# Patient Record
Sex: Male | Born: 1945 | Race: White | Hispanic: No | Marital: Married | Smoking: Former smoker
Health system: Southern US, Community
[De-identification: ages and names within clinical notes are randomized; demographics above are authoritative.]

## PROBLEM LIST (undated history)

## (undated) DIAGNOSIS — G43909 Migraine, unspecified, not intractable, without status migrainosus: Secondary | ICD-10-CM

## (undated) DIAGNOSIS — M1990 Unspecified osteoarthritis, unspecified site: Secondary | ICD-10-CM

## (undated) DIAGNOSIS — F32A Depression, unspecified: Secondary | ICD-10-CM

## (undated) DIAGNOSIS — F329 Major depressive disorder, single episode, unspecified: Secondary | ICD-10-CM

## (undated) DIAGNOSIS — Z87442 Personal history of urinary calculi: Secondary | ICD-10-CM

## (undated) DIAGNOSIS — E119 Type 2 diabetes mellitus without complications: Secondary | ICD-10-CM

## (undated) DIAGNOSIS — R51 Headache: Secondary | ICD-10-CM

## (undated) DIAGNOSIS — J189 Pneumonia, unspecified organism: Secondary | ICD-10-CM

## (undated) DIAGNOSIS — K219 Gastro-esophageal reflux disease without esophagitis: Secondary | ICD-10-CM

## (undated) DIAGNOSIS — N183 Chronic kidney disease, stage 3 unspecified: Secondary | ICD-10-CM

## (undated) DIAGNOSIS — F419 Anxiety disorder, unspecified: Secondary | ICD-10-CM

## (undated) DIAGNOSIS — N189 Chronic kidney disease, unspecified: Secondary | ICD-10-CM

## (undated) HISTORY — DX: Migraine, unspecified, not intractable, without status migrainosus: G43.909

## (undated) HISTORY — DX: Type 2 diabetes mellitus without complications: E11.9

## (undated) HISTORY — PX: CHOLECYSTECTOMY: SHX55

## (undated) HISTORY — DX: Chronic kidney disease, stage 3 unspecified: N18.30

## (undated) HISTORY — PX: VASECTOMY: SHX75

## (undated) HISTORY — PX: OTHER SURGICAL HISTORY: SHX169

---

## 2000-10-21 ENCOUNTER — Encounter: Payer: Self-pay | Admitting: Urology

## 2000-10-21 ENCOUNTER — Ambulatory Visit (HOSPITAL_COMMUNITY): Admission: RE | Admit: 2000-10-21 | Discharge: 2000-10-21 | Payer: Self-pay | Admitting: Urology

## 2000-10-23 ENCOUNTER — Encounter: Payer: Self-pay | Admitting: Urology

## 2000-10-23 ENCOUNTER — Ambulatory Visit (HOSPITAL_COMMUNITY): Admission: RE | Admit: 2000-10-23 | Discharge: 2000-10-23 | Payer: Self-pay | Admitting: Urology

## 2010-06-14 ENCOUNTER — Inpatient Hospital Stay (HOSPITAL_COMMUNITY)
Admission: EM | Admit: 2010-06-14 | Discharge: 2010-06-18 | Payer: Self-pay | Source: Home / Self Care | Attending: Internal Medicine | Admitting: Internal Medicine

## 2010-06-15 ENCOUNTER — Encounter: Payer: Self-pay | Admitting: Internal Medicine

## 2010-09-17 LAB — CBC
HCT: 41.5 % (ref 39.0–52.0)
HCT: 43.1 % (ref 39.0–52.0)
HCT: 46.1 % (ref 39.0–52.0)
Hemoglobin: 13.5 g/dL (ref 13.0–17.0)
Hemoglobin: 13.7 g/dL (ref 13.0–17.0)
Hemoglobin: 15.2 g/dL (ref 13.0–17.0)
Hemoglobin: 15.7 g/dL (ref 13.0–17.0)
MCH: 29.7 pg (ref 26.0–34.0)
MCH: 29.7 pg (ref 26.0–34.0)
MCH: 30.3 pg (ref 26.0–34.0)
MCHC: 32.9 g/dL (ref 30.0–36.0)
MCHC: 32.9 g/dL (ref 30.0–36.0)
MCHC: 34.1 g/dL (ref 30.0–36.0)
MCHC: 34.2 g/dL (ref 30.0–36.0)
MCV: 89 fL (ref 78.0–100.0)
MCV: 90.2 fL (ref 78.0–100.0)
Platelets: 195 10*3/uL (ref 150–400)
RBC: 4.61 MIL/uL (ref 4.22–5.81)
RBC: 5.18 MIL/uL (ref 4.22–5.81)
RDW: 13.5 % (ref 11.5–15.5)
RDW: 13.6 % (ref 11.5–15.5)
RDW: 13.8 % (ref 11.5–15.5)
RDW: 13.8 % (ref 11.5–15.5)
WBC: 7.7 10*3/uL (ref 4.0–10.5)
WBC: 9.2 10*3/uL (ref 4.0–10.5)

## 2010-09-17 LAB — BASIC METABOLIC PANEL
BUN: 15 mg/dL (ref 6–23)
BUN: 16 mg/dL (ref 6–23)
CO2: 23 mEq/L (ref 19–32)
Calcium: 8.3 mg/dL — ABNORMAL LOW (ref 8.4–10.5)
Calcium: 8.8 mg/dL (ref 8.4–10.5)
Chloride: 105 mEq/L (ref 96–112)
Chloride: 105 mEq/L (ref 96–112)
GFR calc Af Amer: 54 mL/min — ABNORMAL LOW (ref >60)
GFR calc non Af Amer: 44 mL/min — ABNORMAL LOW (ref >60)
Glucose, Bld: 110 mg/dL — ABNORMAL HIGH (ref 70–99)
Glucose, Bld: 94 mg/dL (ref 70–99)
Potassium: 3.6 mEq/L (ref 3.5–5.1)
Potassium: 3.7 mEq/L (ref 3.5–5.1)
Sodium: 141 mEq/L (ref 135–145)
Sodium: 141 mEq/L (ref 135–145)

## 2010-09-17 LAB — HEPARIN LEVEL (UNFRACTIONATED)
Heparin Unfractionated: 0.22 IU/mL — ABNORMAL LOW (ref 0.30–0.70)
Heparin Unfractionated: 0.33 IU/mL (ref 0.30–0.70)
Heparin Unfractionated: 0.57 IU/mL (ref 0.30–0.70)

## 2010-09-17 LAB — DIFFERENTIAL
Basophils Relative: 1 % (ref 0–1)
Eosinophils Absolute: 0.3 10*3/uL (ref 0.0–0.7)
Lymphs Abs: 2 10*3/uL (ref 0.7–4.0)
Monocytes Absolute: 0.5 10*3/uL (ref 0.1–1.0)
Monocytes Relative: 7 % (ref 3–12)
Neutrophils Relative %: 63 % (ref 43–77)

## 2010-09-17 LAB — CARDIAC PANEL(CRET KIN+CKTOT+MB+TROPI)
Relative Index: INVALID (ref 0.0–2.5)
Total CK: 60 U/L (ref 7–232)
Total CK: 64 U/L (ref 7–232)
Troponin I: 0.02 ng/mL (ref 0.00–0.06)

## 2010-09-17 LAB — GLUCOSE, CAPILLARY
Glucose-Capillary: 112 mg/dL — ABNORMAL HIGH (ref 70–99)
Glucose-Capillary: 117 mg/dL — ABNORMAL HIGH (ref 70–99)
Glucose-Capillary: 124 mg/dL — ABNORMAL HIGH (ref 70–99)
Glucose-Capillary: 125 mg/dL — ABNORMAL HIGH (ref 70–99)
Glucose-Capillary: 146 mg/dL — ABNORMAL HIGH (ref 70–99)
Glucose-Capillary: 151 mg/dL — ABNORMAL HIGH (ref 70–99)
Glucose-Capillary: 171 mg/dL — ABNORMAL HIGH (ref 70–99)
Glucose-Capillary: 95 mg/dL (ref 70–99)

## 2010-09-17 LAB — COMPREHENSIVE METABOLIC PANEL
ALT: UNDETERMINED U/L (ref 0–53)
AST: UNDETERMINED U/L (ref 0–37)
Albumin: 4 g/dL (ref 3.5–5.2)
Alkaline Phosphatase: 64 U/L (ref 39–117)
Calcium: 9.2 mg/dL (ref 8.4–10.5)
GFR calc Af Amer: 53 mL/min — ABNORMAL LOW (ref >60)
Glucose, Bld: UNDETERMINED mg/dL (ref 70–99)
Potassium: 4.2 mEq/L (ref 3.5–5.1)
Sodium: 142 mEq/L (ref 135–145)
Total Protein: 7.3 g/dL (ref 6.0–8.3)

## 2010-09-17 LAB — CK TOTAL AND CKMB (NOT AT ARMC)
CK, MB: 0.9 ng/mL (ref 0.3–4.0)
Relative Index: INVALID (ref 0.0–2.5)
Total CK: 77 U/L (ref 7–232)

## 2010-09-17 LAB — HEMOGLOBIN A1C
Hgb A1c MFr Bld: 6.3 % — ABNORMAL HIGH (ref <5.7)
Mean Plasma Glucose: 134 mg/dL — ABNORMAL HIGH (ref <117)

## 2010-09-17 LAB — PROTIME-INR: INR: 0.98 (ref 0.00–1.49)

## 2011-07-08 DIAGNOSIS — Z8701 Personal history of pneumonia (recurrent): Secondary | ICD-10-CM

## 2011-07-08 HISTORY — DX: Personal history of pneumonia (recurrent): Z87.01

## 2011-09-12 DIAGNOSIS — M109 Gout, unspecified: Secondary | ICD-10-CM | POA: Diagnosis not present

## 2011-10-06 DIAGNOSIS — E7800 Pure hypercholesterolemia, unspecified: Secondary | ICD-10-CM | POA: Diagnosis not present

## 2011-10-06 DIAGNOSIS — IMO0001 Reserved for inherently not codable concepts without codable children: Secondary | ICD-10-CM | POA: Diagnosis not present

## 2011-10-10 DIAGNOSIS — E781 Pure hyperglyceridemia: Secondary | ICD-10-CM | POA: Diagnosis not present

## 2011-10-10 DIAGNOSIS — G619 Inflammatory polyneuropathy, unspecified: Secondary | ICD-10-CM | POA: Diagnosis not present

## 2011-10-10 DIAGNOSIS — G622 Polyneuropathy due to other toxic agents: Secondary | ICD-10-CM | POA: Diagnosis not present

## 2011-10-10 DIAGNOSIS — M1990 Unspecified osteoarthritis, unspecified site: Secondary | ICD-10-CM | POA: Diagnosis not present

## 2011-10-10 DIAGNOSIS — E109 Type 1 diabetes mellitus without complications: Secondary | ICD-10-CM | POA: Diagnosis not present

## 2011-10-10 DIAGNOSIS — E7800 Pure hypercholesterolemia, unspecified: Secondary | ICD-10-CM | POA: Diagnosis not present

## 2011-10-21 DIAGNOSIS — M6530 Trigger finger, unspecified finger: Secondary | ICD-10-CM | POA: Diagnosis not present

## 2011-10-21 DIAGNOSIS — L02219 Cutaneous abscess of trunk, unspecified: Secondary | ICD-10-CM | POA: Diagnosis not present

## 2011-10-21 DIAGNOSIS — L03319 Cellulitis of trunk, unspecified: Secondary | ICD-10-CM | POA: Diagnosis not present

## 2011-10-21 DIAGNOSIS — M1990 Unspecified osteoarthritis, unspecified site: Secondary | ICD-10-CM | POA: Diagnosis not present

## 2012-02-05 DIAGNOSIS — E7800 Pure hypercholesterolemia, unspecified: Secondary | ICD-10-CM | POA: Diagnosis not present

## 2012-02-05 DIAGNOSIS — N400 Enlarged prostate without lower urinary tract symptoms: Secondary | ICD-10-CM | POA: Diagnosis not present

## 2012-02-20 DIAGNOSIS — G619 Inflammatory polyneuropathy, unspecified: Secondary | ICD-10-CM | POA: Diagnosis not present

## 2012-02-20 DIAGNOSIS — E781 Pure hyperglyceridemia: Secondary | ICD-10-CM | POA: Diagnosis not present

## 2012-02-20 DIAGNOSIS — E7800 Pure hypercholesterolemia, unspecified: Secondary | ICD-10-CM | POA: Diagnosis not present

## 2012-03-02 DIAGNOSIS — E119 Type 2 diabetes mellitus without complications: Secondary | ICD-10-CM | POA: Diagnosis not present

## 2012-03-24 DIAGNOSIS — M1710 Unilateral primary osteoarthritis, unspecified knee: Secondary | ICD-10-CM | POA: Diagnosis not present

## 2012-03-24 DIAGNOSIS — M25569 Pain in unspecified knee: Secondary | ICD-10-CM | POA: Diagnosis not present

## 2012-03-30 DIAGNOSIS — M6750 Plica syndrome, unspecified knee: Secondary | ICD-10-CM | POA: Diagnosis not present

## 2012-03-30 DIAGNOSIS — M8560 Other cyst of bone, unspecified site: Secondary | ICD-10-CM | POA: Diagnosis not present

## 2012-03-30 DIAGNOSIS — M1120 Other chondrocalcinosis, unspecified site: Secondary | ICD-10-CM | POA: Diagnosis not present

## 2012-03-30 DIAGNOSIS — M25569 Pain in unspecified knee: Secondary | ICD-10-CM | POA: Diagnosis not present

## 2012-03-30 DIAGNOSIS — M898X9 Other specified disorders of bone, unspecified site: Secondary | ICD-10-CM | POA: Diagnosis not present

## 2012-04-01 DIAGNOSIS — M1710 Unilateral primary osteoarthritis, unspecified knee: Secondary | ICD-10-CM | POA: Diagnosis not present

## 2012-04-01 DIAGNOSIS — M23305 Other meniscus derangements, unspecified medial meniscus, unspecified knee: Secondary | ICD-10-CM | POA: Diagnosis not present

## 2012-04-01 DIAGNOSIS — M25569 Pain in unspecified knee: Secondary | ICD-10-CM | POA: Diagnosis not present

## 2012-04-01 DIAGNOSIS — M23302 Other meniscus derangements, unspecified lateral meniscus, unspecified knee: Secondary | ICD-10-CM | POA: Diagnosis not present

## 2012-04-05 DIAGNOSIS — Z79899 Other long term (current) drug therapy: Secondary | ICD-10-CM | POA: Diagnosis not present

## 2012-04-05 DIAGNOSIS — IMO0002 Reserved for concepts with insufficient information to code with codable children: Secondary | ICD-10-CM | POA: Diagnosis not present

## 2012-04-05 DIAGNOSIS — G589 Mononeuropathy, unspecified: Secondary | ICD-10-CM | POA: Diagnosis not present

## 2012-04-05 DIAGNOSIS — M23302 Other meniscus derangements, unspecified lateral meniscus, unspecified knee: Secondary | ICD-10-CM | POA: Diagnosis not present

## 2012-04-05 DIAGNOSIS — E785 Hyperlipidemia, unspecified: Secondary | ICD-10-CM | POA: Diagnosis not present

## 2012-04-05 DIAGNOSIS — K219 Gastro-esophageal reflux disease without esophagitis: Secondary | ICD-10-CM | POA: Diagnosis not present

## 2012-04-05 DIAGNOSIS — M23305 Other meniscus derangements, unspecified medial meniscus, unspecified knee: Secondary | ICD-10-CM | POA: Diagnosis not present

## 2012-04-05 DIAGNOSIS — M9420 Chondromalacia, unspecified site: Secondary | ICD-10-CM | POA: Diagnosis not present

## 2012-04-05 DIAGNOSIS — E119 Type 2 diabetes mellitus without complications: Secondary | ICD-10-CM | POA: Diagnosis not present

## 2012-04-06 DIAGNOSIS — M1710 Unilateral primary osteoarthritis, unspecified knee: Secondary | ICD-10-CM | POA: Diagnosis not present

## 2012-04-06 DIAGNOSIS — M23302 Other meniscus derangements, unspecified lateral meniscus, unspecified knee: Secondary | ICD-10-CM | POA: Diagnosis not present

## 2012-04-06 DIAGNOSIS — E785 Hyperlipidemia, unspecified: Secondary | ICD-10-CM | POA: Diagnosis not present

## 2012-04-06 DIAGNOSIS — E119 Type 2 diabetes mellitus without complications: Secondary | ICD-10-CM | POA: Diagnosis not present

## 2012-04-06 DIAGNOSIS — G589 Mononeuropathy, unspecified: Secondary | ICD-10-CM | POA: Diagnosis not present

## 2012-04-06 DIAGNOSIS — K219 Gastro-esophageal reflux disease without esophagitis: Secondary | ICD-10-CM | POA: Diagnosis not present

## 2012-04-06 DIAGNOSIS — M23305 Other meniscus derangements, unspecified medial meniscus, unspecified knee: Secondary | ICD-10-CM | POA: Diagnosis not present

## 2012-04-06 DIAGNOSIS — IMO0002 Reserved for concepts with insufficient information to code with codable children: Secondary | ICD-10-CM | POA: Diagnosis not present

## 2012-04-06 DIAGNOSIS — Z79899 Other long term (current) drug therapy: Secondary | ICD-10-CM | POA: Diagnosis not present

## 2012-04-06 DIAGNOSIS — S83289A Other tear of lateral meniscus, current injury, unspecified knee, initial encounter: Secondary | ICD-10-CM | POA: Diagnosis not present

## 2012-04-26 DIAGNOSIS — Z23 Encounter for immunization: Secondary | ICD-10-CM | POA: Diagnosis not present

## 2012-06-16 DIAGNOSIS — E7800 Pure hypercholesterolemia, unspecified: Secondary | ICD-10-CM | POA: Diagnosis not present

## 2012-06-22 DIAGNOSIS — G619 Inflammatory polyneuropathy, unspecified: Secondary | ICD-10-CM | POA: Diagnosis not present

## 2012-06-22 DIAGNOSIS — E7800 Pure hypercholesterolemia, unspecified: Secondary | ICD-10-CM | POA: Diagnosis not present

## 2012-06-22 DIAGNOSIS — E781 Pure hyperglyceridemia: Secondary | ICD-10-CM | POA: Diagnosis not present

## 2012-06-22 DIAGNOSIS — G622 Polyneuropathy due to other toxic agents: Secondary | ICD-10-CM | POA: Diagnosis not present

## 2012-10-20 DIAGNOSIS — E7800 Pure hypercholesterolemia, unspecified: Secondary | ICD-10-CM | POA: Diagnosis not present

## 2012-10-20 DIAGNOSIS — IMO0001 Reserved for inherently not codable concepts without codable children: Secondary | ICD-10-CM | POA: Diagnosis not present

## 2012-10-27 DIAGNOSIS — E781 Pure hyperglyceridemia: Secondary | ICD-10-CM | POA: Diagnosis not present

## 2012-10-27 DIAGNOSIS — G619 Inflammatory polyneuropathy, unspecified: Secondary | ICD-10-CM | POA: Diagnosis not present

## 2012-10-27 DIAGNOSIS — E7800 Pure hypercholesterolemia, unspecified: Secondary | ICD-10-CM | POA: Diagnosis not present

## 2012-10-27 DIAGNOSIS — G622 Polyneuropathy due to other toxic agents: Secondary | ICD-10-CM | POA: Diagnosis not present

## 2012-11-24 DIAGNOSIS — Z9889 Other specified postprocedural states: Secondary | ICD-10-CM | POA: Diagnosis not present

## 2012-11-24 DIAGNOSIS — M23305 Other meniscus derangements, unspecified medial meniscus, unspecified knee: Secondary | ICD-10-CM | POA: Diagnosis not present

## 2012-11-24 DIAGNOSIS — M25569 Pain in unspecified knee: Secondary | ICD-10-CM | POA: Diagnosis not present

## 2012-11-24 DIAGNOSIS — M25469 Effusion, unspecified knee: Secondary | ICD-10-CM | POA: Diagnosis not present

## 2013-02-25 DIAGNOSIS — E7800 Pure hypercholesterolemia, unspecified: Secondary | ICD-10-CM | POA: Diagnosis not present

## 2013-02-25 DIAGNOSIS — G619 Inflammatory polyneuropathy, unspecified: Secondary | ICD-10-CM | POA: Diagnosis not present

## 2013-03-01 DIAGNOSIS — N529 Male erectile dysfunction, unspecified: Secondary | ICD-10-CM | POA: Diagnosis not present

## 2013-03-01 DIAGNOSIS — E781 Pure hyperglyceridemia: Secondary | ICD-10-CM | POA: Diagnosis not present

## 2013-03-01 DIAGNOSIS — G619 Inflammatory polyneuropathy, unspecified: Secondary | ICD-10-CM | POA: Diagnosis not present

## 2013-03-01 DIAGNOSIS — E7800 Pure hypercholesterolemia, unspecified: Secondary | ICD-10-CM | POA: Diagnosis not present

## 2013-03-01 DIAGNOSIS — Z23 Encounter for immunization: Secondary | ICD-10-CM | POA: Diagnosis not present

## 2013-03-17 DIAGNOSIS — E119 Type 2 diabetes mellitus without complications: Secondary | ICD-10-CM | POA: Diagnosis not present

## 2013-03-31 DIAGNOSIS — M6530 Trigger finger, unspecified finger: Secondary | ICD-10-CM | POA: Diagnosis not present

## 2013-04-07 DIAGNOSIS — Z23 Encounter for immunization: Secondary | ICD-10-CM | POA: Diagnosis not present

## 2013-04-08 DIAGNOSIS — G589 Mononeuropathy, unspecified: Secondary | ICD-10-CM | POA: Diagnosis not present

## 2013-04-08 DIAGNOSIS — E785 Hyperlipidemia, unspecified: Secondary | ICD-10-CM | POA: Diagnosis not present

## 2013-04-08 DIAGNOSIS — M65839 Other synovitis and tenosynovitis, unspecified forearm: Secondary | ICD-10-CM | POA: Diagnosis not present

## 2013-04-08 DIAGNOSIS — K219 Gastro-esophageal reflux disease without esophagitis: Secondary | ICD-10-CM | POA: Diagnosis not present

## 2013-04-08 DIAGNOSIS — Z8261 Family history of arthritis: Secondary | ICD-10-CM | POA: Diagnosis not present

## 2013-04-08 DIAGNOSIS — Z7982 Long term (current) use of aspirin: Secondary | ICD-10-CM | POA: Diagnosis not present

## 2013-04-08 DIAGNOSIS — M6530 Trigger finger, unspecified finger: Secondary | ICD-10-CM | POA: Diagnosis not present

## 2013-04-08 DIAGNOSIS — Z79899 Other long term (current) drug therapy: Secondary | ICD-10-CM | POA: Diagnosis not present

## 2013-04-08 DIAGNOSIS — E119 Type 2 diabetes mellitus without complications: Secondary | ICD-10-CM | POA: Diagnosis not present

## 2013-04-08 DIAGNOSIS — Z8489 Family history of other specified conditions: Secondary | ICD-10-CM | POA: Diagnosis not present

## 2013-06-27 DIAGNOSIS — E7800 Pure hypercholesterolemia, unspecified: Secondary | ICD-10-CM | POA: Diagnosis not present

## 2013-06-27 DIAGNOSIS — M1990 Unspecified osteoarthritis, unspecified site: Secondary | ICD-10-CM | POA: Diagnosis not present

## 2013-07-04 DIAGNOSIS — E7800 Pure hypercholesterolemia, unspecified: Secondary | ICD-10-CM | POA: Diagnosis not present

## 2013-07-04 DIAGNOSIS — N529 Male erectile dysfunction, unspecified: Secondary | ICD-10-CM | POA: Diagnosis not present

## 2013-07-04 DIAGNOSIS — E781 Pure hyperglyceridemia: Secondary | ICD-10-CM | POA: Diagnosis not present

## 2013-07-04 DIAGNOSIS — G619 Inflammatory polyneuropathy, unspecified: Secondary | ICD-10-CM | POA: Diagnosis not present

## 2013-09-22 DIAGNOSIS — R05 Cough: Secondary | ICD-10-CM | POA: Diagnosis not present

## 2013-09-22 DIAGNOSIS — R509 Fever, unspecified: Secondary | ICD-10-CM | POA: Diagnosis not present

## 2013-09-22 DIAGNOSIS — J40 Bronchitis, not specified as acute or chronic: Secondary | ICD-10-CM | POA: Diagnosis not present

## 2013-09-22 DIAGNOSIS — R059 Cough, unspecified: Secondary | ICD-10-CM | POA: Diagnosis not present

## 2013-11-01 DIAGNOSIS — N183 Chronic kidney disease, stage 3 unspecified: Secondary | ICD-10-CM | POA: Diagnosis not present

## 2013-11-01 DIAGNOSIS — E7800 Pure hypercholesterolemia, unspecified: Secondary | ICD-10-CM | POA: Diagnosis not present

## 2013-11-01 DIAGNOSIS — K2100 Gastro-esophageal reflux disease with esophagitis, without bleeding: Secondary | ICD-10-CM | POA: Diagnosis not present

## 2013-11-01 DIAGNOSIS — E119 Type 2 diabetes mellitus without complications: Secondary | ICD-10-CM | POA: Diagnosis not present

## 2013-11-07 DIAGNOSIS — IMO0001 Reserved for inherently not codable concepts without codable children: Secondary | ICD-10-CM | POA: Diagnosis not present

## 2013-11-07 DIAGNOSIS — G619 Inflammatory polyneuropathy, unspecified: Secondary | ICD-10-CM | POA: Diagnosis not present

## 2013-11-07 DIAGNOSIS — E7800 Pure hypercholesterolemia, unspecified: Secondary | ICD-10-CM | POA: Diagnosis not present

## 2013-11-07 DIAGNOSIS — N183 Chronic kidney disease, stage 3 unspecified: Secondary | ICD-10-CM | POA: Diagnosis not present

## 2013-11-07 DIAGNOSIS — R109 Unspecified abdominal pain: Secondary | ICD-10-CM | POA: Diagnosis not present

## 2013-11-07 DIAGNOSIS — E781 Pure hyperglyceridemia: Secondary | ICD-10-CM | POA: Diagnosis not present

## 2013-12-06 DIAGNOSIS — R3989 Other symptoms and signs involving the genitourinary system: Secondary | ICD-10-CM | POA: Diagnosis not present

## 2013-12-06 DIAGNOSIS — R3915 Urgency of urination: Secondary | ICD-10-CM | POA: Diagnosis not present

## 2013-12-06 DIAGNOSIS — N419 Inflammatory disease of prostate, unspecified: Secondary | ICD-10-CM | POA: Diagnosis not present

## 2014-01-10 DIAGNOSIS — R339 Retention of urine, unspecified: Secondary | ICD-10-CM | POA: Diagnosis not present

## 2014-01-10 DIAGNOSIS — R3989 Other symptoms and signs involving the genitourinary system: Secondary | ICD-10-CM | POA: Diagnosis not present

## 2014-01-10 DIAGNOSIS — R3915 Urgency of urination: Secondary | ICD-10-CM | POA: Diagnosis not present

## 2014-01-24 DIAGNOSIS — N419 Inflammatory disease of prostate, unspecified: Secondary | ICD-10-CM | POA: Diagnosis not present

## 2014-02-14 DIAGNOSIS — R339 Retention of urine, unspecified: Secondary | ICD-10-CM | POA: Diagnosis not present

## 2014-02-14 DIAGNOSIS — R3989 Other symptoms and signs involving the genitourinary system: Secondary | ICD-10-CM | POA: Diagnosis not present

## 2014-02-14 DIAGNOSIS — R3915 Urgency of urination: Secondary | ICD-10-CM | POA: Diagnosis not present

## 2014-02-18 DIAGNOSIS — H60399 Other infective otitis externa, unspecified ear: Secondary | ICD-10-CM | POA: Diagnosis not present

## 2014-02-18 DIAGNOSIS — H6690 Otitis media, unspecified, unspecified ear: Secondary | ICD-10-CM | POA: Diagnosis not present

## 2014-03-03 DIAGNOSIS — H6690 Otitis media, unspecified, unspecified ear: Secondary | ICD-10-CM | POA: Diagnosis not present

## 2014-03-03 DIAGNOSIS — H60399 Other infective otitis externa, unspecified ear: Secondary | ICD-10-CM | POA: Diagnosis not present

## 2014-03-15 DIAGNOSIS — H40019 Open angle with borderline findings, low risk, unspecified eye: Secondary | ICD-10-CM | POA: Diagnosis not present

## 2014-03-20 DIAGNOSIS — E7800 Pure hypercholesterolemia, unspecified: Secondary | ICD-10-CM | POA: Diagnosis not present

## 2014-03-20 DIAGNOSIS — IMO0001 Reserved for inherently not codable concepts without codable children: Secondary | ICD-10-CM | POA: Diagnosis not present

## 2014-03-20 DIAGNOSIS — N183 Chronic kidney disease, stage 3 unspecified: Secondary | ICD-10-CM | POA: Diagnosis not present

## 2014-03-22 DIAGNOSIS — M6530 Trigger finger, unspecified finger: Secondary | ICD-10-CM | POA: Diagnosis not present

## 2014-03-28 DIAGNOSIS — E781 Pure hyperglyceridemia: Secondary | ICD-10-CM | POA: Diagnosis not present

## 2014-03-28 DIAGNOSIS — G622 Polyneuropathy due to other toxic agents: Secondary | ICD-10-CM | POA: Diagnosis not present

## 2014-03-28 DIAGNOSIS — G619 Inflammatory polyneuropathy, unspecified: Secondary | ICD-10-CM | POA: Diagnosis not present

## 2014-03-28 DIAGNOSIS — Z23 Encounter for immunization: Secondary | ICD-10-CM | POA: Diagnosis not present

## 2014-03-28 DIAGNOSIS — H409 Unspecified glaucoma: Secondary | ICD-10-CM | POA: Diagnosis not present

## 2014-03-28 DIAGNOSIS — E119 Type 2 diabetes mellitus without complications: Secondary | ICD-10-CM | POA: Diagnosis not present

## 2014-03-28 DIAGNOSIS — E7800 Pure hypercholesterolemia, unspecified: Secondary | ICD-10-CM | POA: Diagnosis not present

## 2014-03-28 DIAGNOSIS — G43919 Migraine, unspecified, intractable, without status migrainosus: Secondary | ICD-10-CM | POA: Diagnosis not present

## 2014-03-28 DIAGNOSIS — N183 Chronic kidney disease, stage 3 unspecified: Secondary | ICD-10-CM | POA: Diagnosis not present

## 2014-04-05 DIAGNOSIS — H2510 Age-related nuclear cataract, unspecified eye: Secondary | ICD-10-CM | POA: Diagnosis not present

## 2014-04-05 DIAGNOSIS — H25019 Cortical age-related cataract, unspecified eye: Secondary | ICD-10-CM | POA: Diagnosis not present

## 2014-04-05 DIAGNOSIS — E119 Type 2 diabetes mellitus without complications: Secondary | ICD-10-CM | POA: Diagnosis not present

## 2014-04-05 DIAGNOSIS — H409 Unspecified glaucoma: Secondary | ICD-10-CM | POA: Diagnosis not present

## 2014-04-05 DIAGNOSIS — H4011X0 Primary open-angle glaucoma, stage unspecified: Secondary | ICD-10-CM | POA: Diagnosis not present

## 2014-05-09 DIAGNOSIS — H2512 Age-related nuclear cataract, left eye: Secondary | ICD-10-CM | POA: Diagnosis not present

## 2014-06-09 DIAGNOSIS — H2511 Age-related nuclear cataract, right eye: Secondary | ICD-10-CM | POA: Diagnosis not present

## 2014-06-09 DIAGNOSIS — H25041 Posterior subcapsular polar age-related cataract, right eye: Secondary | ICD-10-CM | POA: Diagnosis not present

## 2014-06-09 DIAGNOSIS — H25011 Cortical age-related cataract, right eye: Secondary | ICD-10-CM | POA: Diagnosis not present

## 2014-06-27 DIAGNOSIS — H2511 Age-related nuclear cataract, right eye: Secondary | ICD-10-CM | POA: Diagnosis not present

## 2014-07-10 DIAGNOSIS — E119 Type 2 diabetes mellitus without complications: Secondary | ICD-10-CM | POA: Diagnosis not present

## 2014-07-10 DIAGNOSIS — E780 Pure hypercholesterolemia: Secondary | ICD-10-CM | POA: Diagnosis not present

## 2014-07-10 DIAGNOSIS — E781 Pure hyperglyceridemia: Secondary | ICD-10-CM | POA: Diagnosis not present

## 2014-07-10 DIAGNOSIS — K210 Gastro-esophageal reflux disease with esophagitis: Secondary | ICD-10-CM | POA: Diagnosis not present

## 2014-07-10 DIAGNOSIS — N183 Chronic kidney disease, stage 3 (moderate): Secondary | ICD-10-CM | POA: Diagnosis not present

## 2014-07-17 DIAGNOSIS — E1140 Type 2 diabetes mellitus with diabetic neuropathy, unspecified: Secondary | ICD-10-CM | POA: Diagnosis not present

## 2014-07-17 DIAGNOSIS — Z1389 Encounter for screening for other disorder: Secondary | ICD-10-CM | POA: Diagnosis not present

## 2014-07-17 DIAGNOSIS — E782 Mixed hyperlipidemia: Secondary | ICD-10-CM | POA: Diagnosis not present

## 2014-07-17 DIAGNOSIS — N183 Chronic kidney disease, stage 3 (moderate): Secondary | ICD-10-CM | POA: Diagnosis not present

## 2014-07-17 DIAGNOSIS — K210 Gastro-esophageal reflux disease with esophagitis: Secondary | ICD-10-CM | POA: Diagnosis not present

## 2014-07-17 DIAGNOSIS — E1165 Type 2 diabetes mellitus with hyperglycemia: Secondary | ICD-10-CM | POA: Diagnosis not present

## 2014-07-31 DIAGNOSIS — M65351 Trigger finger, right little finger: Secondary | ICD-10-CM | POA: Diagnosis not present

## 2014-07-31 DIAGNOSIS — M65332 Trigger finger, left middle finger: Secondary | ICD-10-CM | POA: Diagnosis not present

## 2014-08-18 DIAGNOSIS — R3915 Urgency of urination: Secondary | ICD-10-CM | POA: Diagnosis not present

## 2014-08-18 DIAGNOSIS — R339 Retention of urine, unspecified: Secondary | ICD-10-CM | POA: Diagnosis not present

## 2014-08-18 DIAGNOSIS — N401 Enlarged prostate with lower urinary tract symptoms: Secondary | ICD-10-CM | POA: Diagnosis not present

## 2014-09-06 DIAGNOSIS — M65332 Trigger finger, left middle finger: Secondary | ICD-10-CM | POA: Diagnosis not present

## 2014-09-19 DIAGNOSIS — Z8261 Family history of arthritis: Secondary | ICD-10-CM | POA: Diagnosis not present

## 2014-09-19 DIAGNOSIS — E119 Type 2 diabetes mellitus without complications: Secondary | ICD-10-CM | POA: Diagnosis not present

## 2014-09-19 DIAGNOSIS — M65332 Trigger finger, left middle finger: Secondary | ICD-10-CM | POA: Diagnosis not present

## 2014-09-19 DIAGNOSIS — M1990 Unspecified osteoarthritis, unspecified site: Secondary | ICD-10-CM | POA: Diagnosis not present

## 2014-09-19 DIAGNOSIS — Z8249 Family history of ischemic heart disease and other diseases of the circulatory system: Secondary | ICD-10-CM | POA: Diagnosis not present

## 2014-09-19 DIAGNOSIS — Z79899 Other long term (current) drug therapy: Secondary | ICD-10-CM | POA: Diagnosis not present

## 2014-09-19 DIAGNOSIS — K219 Gastro-esophageal reflux disease without esophagitis: Secondary | ICD-10-CM | POA: Diagnosis not present

## 2014-09-19 DIAGNOSIS — F329 Major depressive disorder, single episode, unspecified: Secondary | ICD-10-CM | POA: Diagnosis not present

## 2014-09-19 DIAGNOSIS — G629 Polyneuropathy, unspecified: Secondary | ICD-10-CM | POA: Diagnosis not present

## 2014-09-19 DIAGNOSIS — G43909 Migraine, unspecified, not intractable, without status migrainosus: Secondary | ICD-10-CM | POA: Diagnosis not present

## 2014-09-19 DIAGNOSIS — M65842 Other synovitis and tenosynovitis, left hand: Secondary | ICD-10-CM | POA: Diagnosis not present

## 2014-09-19 DIAGNOSIS — Z87442 Personal history of urinary calculi: Secondary | ICD-10-CM | POA: Diagnosis not present

## 2014-09-19 DIAGNOSIS — Z7982 Long term (current) use of aspirin: Secondary | ICD-10-CM | POA: Diagnosis not present

## 2014-09-19 DIAGNOSIS — E669 Obesity, unspecified: Secondary | ICD-10-CM | POA: Diagnosis not present

## 2014-09-19 DIAGNOSIS — E780 Pure hypercholesterolemia: Secondary | ICD-10-CM | POA: Diagnosis not present

## 2014-09-19 DIAGNOSIS — F419 Anxiety disorder, unspecified: Secondary | ICD-10-CM | POA: Diagnosis not present

## 2014-09-19 DIAGNOSIS — Z6831 Body mass index (BMI) 31.0-31.9, adult: Secondary | ICD-10-CM | POA: Diagnosis not present

## 2014-10-23 DIAGNOSIS — J189 Pneumonia, unspecified organism: Secondary | ICD-10-CM | POA: Diagnosis not present

## 2014-11-06 DIAGNOSIS — E1140 Type 2 diabetes mellitus with diabetic neuropathy, unspecified: Secondary | ICD-10-CM | POA: Diagnosis not present

## 2014-11-06 DIAGNOSIS — E1165 Type 2 diabetes mellitus with hyperglycemia: Secondary | ICD-10-CM | POA: Diagnosis not present

## 2014-11-06 DIAGNOSIS — E782 Mixed hyperlipidemia: Secondary | ICD-10-CM | POA: Diagnosis not present

## 2014-11-06 DIAGNOSIS — N183 Chronic kidney disease, stage 3 (moderate): Secondary | ICD-10-CM | POA: Diagnosis not present

## 2014-11-06 DIAGNOSIS — K210 Gastro-esophageal reflux disease with esophagitis: Secondary | ICD-10-CM | POA: Diagnosis not present

## 2014-11-06 DIAGNOSIS — E781 Pure hyperglyceridemia: Secondary | ICD-10-CM | POA: Diagnosis not present

## 2014-11-06 DIAGNOSIS — E780 Pure hypercholesterolemia: Secondary | ICD-10-CM | POA: Diagnosis not present

## 2014-11-13 DIAGNOSIS — E1140 Type 2 diabetes mellitus with diabetic neuropathy, unspecified: Secondary | ICD-10-CM | POA: Diagnosis not present

## 2014-11-13 DIAGNOSIS — N183 Chronic kidney disease, stage 3 (moderate): Secondary | ICD-10-CM | POA: Diagnosis not present

## 2014-11-13 DIAGNOSIS — K210 Gastro-esophageal reflux disease with esophagitis: Secondary | ICD-10-CM | POA: Diagnosis not present

## 2014-11-13 DIAGNOSIS — E782 Mixed hyperlipidemia: Secondary | ICD-10-CM | POA: Diagnosis not present

## 2014-11-13 DIAGNOSIS — E1165 Type 2 diabetes mellitus with hyperglycemia: Secondary | ICD-10-CM | POA: Diagnosis not present

## 2014-11-15 DIAGNOSIS — N183 Chronic kidney disease, stage 3 (moderate): Secondary | ICD-10-CM | POA: Diagnosis not present

## 2014-11-15 DIAGNOSIS — E1165 Type 2 diabetes mellitus with hyperglycemia: Secondary | ICD-10-CM | POA: Diagnosis not present

## 2014-12-16 DIAGNOSIS — N183 Chronic kidney disease, stage 3 (moderate): Secondary | ICD-10-CM | POA: Diagnosis not present

## 2015-01-15 DIAGNOSIS — M1990 Unspecified osteoarthritis, unspecified site: Secondary | ICD-10-CM | POA: Diagnosis not present

## 2015-02-07 DIAGNOSIS — R3915 Urgency of urination: Secondary | ICD-10-CM | POA: Diagnosis not present

## 2015-02-07 DIAGNOSIS — R339 Retention of urine, unspecified: Secondary | ICD-10-CM | POA: Diagnosis not present

## 2015-02-07 DIAGNOSIS — N5203 Combined arterial insufficiency and corporo-venous occlusive erectile dysfunction: Secondary | ICD-10-CM | POA: Diagnosis not present

## 2015-02-15 DIAGNOSIS — M1990 Unspecified osteoarthritis, unspecified site: Secondary | ICD-10-CM | POA: Diagnosis not present

## 2015-03-18 DIAGNOSIS — I10 Essential (primary) hypertension: Secondary | ICD-10-CM | POA: Diagnosis not present

## 2015-03-30 DIAGNOSIS — K210 Gastro-esophageal reflux disease with esophagitis: Secondary | ICD-10-CM | POA: Diagnosis not present

## 2015-03-30 DIAGNOSIS — N183 Chronic kidney disease, stage 3 (moderate): Secondary | ICD-10-CM | POA: Diagnosis not present

## 2015-03-30 DIAGNOSIS — E1165 Type 2 diabetes mellitus with hyperglycemia: Secondary | ICD-10-CM | POA: Diagnosis not present

## 2015-03-30 DIAGNOSIS — E782 Mixed hyperlipidemia: Secondary | ICD-10-CM | POA: Diagnosis not present

## 2015-04-03 DIAGNOSIS — Z23 Encounter for immunization: Secondary | ICD-10-CM | POA: Diagnosis not present

## 2015-04-03 DIAGNOSIS — E1122 Type 2 diabetes mellitus with diabetic chronic kidney disease: Secondary | ICD-10-CM | POA: Diagnosis not present

## 2015-04-03 DIAGNOSIS — E782 Mixed hyperlipidemia: Secondary | ICD-10-CM | POA: Diagnosis not present

## 2015-04-03 DIAGNOSIS — E1165 Type 2 diabetes mellitus with hyperglycemia: Secondary | ICD-10-CM | POA: Diagnosis not present

## 2015-04-03 DIAGNOSIS — K210 Gastro-esophageal reflux disease with esophagitis: Secondary | ICD-10-CM | POA: Diagnosis not present

## 2015-04-03 DIAGNOSIS — N183 Chronic kidney disease, stage 3 (moderate): Secondary | ICD-10-CM | POA: Diagnosis not present

## 2015-04-03 DIAGNOSIS — E1140 Type 2 diabetes mellitus with diabetic neuropathy, unspecified: Secondary | ICD-10-CM | POA: Diagnosis not present

## 2015-04-17 DIAGNOSIS — F419 Anxiety disorder, unspecified: Secondary | ICD-10-CM | POA: Diagnosis not present

## 2015-05-18 DIAGNOSIS — E119 Type 2 diabetes mellitus without complications: Secondary | ICD-10-CM | POA: Diagnosis not present

## 2015-05-18 DIAGNOSIS — F419 Anxiety disorder, unspecified: Secondary | ICD-10-CM | POA: Diagnosis not present

## 2015-06-17 DIAGNOSIS — E119 Type 2 diabetes mellitus without complications: Secondary | ICD-10-CM | POA: Diagnosis not present

## 2015-07-24 DIAGNOSIS — N183 Chronic kidney disease, stage 3 (moderate): Secondary | ICD-10-CM | POA: Diagnosis not present

## 2015-07-24 DIAGNOSIS — K210 Gastro-esophageal reflux disease with esophagitis: Secondary | ICD-10-CM | POA: Diagnosis not present

## 2015-07-24 DIAGNOSIS — E7800 Pure hypercholesterolemia, unspecified: Secondary | ICD-10-CM | POA: Diagnosis not present

## 2015-07-24 DIAGNOSIS — E1122 Type 2 diabetes mellitus with diabetic chronic kidney disease: Secondary | ICD-10-CM | POA: Diagnosis not present

## 2015-07-24 DIAGNOSIS — E782 Mixed hyperlipidemia: Secondary | ICD-10-CM | POA: Diagnosis not present

## 2015-07-31 DIAGNOSIS — E1140 Type 2 diabetes mellitus with diabetic neuropathy, unspecified: Secondary | ICD-10-CM | POA: Diagnosis not present

## 2015-07-31 DIAGNOSIS — G43009 Migraine without aura, not intractable, without status migrainosus: Secondary | ICD-10-CM | POA: Diagnosis not present

## 2015-07-31 DIAGNOSIS — E1165 Type 2 diabetes mellitus with hyperglycemia: Secondary | ICD-10-CM | POA: Diagnosis not present

## 2015-07-31 DIAGNOSIS — E782 Mixed hyperlipidemia: Secondary | ICD-10-CM | POA: Diagnosis not present

## 2015-07-31 DIAGNOSIS — N183 Chronic kidney disease, stage 3 (moderate): Secondary | ICD-10-CM | POA: Diagnosis not present

## 2015-07-31 DIAGNOSIS — K210 Gastro-esophageal reflux disease with esophagitis: Secondary | ICD-10-CM | POA: Diagnosis not present

## 2015-07-31 DIAGNOSIS — E1122 Type 2 diabetes mellitus with diabetic chronic kidney disease: Secondary | ICD-10-CM | POA: Diagnosis not present

## 2015-08-21 DIAGNOSIS — N401 Enlarged prostate with lower urinary tract symptoms: Secondary | ICD-10-CM | POA: Diagnosis not present

## 2015-11-26 DIAGNOSIS — K210 Gastro-esophageal reflux disease with esophagitis: Secondary | ICD-10-CM | POA: Diagnosis not present

## 2015-11-26 DIAGNOSIS — E781 Pure hyperglyceridemia: Secondary | ICD-10-CM | POA: Diagnosis not present

## 2015-11-26 DIAGNOSIS — Z205 Contact with and (suspected) exposure to viral hepatitis: Secondary | ICD-10-CM | POA: Diagnosis not present

## 2015-11-26 DIAGNOSIS — E782 Mixed hyperlipidemia: Secondary | ICD-10-CM | POA: Diagnosis not present

## 2015-11-26 DIAGNOSIS — E1122 Type 2 diabetes mellitus with diabetic chronic kidney disease: Secondary | ICD-10-CM | POA: Diagnosis not present

## 2015-11-26 DIAGNOSIS — E7800 Pure hypercholesterolemia, unspecified: Secondary | ICD-10-CM | POA: Diagnosis not present

## 2015-11-26 DIAGNOSIS — Z0000 Encounter for general adult medical examination without abnormal findings: Secondary | ICD-10-CM | POA: Diagnosis not present

## 2015-11-26 DIAGNOSIS — N183 Chronic kidney disease, stage 3 (moderate): Secondary | ICD-10-CM | POA: Diagnosis not present

## 2015-11-29 DIAGNOSIS — E1140 Type 2 diabetes mellitus with diabetic neuropathy, unspecified: Secondary | ICD-10-CM | POA: Diagnosis not present

## 2015-11-29 DIAGNOSIS — G43009 Migraine without aura, not intractable, without status migrainosus: Secondary | ICD-10-CM | POA: Diagnosis not present

## 2015-11-29 DIAGNOSIS — E1165 Type 2 diabetes mellitus with hyperglycemia: Secondary | ICD-10-CM | POA: Diagnosis not present

## 2015-11-29 DIAGNOSIS — N183 Chronic kidney disease, stage 3 (moderate): Secondary | ICD-10-CM | POA: Diagnosis not present

## 2015-11-29 DIAGNOSIS — E1122 Type 2 diabetes mellitus with diabetic chronic kidney disease: Secondary | ICD-10-CM | POA: Diagnosis not present

## 2015-11-29 DIAGNOSIS — K210 Gastro-esophageal reflux disease with esophagitis: Secondary | ICD-10-CM | POA: Diagnosis not present

## 2015-11-29 DIAGNOSIS — Z1389 Encounter for screening for other disorder: Secondary | ICD-10-CM | POA: Diagnosis not present

## 2015-11-29 DIAGNOSIS — E782 Mixed hyperlipidemia: Secondary | ICD-10-CM | POA: Diagnosis not present

## 2016-02-06 DIAGNOSIS — H524 Presbyopia: Secondary | ICD-10-CM | POA: Diagnosis not present

## 2016-02-06 DIAGNOSIS — H401131 Primary open-angle glaucoma, bilateral, mild stage: Secondary | ICD-10-CM | POA: Diagnosis not present

## 2016-02-19 DIAGNOSIS — R3915 Urgency of urination: Secondary | ICD-10-CM | POA: Diagnosis not present

## 2016-02-19 DIAGNOSIS — R339 Retention of urine, unspecified: Secondary | ICD-10-CM | POA: Diagnosis not present

## 2016-02-19 DIAGNOSIS — N401 Enlarged prostate with lower urinary tract symptoms: Secondary | ICD-10-CM | POA: Diagnosis not present

## 2016-03-17 DIAGNOSIS — R310 Gross hematuria: Secondary | ICD-10-CM | POA: Diagnosis not present

## 2016-03-17 DIAGNOSIS — R109 Unspecified abdominal pain: Secondary | ICD-10-CM | POA: Diagnosis not present

## 2016-03-20 DIAGNOSIS — N261 Atrophy of kidney (terminal): Secondary | ICD-10-CM | POA: Diagnosis not present

## 2016-03-20 DIAGNOSIS — R109 Unspecified abdominal pain: Secondary | ICD-10-CM | POA: Diagnosis not present

## 2016-03-20 DIAGNOSIS — R310 Gross hematuria: Secondary | ICD-10-CM | POA: Diagnosis not present

## 2016-03-20 DIAGNOSIS — N132 Hydronephrosis with renal and ureteral calculous obstruction: Secondary | ICD-10-CM | POA: Diagnosis not present

## 2016-03-20 DIAGNOSIS — I700 Atherosclerosis of aorta: Secondary | ICD-10-CM | POA: Diagnosis not present

## 2016-03-20 DIAGNOSIS — N201 Calculus of ureter: Secondary | ICD-10-CM | POA: Diagnosis not present

## 2016-03-20 DIAGNOSIS — K449 Diaphragmatic hernia without obstruction or gangrene: Secondary | ICD-10-CM | POA: Diagnosis not present

## 2016-03-24 DIAGNOSIS — N201 Calculus of ureter: Secondary | ICD-10-CM | POA: Diagnosis not present

## 2016-03-24 DIAGNOSIS — R310 Gross hematuria: Secondary | ICD-10-CM | POA: Diagnosis not present

## 2016-03-27 DIAGNOSIS — E785 Hyperlipidemia, unspecified: Secondary | ICD-10-CM | POA: Diagnosis not present

## 2016-03-27 DIAGNOSIS — N132 Hydronephrosis with renal and ureteral calculous obstruction: Secondary | ICD-10-CM | POA: Diagnosis not present

## 2016-03-27 DIAGNOSIS — Z820 Family history of epilepsy and other diseases of the nervous system: Secondary | ICD-10-CM | POA: Diagnosis not present

## 2016-03-27 DIAGNOSIS — Z7984 Long term (current) use of oral hypoglycemic drugs: Secondary | ICD-10-CM | POA: Diagnosis not present

## 2016-03-27 DIAGNOSIS — Z7982 Long term (current) use of aspirin: Secondary | ICD-10-CM | POA: Diagnosis not present

## 2016-03-27 DIAGNOSIS — N1330 Unspecified hydronephrosis: Secondary | ICD-10-CM | POA: Diagnosis not present

## 2016-03-27 DIAGNOSIS — Z823 Family history of stroke: Secondary | ICD-10-CM | POA: Diagnosis not present

## 2016-03-27 DIAGNOSIS — Z79899 Other long term (current) drug therapy: Secondary | ICD-10-CM | POA: Diagnosis not present

## 2016-03-27 DIAGNOSIS — Z87891 Personal history of nicotine dependence: Secondary | ICD-10-CM | POA: Diagnosis not present

## 2016-03-27 DIAGNOSIS — Z801 Family history of malignant neoplasm of trachea, bronchus and lung: Secondary | ICD-10-CM | POA: Diagnosis not present

## 2016-03-27 DIAGNOSIS — E119 Type 2 diabetes mellitus without complications: Secondary | ICD-10-CM | POA: Diagnosis not present

## 2016-03-27 DIAGNOSIS — K219 Gastro-esophageal reflux disease without esophagitis: Secondary | ICD-10-CM | POA: Diagnosis not present

## 2016-03-27 DIAGNOSIS — R310 Gross hematuria: Secondary | ICD-10-CM | POA: Diagnosis not present

## 2016-03-27 DIAGNOSIS — E669 Obesity, unspecified: Secondary | ICD-10-CM | POA: Diagnosis not present

## 2016-03-27 DIAGNOSIS — Z6831 Body mass index (BMI) 31.0-31.9, adult: Secondary | ICD-10-CM | POA: Diagnosis not present

## 2016-03-27 DIAGNOSIS — F329 Major depressive disorder, single episode, unspecified: Secondary | ICD-10-CM | POA: Diagnosis not present

## 2016-03-27 DIAGNOSIS — Z87442 Personal history of urinary calculi: Secondary | ICD-10-CM | POA: Diagnosis not present

## 2016-03-27 DIAGNOSIS — N201 Calculus of ureter: Secondary | ICD-10-CM | POA: Diagnosis not present

## 2016-03-27 DIAGNOSIS — Z8249 Family history of ischemic heart disease and other diseases of the circulatory system: Secondary | ICD-10-CM | POA: Diagnosis not present

## 2016-04-14 DIAGNOSIS — E7800 Pure hypercholesterolemia, unspecified: Secondary | ICD-10-CM | POA: Diagnosis not present

## 2016-04-14 DIAGNOSIS — E1122 Type 2 diabetes mellitus with diabetic chronic kidney disease: Secondary | ICD-10-CM | POA: Diagnosis not present

## 2016-04-14 DIAGNOSIS — E781 Pure hyperglyceridemia: Secondary | ICD-10-CM | POA: Diagnosis not present

## 2016-04-14 DIAGNOSIS — K210 Gastro-esophageal reflux disease with esophagitis: Secondary | ICD-10-CM | POA: Diagnosis not present

## 2016-04-14 DIAGNOSIS — E782 Mixed hyperlipidemia: Secondary | ICD-10-CM | POA: Diagnosis not present

## 2016-04-18 DIAGNOSIS — E1140 Type 2 diabetes mellitus with diabetic neuropathy, unspecified: Secondary | ICD-10-CM | POA: Diagnosis not present

## 2016-04-18 DIAGNOSIS — K210 Gastro-esophageal reflux disease with esophagitis: Secondary | ICD-10-CM | POA: Diagnosis not present

## 2016-04-18 DIAGNOSIS — N183 Chronic kidney disease, stage 3 (moderate): Secondary | ICD-10-CM | POA: Diagnosis not present

## 2016-04-18 DIAGNOSIS — Z23 Encounter for immunization: Secondary | ICD-10-CM | POA: Diagnosis not present

## 2016-04-18 DIAGNOSIS — E1122 Type 2 diabetes mellitus with diabetic chronic kidney disease: Secondary | ICD-10-CM | POA: Diagnosis not present

## 2016-04-18 DIAGNOSIS — E1165 Type 2 diabetes mellitus with hyperglycemia: Secondary | ICD-10-CM | POA: Diagnosis not present

## 2016-04-18 DIAGNOSIS — E782 Mixed hyperlipidemia: Secondary | ICD-10-CM | POA: Diagnosis not present

## 2016-04-18 DIAGNOSIS — Z6829 Body mass index (BMI) 29.0-29.9, adult: Secondary | ICD-10-CM | POA: Diagnosis not present

## 2016-05-01 ENCOUNTER — Other Ambulatory Visit: Payer: Self-pay | Admitting: Urology

## 2016-05-01 DIAGNOSIS — N261 Atrophy of kidney (terminal): Secondary | ICD-10-CM | POA: Diagnosis not present

## 2016-05-01 DIAGNOSIS — N202 Calculus of kidney with calculus of ureter: Secondary | ICD-10-CM | POA: Diagnosis not present

## 2016-05-01 DIAGNOSIS — N200 Calculus of kidney: Secondary | ICD-10-CM

## 2016-05-08 ENCOUNTER — Ambulatory Visit (HOSPITAL_COMMUNITY)
Admission: RE | Admit: 2016-05-08 | Discharge: 2016-05-08 | Disposition: A | Discharge status: Home / Self Care | Payer: Medicare Other | Source: Ambulatory Visit | Attending: Urology | Admitting: Urology

## 2016-05-08 DIAGNOSIS — N201 Calculus of ureter: Secondary | ICD-10-CM | POA: Diagnosis not present

## 2016-05-08 DIAGNOSIS — N200 Calculus of kidney: Secondary | ICD-10-CM | POA: Insufficient documentation

## 2016-05-08 DIAGNOSIS — N261 Atrophy of kidney (terminal): Secondary | ICD-10-CM | POA: Diagnosis not present

## 2016-05-08 MED ORDER — FUROSEMIDE 10 MG/ML IJ SOLN: 42.0000 mg | Freq: Once | INTRAMUSCULAR | Status: DC

## 2016-05-08 MED ORDER — FUROSEMIDE 10 MG/ML IJ SOLN
INTRAMUSCULAR | Status: AC
  Filled 2016-05-08: qty 8

## 2016-05-08 MED ORDER — TECHNETIUM TC 99M MERTIATIDE
5.5000 | Freq: Once | INTRAVENOUS | Status: AC | PRN
Start: 2016-05-08 — End: 2016-05-08
  Administered 2016-05-08: 5.5 via INTRAVENOUS

## 2016-05-14 ENCOUNTER — Other Ambulatory Visit: Payer: Self-pay | Admitting: Urology

## 2016-06-02 DIAGNOSIS — N202 Calculus of kidney with calculus of ureter: Secondary | ICD-10-CM | POA: Diagnosis not present

## 2016-06-12 NOTE — Patient Instructions (Addendum)
Chase Malone  06/12/2016   Your procedure is scheduled on: 06/20/2016    [facility]  [facility] 3[facility] floor to  Short Stay Center at   0900 AM.  Call this number if you have problems the morning of surgery [phone]   Remember: ONLY 1 PERSON MAY GO WITH YOU TO SHORT STAY TO GET  READY MORNING OF YOUR SURGERY.  Do not eat food or drink liquids :After Midnight. Follow a clear liquids diet the day before surgery.  Magnesium citrate- one bottle by 12 noon day before surgery.       Take these medicines the morning of surgery with A SIP OF WATER: zyrtec, Nexium, Prozac, Lyrica, Flomax, Timolol eye drops  DO NOT TAKE ANY DIABETIC MEDICATIONS DAY OF YOUR SURGERY                               You may not have any metal on your body including hair pins and              piercings  Do not wear jewelry,  lotions, powders or perfumes, deodorant                        Men may shave face and neck.   Do not bring valuables to the hospital. [facility] IS NOT             RESPONSIBLE   FOR VALUABLES.  Contacts, dentures or bridgework may not be worn into surgery.  Leave suitcase in the car. After surgery it may be brought to your room.       How to Manage Your Diabetes Before and After Surgery  Why is it important to control my blood sugar before and after surgery? . Improving blood sugar levels before and after surgery helps healing and can limit problems. . A way of improving blood sugar control is eating a healthy diet by: o  Eating less sugar and carbohydrates o  Increasing activity/exercise o  Talking with your doctor about reaching your blood sugar goals . High blood sugars (greater than 180 mg/dL) can raise your risk of infections and slow your recovery, so you will need to focus on controlling your diabetes during the weeks before surgery. . Make sure that the doctor who takes care of your diabetes knows about your planned  surgery including the date and location.  How do I manage my blood sugar before surgery? . Check your blood sugar at least 4 times a day, starting 2 days before surgery, to make sure that the level is not too high or low. o Check your blood sugar the morning of your surgery when you wake up and every 2 hours until you get to the Short Stay unit. . If your blood sugar is less than 70 mg/dL, you will need to treat for low blood sugar: o Do not take insulin. o Treat a low blood sugar (less than 70 mg/dL) with  cup of clear juice (cranberry or apple), 4 glucose tablets, OR glucose gel. o Recheck blood sugar in 15 minutes after treatment (to make sure it is greater than 70 mg/dL). If your blood sugar is not greater than 70 mg/dL on recheck, call [phone] for further instructions. . Report your blood sugar to the  short stay nurse when you get to Short Stay.  . If you are admitted to the hospital after surgery: o Your blood sugar will be checked by the staff and you will probably be given insulin after surgery (instead of oral diabetes medicines) to make sure you have good blood sugar levels. o The goal for blood sugar control after surgery is 80-180 mg/dL.   WHAT DO I DO ABOUT MY DIABETES MEDICATION?  Marland Kitchen Do not take oral diabetes medicines (pills) the morning of surgery.  .  .     Patient Signature:  Date:   Nurse Signature:  Date:   Reviewed and Endorsed by [facility] Patient Education Committee, August 2015  Special Instructions: N/A              Please read over the following fact sheets you were given: _____________________________________________________________________             [facility] - Preparing for Surgery Before surgery, you can play an important role.  Because skin is not sterile, your skin needs to be as free of germs as possible.  You can reduce the number of germs on your skin by washing with CHG (chlorahexidine gluconate) soap before surgery.  CHG is an  antiseptic cleaner which kills germs and bonds with the skin to continue killing germs even after washing. Please DO NOT use if you have an allergy to CHG or antibacterial soaps.  If your skin becomes reddened/irritated stop using the CHG and inform your nurse when you arrive at Short Stay. Do not shave (including legs and underarms) for at least 48 hours prior to the first CHG shower.  You may shave your face/neck. Please follow these instructions carefully:  1.  Shower with CHG Soap the night before surgery and the  morning of Surgery.  2.  If you choose to wash your hair, wash your hair first as usual with your  normal  shampoo.  3.  After you shampoo, rinse your hair and body thoroughly to remove the  shampoo.                           4.  Use CHG as you would any other liquid soap.  You can apply chg directly  to the skin and wash                       Gently with a scrungie or clean washcloth.  5.  Apply the CHG Soap to your body ONLY FROM THE NECK DOWN.   Do not use on face/ open                           Wound or open sores. Avoid contact with eyes, ears mouth and genitals (private parts).                       Wash face,  Genitals (private parts) with your normal soap.             6.  Wash thoroughly, paying special attention to the area where your surgery  will be performed.  7.  Thoroughly rinse your body with warm water from the neck down.  8.  DO NOT shower/wash with your normal soap after using and rinsing off  the CHG Soap.  9.  Pat yourself dry with a clean towel.            10.  Wear clean pajamas.            11.  Place clean sheets on your bed the night of your first shower and do not  sleep with pets. Day of Surgery : Do not apply any lotions/deodorants the morning of surgery.  Please wear clean clothes to the hospital/surgery center.  FAILURE TO FOLLOW THESE INSTRUCTIONS MAY RESULT IN THE CANCELLATION OF YOUR SURGERY PATIENT  SIGNATURE_________________________________  NURSE SIGNATURE__________________________________  ________________________________________________________________________  WHAT IS A BLOOD TRANSFUSION? Blood Transfusion Information  A transfusion is the replacement of blood or some of its parts. Blood is made up of multiple cells which provide different functions.  Red blood cells carry oxygen and are used for blood loss replacement.  White blood cells fight against infection.  Platelets control bleeding.  Plasma helps clot blood.  Other blood products are available for specialized needs, such as hemophilia or other clotting disorders. BEFORE THE TRANSFUSION  Who gives blood for transfusions?   Healthy volunteers who are fully evaluated to make sure their blood is safe. This is blood bank blood. Transfusion therapy is the safest it has ever been in the practice of medicine. Before blood is taken from a donor, a complete history is taken to make sure that person has no history of diseases nor engages in risky social behavior (examples are intravenous drug use or sexual activity with multiple partners). The donor's travel history is screened to minimize risk of transmitting infections, such as malaria. The donated blood is tested for signs of infectious diseases, such as HIV and hepatitis. The blood is then tested to be sure it is compatible with you in order to minimize the chance of a transfusion reaction. If you or a relative donates blood, this is often done in anticipation of surgery and is not appropriate for emergency situations. It takes many days to process the donated blood. RISKS AND COMPLICATIONS Although transfusion therapy is very safe and saves many lives, the main dangers of transfusion include:   Getting an infectious disease.  Developing a transfusion reaction. This is an allergic reaction to something in the blood you were given. Every precaution is taken to prevent  this. The decision to have a blood transfusion has been considered carefully by your caregiver before blood is given. Blood is not given unless the benefits outweigh the risks. AFTER THE TRANSFUSION  Right after receiving a blood transfusion, you will usually feel much better and more energetic. This is especially true if your red blood cells have gotten low (anemic). The transfusion raises the level of the red blood cells which carry oxygen, and this usually causes an energy increase.  The nurse administering the transfusion will monitor you carefully for complications. HOME CARE INSTRUCTIONS  No special instructions are needed after a transfusion. You may find your energy is better. Speak with your caregiver about any limitations on activity for underlying diseases you may have. SEEK MEDICAL CARE IF:   Your condition is not improving after your transfusion.  You develop redness or irritation at the intravenous (IV) site. SEEK IMMEDIATE MEDICAL CARE IF:  Any of the following symptoms occur over the next 12 hours:  Shaking chills.  You have a temperature by mouth above 102 F (38.9 C), not controlled by medicine.  Chest, back, or muscle pain.  People around you feel you are not acting correctly or  are confused.  Shortness of breath or difficulty breathing.  Dizziness and fainting.  You get a rash or develop hives.  You have a decrease in urine output.  Your urine turns a dark color or changes to pink, red, or brown. Any of the following symptoms occur over the next 10 days:  You have a temperature by mouth above 102 F (38.9 C), not controlled by medicine.  Shortness of breath.  Weakness after normal activity.  The white part of the eye turns yellow (jaundice).  You have a decrease in the amount of urine or are urinating less often.  Your urine turns a dark color or changes to pink, red, or brown. Document Released: 06/20/2000 Document Revised: 09/15/2011 Document  Reviewed: 02/07/2008 ExitCare Patient Information 2014 [employer], [place].  _______________________________________________________________________   CLEAR LIQUID DIET   Foods Allowed                                                                     Foods Excluded  Coffee and tea, regular and decaf                             liquids that you cannot  Plain Jell-O in any flavor                                             see through such as: Fruit ices (not with fruit pulp)                                     milk, soups, orange juice  Iced Popsicles                                    All solid food Carbonated beverages, regular and diet                                    Cranberry, grape and apple juices Sports drinks like Gatorade Lightly seasoned clear broth or consume(fat free) Sugar, honey syrup  Sample Menu Breakfast                                Lunch                                     Supper Cranberry juice                    Beef broth                            Chicken broth Jell-O  Grape juice                           Apple juice Coffee or tea                        Jell-O                                      Popsicle                                                Coffee or tea                        Coffee or tea  _____________________________________________________________________

## 2016-06-16 ENCOUNTER — Encounter (HOSPITAL_COMMUNITY)
Admission: RE | Admit: 2016-06-16 | Discharge: 2016-06-16 | Disposition: A | Discharge status: Home / Self Care | Payer: Medicare Other | Source: Ambulatory Visit | Attending: Urology | Admitting: Urology

## 2016-06-16 ENCOUNTER — Encounter (HOSPITAL_COMMUNITY): Payer: Self-pay

## 2016-06-16 DIAGNOSIS — Z01810 Encounter for preprocedural cardiovascular examination: Secondary | ICD-10-CM | POA: Insufficient documentation

## 2016-06-16 DIAGNOSIS — E1122 Type 2 diabetes mellitus with diabetic chronic kidney disease: Secondary | ICD-10-CM | POA: Insufficient documentation

## 2016-06-16 DIAGNOSIS — N189 Chronic kidney disease, unspecified: Secondary | ICD-10-CM | POA: Insufficient documentation

## 2016-06-16 DIAGNOSIS — Z01812 Encounter for preprocedural laboratory examination: Secondary | ICD-10-CM | POA: Insufficient documentation

## 2016-06-16 DIAGNOSIS — N201 Calculus of ureter: Secondary | ICD-10-CM | POA: Diagnosis not present

## 2016-06-16 HISTORY — DX: Personal history of urinary calculi: Z87.442

## 2016-06-16 HISTORY — DX: Chronic kidney disease, unspecified: N18.9

## 2016-06-16 HISTORY — DX: Headache: R51

## 2016-06-16 HISTORY — DX: Type 2 diabetes mellitus without complications: E11.9

## 2016-06-16 HISTORY — DX: Pneumonia, unspecified organism: J18.9

## 2016-06-16 HISTORY — DX: Depression, unspecified: F32.A

## 2016-06-16 HISTORY — DX: Unspecified osteoarthritis, unspecified site: M19.90

## 2016-06-16 HISTORY — DX: Gastro-esophageal reflux disease without esophagitis: K21.9

## 2016-06-16 HISTORY — DX: Anxiety disorder, unspecified: F41.9

## 2016-06-16 HISTORY — DX: Major depressive disorder, single episode, unspecified: F32.9

## 2016-06-16 LAB — BASIC METABOLIC PANEL
Anion gap: 11 (ref 5–15)
BUN: 16 mg/dL (ref 6–20)
CO2: 22 mmol/L (ref 22–32)
CREATININE: 1.6 mg/dL — AB (ref 0.61–1.24)
Calcium: 9.2 mg/dL (ref 8.9–10.3)
Chloride: 103 mmol/L (ref 101–111)
GFR calc Af Amer: 48 mL/min — ABNORMAL LOW (ref >60)
GFR, EST NON AFRICAN AMERICAN: 41 mL/min — AB (ref >60)
Glucose, Bld: 264 mg/dL — ABNORMAL HIGH (ref 65–99)
Potassium: 4.4 mmol/L (ref 3.5–5.1)
SODIUM: 136 mmol/L (ref 135–145)

## 2016-06-16 LAB — CBC
HCT: 42.1 % (ref 39.0–52.0)
Hemoglobin: 13.9 g/dL (ref 13.0–17.0)
MCH: 29.3 pg (ref 26.0–34.0)
MCHC: 33 g/dL (ref 30.0–36.0)
MCV: 88.6 fL (ref 78.0–100.0)
PLATELETS: 202 10*3/uL (ref 150–400)
RBC: 4.75 MIL/uL (ref 4.22–5.81)
RDW: 14.5 % (ref 11.5–15.5)
WBC: 7.9 10*3/uL (ref 4.0–10.5)

## 2016-06-16 LAB — ABO/RH: ABO/RH(D): A POS

## 2016-06-16 LAB — GLUCOSE, CAPILLARY: Glucose-Capillary: 276 mg/dL — ABNORMAL HIGH (ref 65–99)

## 2016-06-16 NOTE — Progress Notes (Signed)
BMP done 06/16/16- faxed via EPIC to Dr Berneice Heinrichmanny.

## 2016-06-16 NOTE — Progress Notes (Signed)
04/18/16- LOV- 04/18/16- PCP- on chart

## 2016-06-16 NOTE — Progress Notes (Signed)
Final EKG done 06/16/16- EPIC  

## 2016-06-17 LAB — HEMOGLOBIN A1C
HEMOGLOBIN A1C: 6.5 % — AB (ref 4.8–5.6)
MEAN PLASMA GLUCOSE: 140 mg/dL

## 2016-06-20 ENCOUNTER — Inpatient Hospital Stay (HOSPITAL_COMMUNITY)
Admission: RE | Admit: 2016-06-20 | Discharge: 2016-06-22 | DRG: 661 | Disposition: A | Discharge status: Home / Self Care | Payer: Medicare Other | Source: Ambulatory Visit | Attending: Urology | Admitting: Urology

## 2016-06-20 ENCOUNTER — Encounter (HOSPITAL_COMMUNITY)
Admission: RE | Disposition: A | Discharge status: Home / Self Care | Payer: Self-pay | Source: Ambulatory Visit | Attending: Urology

## 2016-06-20 ENCOUNTER — Encounter (HOSPITAL_COMMUNITY): Payer: Self-pay | Admitting: *Deleted

## 2016-06-20 ENCOUNTER — Inpatient Hospital Stay (HOSPITAL_COMMUNITY): Payer: Medicare Other | Admitting: Anesthesiology

## 2016-06-20 DIAGNOSIS — N039 Chronic nephritic syndrome with unspecified morphologic changes: Secondary | ICD-10-CM | POA: Diagnosis not present

## 2016-06-20 DIAGNOSIS — N201 Calculus of ureter: Principal | ICD-10-CM | POA: Diagnosis present

## 2016-06-20 DIAGNOSIS — E119 Type 2 diabetes mellitus without complications: Secondary | ICD-10-CM | POA: Diagnosis not present

## 2016-06-20 DIAGNOSIS — F419 Anxiety disorder, unspecified: Secondary | ICD-10-CM | POA: Diagnosis present

## 2016-06-20 DIAGNOSIS — Z87442 Personal history of urinary calculi: Secondary | ICD-10-CM

## 2016-06-20 DIAGNOSIS — M1990 Unspecified osteoarthritis, unspecified site: Secondary | ICD-10-CM | POA: Diagnosis present

## 2016-06-20 DIAGNOSIS — N261 Atrophy of kidney (terminal): Secondary | ICD-10-CM | POA: Diagnosis present

## 2016-06-20 DIAGNOSIS — K219 Gastro-esophageal reflux disease without esophagitis: Secondary | ICD-10-CM | POA: Diagnosis present

## 2016-06-20 DIAGNOSIS — N183 Chronic kidney disease, stage 3 (moderate): Secondary | ICD-10-CM | POA: Diagnosis present

## 2016-06-20 DIAGNOSIS — E1122 Type 2 diabetes mellitus with diabetic chronic kidney disease: Secondary | ICD-10-CM | POA: Diagnosis present

## 2016-06-20 DIAGNOSIS — K660 Peritoneal adhesions (postprocedural) (postinfection): Secondary | ICD-10-CM | POA: Diagnosis present

## 2016-06-20 DIAGNOSIS — Z87891 Personal history of nicotine dependence: Secondary | ICD-10-CM

## 2016-06-20 HISTORY — PX: ROBOT ASSITED LAPAROSCOPIC NEPHROURETERECTOMY: SHX6077

## 2016-06-20 LAB — GLUCOSE, CAPILLARY
GLUCOSE-CAPILLARY: 208 mg/dL — AB (ref 65–99)
GLUCOSE-CAPILLARY: 243 mg/dL — AB (ref 65–99)
Glucose-Capillary: 156 mg/dL — ABNORMAL HIGH (ref 65–99)
Glucose-Capillary: 233 mg/dL — ABNORMAL HIGH (ref 65–99)

## 2016-06-20 LAB — HEMOGLOBIN AND HEMATOCRIT, BLOOD
HEMATOCRIT: 37.3 % — AB (ref 39.0–52.0)
Hemoglobin: 12.5 g/dL — ABNORMAL LOW (ref 13.0–17.0)

## 2016-06-20 LAB — TYPE AND SCREEN
ABO/RH(D): A POS
Antibody Screen: NEGATIVE

## 2016-06-20 SURGERY — NEPHROURETERECTOMY, ROBOT-ASSISTED, LAPAROSCOPIC
Anesthesia: General | Site: Abdomen | Laterality: Right

## 2016-06-20 MED ORDER — AMITRIPTYLINE HCL 10 MG PO TABS
10.0000 mg | ORAL_TABLET | Freq: Every day | ORAL | Status: DC
  Administered 2016-06-20 – 2016-06-21 (×2): 10 mg via ORAL
  Filled 2016-06-20 (×2): qty 1

## 2016-06-20 MED ORDER — EZETIMIBE-SIMVASTATIN 10-20 MG PO TABS
1.0000 | ORAL_TABLET | Freq: Every evening | ORAL | Status: DC
  Administered 2016-06-21: 1 via ORAL
  Filled 2016-06-20 (×2): qty 1

## 2016-06-20 MED ORDER — ACETAMINOPHEN 10 MG/ML IV SOLN
1000.0000 mg | Freq: Once | INTRAVENOUS | Status: AC
  Administered 2016-06-20: 1000 mg via INTRAVENOUS

## 2016-06-20 MED ORDER — PHENYLEPHRINE 40 MCG/ML (10ML) SYRINGE FOR IV PUSH (FOR BLOOD PRESSURE SUPPORT)
PREFILLED_SYRINGE | INTRAVENOUS | Status: AC
  Filled 2016-06-20: qty 10

## 2016-06-20 MED ORDER — FENTANYL CITRATE (PF) 250 MCG/5ML IJ SOLN
INTRAMUSCULAR | Status: DC | PRN
  Administered 2016-06-20: 50 ug via INTRAVENOUS
  Administered 2016-06-20 (×2): 25 ug via INTRAVENOUS

## 2016-06-20 MED ORDER — CLONAZEPAM 1 MG PO TABS
1.0000 mg | ORAL_TABLET | Freq: Every day | ORAL | Status: DC
  Administered 2016-06-20 – 2016-06-21 (×2): 1 mg via ORAL
  Filled 2016-06-20 (×2): qty 1

## 2016-06-20 MED ORDER — PHENYLEPHRINE HCL 10 MG/ML IJ SOLN
INTRAMUSCULAR | Status: AC
  Filled 2016-06-20: qty 1

## 2016-06-20 MED ORDER — DIPHENHYDRAMINE HCL 12.5 MG/5ML PO ELIX: 12.5000 mg | ORAL_SOLUTION | Freq: Four times a day (QID) | ORAL | Status: DC | PRN

## 2016-06-20 MED ORDER — CEFAZOLIN SODIUM-DEXTROSE 2-4 GM/100ML-% IV SOLN
INTRAVENOUS | Status: AC
  Filled 2016-06-20: qty 100

## 2016-06-20 MED ORDER — CEFAZOLIN SODIUM-DEXTROSE 2-4 GM/100ML-% IV SOLN
2.0000 g | INTRAVENOUS | Status: AC
  Administered 2016-06-20: 2 g via INTRAVENOUS
  Filled 2016-06-20: qty 100

## 2016-06-20 MED ORDER — ACETAMINOPHEN 10 MG/ML IV SOLN
INTRAVENOUS | Status: AC
  Filled 2016-06-20: qty 100

## 2016-06-20 MED ORDER — EPHEDRINE SULFATE 50 MG/ML IJ SOLN
INTRAMUSCULAR | Status: DC | PRN
  Administered 2016-06-20: 15 mg via INTRAVENOUS
  Administered 2016-06-20: 10 mg via INTRAVENOUS
  Administered 2016-06-20: 15 mg via INTRAVENOUS

## 2016-06-20 MED ORDER — PREGABALIN 75 MG PO CAPS
150.0000 mg | ORAL_CAPSULE | Freq: Every day | ORAL | Status: DC
  Administered 2016-06-20 – 2016-06-21 (×2): 150 mg via ORAL
  Filled 2016-06-20 (×2): qty 2

## 2016-06-20 MED ORDER — LACTATED RINGERS IV SOLN: INTRAVENOUS | Status: DC

## 2016-06-20 MED ORDER — DOCUSATE SODIUM 100 MG PO CAPS
100.0000 mg | ORAL_CAPSULE | Freq: Two times a day (BID) | ORAL | Status: DC
  Administered 2016-06-20 – 2016-06-22 (×4): 100 mg via ORAL
  Filled 2016-06-20 (×4): qty 1

## 2016-06-20 MED ORDER — OXYBUTYNIN CHLORIDE 5 MG PO TABS: 5.0000 mg | ORAL_TABLET | Freq: Three times a day (TID) | ORAL | Status: DC | PRN

## 2016-06-20 MED ORDER — EPHEDRINE 5 MG/ML INJ
INTRAVENOUS | Status: AC
  Filled 2016-06-20: qty 10

## 2016-06-20 MED ORDER — SODIUM CHLORIDE 0.9 % IJ SOLN
INTRAMUSCULAR | Status: AC
  Filled 2016-06-20: qty 50

## 2016-06-20 MED ORDER — ONDANSETRON HCL 4 MG/2ML IJ SOLN
INTRAMUSCULAR | Status: DC | PRN
  Administered 2016-06-20: 4 mg via INTRAVENOUS

## 2016-06-20 MED ORDER — ALBUMIN HUMAN 5 % IV SOLN
INTRAVENOUS | Status: AC
  Filled 2016-06-20: qty 250

## 2016-06-20 MED ORDER — ONDANSETRON HCL 4 MG/2ML IJ SOLN
INTRAMUSCULAR | Status: AC
  Filled 2016-06-20: qty 2

## 2016-06-20 MED ORDER — PHENYLEPHRINE HCL 10 MG/ML IJ SOLN
INTRAMUSCULAR | Status: DC | PRN
  Administered 2016-06-20: 40 ug via INTRAVENOUS

## 2016-06-20 MED ORDER — INSULIN ASPART 100 UNIT/ML ~~LOC~~ SOLN
0.0000 [IU] | Freq: Three times a day (TID) | SUBCUTANEOUS | Status: DC
  Administered 2016-06-20: 5 [IU] via SUBCUTANEOUS
  Administered 2016-06-21: 3 [IU] via SUBCUTANEOUS
  Administered 2016-06-21: 5 [IU] via SUBCUTANEOUS
  Administered 2016-06-21: 2 [IU] via SUBCUTANEOUS
  Administered 2016-06-22: 5 [IU] via SUBCUTANEOUS

## 2016-06-20 MED ORDER — FINASTERIDE 5 MG PO TABS
5.0000 mg | ORAL_TABLET | Freq: Every day | ORAL | Status: DC
  Administered 2016-06-21 – 2016-06-22 (×2): 5 mg via ORAL
  Filled 2016-06-20 (×2): qty 1

## 2016-06-20 MED ORDER — PANTOPRAZOLE SODIUM 40 MG PO TBEC
80.0000 mg | DELAYED_RELEASE_TABLET | Freq: Every day | ORAL | Status: DC
  Administered 2016-06-21 – 2016-06-22 (×2): 80 mg via ORAL
  Filled 2016-06-20 (×2): qty 2

## 2016-06-20 MED ORDER — PHENYLEPHRINE HCL 10 MG/ML IJ SOLN
INTRAVENOUS | Status: DC | PRN
  Administered 2016-06-20: 20 ug/min via INTRAVENOUS

## 2016-06-20 MED ORDER — SUCCINYLCHOLINE CHLORIDE 200 MG/10ML IV SOSY
PREFILLED_SYRINGE | INTRAVENOUS | Status: AC
  Filled 2016-06-20: qty 10

## 2016-06-20 MED ORDER — LIDOCAINE 2% (20 MG/ML) 5 ML SYRINGE
INTRAMUSCULAR | Status: AC
  Filled 2016-06-20: qty 5

## 2016-06-20 MED ORDER — OXYCODONE HCL 5 MG PO TABS: 5.0000 mg | ORAL_TABLET | Freq: Once | ORAL | Status: DC | PRN

## 2016-06-20 MED ORDER — DEXAMETHASONE SODIUM PHOSPHATE 10 MG/ML IJ SOLN
INTRAMUSCULAR | Status: DC | PRN
  Administered 2016-06-20: 10 mg via INTRAVENOUS

## 2016-06-20 MED ORDER — LACTATED RINGERS IV SOLN
INTRAVENOUS | Status: DC | PRN
  Administered 2016-06-20 (×2): via INTRAVENOUS

## 2016-06-20 MED ORDER — MORPHINE SULFATE (PF) 2 MG/ML IV SOLN
2.0000 mg | INTRAVENOUS | Status: DC | PRN
  Administered 2016-06-20: 2 mg via INTRAVENOUS
  Filled 2016-06-20: qty 1

## 2016-06-20 MED ORDER — SENNA 8.6 MG PO TABS
1.0000 | ORAL_TABLET | Freq: Two times a day (BID) | ORAL | Status: DC
  Administered 2016-06-20 – 2016-06-22 (×4): 8.6 mg via ORAL
  Filled 2016-06-20 (×4): qty 1

## 2016-06-20 MED ORDER — SUGAMMADEX SODIUM 200 MG/2ML IV SOLN
INTRAVENOUS | Status: DC | PRN
  Administered 2016-06-20: 200 mg via INTRAVENOUS

## 2016-06-20 MED ORDER — PROPOFOL 10 MG/ML IV BOLUS
INTRAVENOUS | Status: DC | PRN
  Administered 2016-06-20: 150 mg via INTRAVENOUS

## 2016-06-20 MED ORDER — SUGAMMADEX SODIUM 200 MG/2ML IV SOLN
INTRAVENOUS | Status: AC
  Filled 2016-06-20: qty 2

## 2016-06-20 MED ORDER — STERILE WATER FOR IRRIGATION IR SOLN
Status: DC | PRN
  Administered 2016-06-20: 1000 mL

## 2016-06-20 MED ORDER — BUPIVACAINE LIPOSOME 1.3 % IJ SUSP
INTRAMUSCULAR | Status: DC | PRN
  Administered 2016-06-20: 20 mL

## 2016-06-20 MED ORDER — DIPHENHYDRAMINE HCL 50 MG/ML IJ SOLN: 12.5000 mg | Freq: Four times a day (QID) | INTRAMUSCULAR | Status: DC | PRN

## 2016-06-20 MED ORDER — HYDROMORPHONE HCL 2 MG/ML IJ SOLN
INTRAMUSCULAR | Status: AC
  Filled 2016-06-20: qty 1

## 2016-06-20 MED ORDER — OXYCODONE-ACETAMINOPHEN 5-325 MG PO TABS
1.0000 | ORAL_TABLET | ORAL | Status: DC | PRN
  Administered 2016-06-21: 2 via ORAL
  Filled 2016-06-20: qty 2

## 2016-06-20 MED ORDER — ACETAMINOPHEN 325 MG PO TABS
650.0000 mg | ORAL_TABLET | ORAL | Status: DC | PRN
  Administered 2016-06-21: 650 mg via ORAL
  Filled 2016-06-20: qty 2

## 2016-06-20 MED ORDER — TAMSULOSIN HCL 0.4 MG PO CAPS
0.4000 mg | ORAL_CAPSULE | Freq: Two times a day (BID) | ORAL | Status: DC
  Administered 2016-06-20 – 2016-06-22 (×4): 0.4 mg via ORAL
  Filled 2016-06-20 (×4): qty 1

## 2016-06-20 MED ORDER — ROCURONIUM BROMIDE 50 MG/5ML IV SOSY
PREFILLED_SYRINGE | INTRAVENOUS | Status: DC | PRN
  Administered 2016-06-20 (×2): 10 mg via INTRAVENOUS
  Administered 2016-06-20: 20 mg via INTRAVENOUS
  Administered 2016-06-20: 10 mg via INTRAVENOUS
  Administered 2016-06-20: 30 mg via INTRAVENOUS

## 2016-06-20 MED ORDER — LIDOCAINE 2% (20 MG/ML) 5 ML SYRINGE
INTRAMUSCULAR | Status: DC | PRN
  Administered 2016-06-20: 80 mg via INTRAVENOUS

## 2016-06-20 MED ORDER — PROMETHAZINE HCL 25 MG/ML IJ SOLN: 6.2500 mg | INTRAMUSCULAR | Status: DC | PRN

## 2016-06-20 MED ORDER — FENTANYL CITRATE (PF) 250 MCG/5ML IJ SOLN
INTRAMUSCULAR | Status: AC
  Filled 2016-06-20: qty 5

## 2016-06-20 MED ORDER — ZOLPIDEM TARTRATE 5 MG PO TABS: 5.0000 mg | ORAL_TABLET | Freq: Every evening | ORAL | Status: DC | PRN

## 2016-06-20 MED ORDER — ROCURONIUM BROMIDE 50 MG/5ML IV SOSY
PREFILLED_SYRINGE | INTRAVENOUS | Status: AC
  Filled 2016-06-20: qty 5

## 2016-06-20 MED ORDER — INSULIN ASPART 100 UNIT/ML ~~LOC~~ SOLN
0.0000 [IU] | Freq: Every day | SUBCUTANEOUS | Status: DC
  Administered 2016-06-20: 2 [IU] via SUBCUTANEOUS

## 2016-06-20 MED ORDER — ONDANSETRON HCL 4 MG/2ML IJ SOLN: 4.0000 mg | INTRAMUSCULAR | Status: DC | PRN

## 2016-06-20 MED ORDER — SODIUM CHLORIDE 0.9 % IR SOLN
Status: DC | PRN
  Administered 2016-06-20: 1000 mL

## 2016-06-20 MED ORDER — DEXTROSE-NACL 5-0.45 % IV SOLN
INTRAVENOUS | Status: DC
  Administered 2016-06-20 – 2016-06-21 (×2): via INTRAVENOUS

## 2016-06-20 MED ORDER — OXYCODONE HCL 5 MG/5ML PO SOLN
5.0000 mg | Freq: Once | ORAL | Status: DC | PRN
  Filled 2016-06-20: qty 5

## 2016-06-20 MED ORDER — FUROSEMIDE 20 MG PO TABS
20.0000 mg | ORAL_TABLET | Freq: Every day | ORAL | Status: DC
  Administered 2016-06-21 – 2016-06-22 (×2): 20 mg via ORAL
  Filled 2016-06-20 (×2): qty 1

## 2016-06-20 MED ORDER — FLUOXETINE HCL 20 MG PO CAPS
20.0000 mg | ORAL_CAPSULE | Freq: Every day | ORAL | Status: DC
  Administered 2016-06-21 – 2016-06-22 (×2): 20 mg via ORAL
  Filled 2016-06-20 (×2): qty 1

## 2016-06-20 MED ORDER — BUPIVACAINE LIPOSOME 1.3 % IJ SUSP
INTRAMUSCULAR | Status: AC
  Filled 2016-06-20: qty 20

## 2016-06-20 MED ORDER — ALBUMIN HUMAN 5 % IV SOLN
INTRAVENOUS | Status: DC | PRN
  Administered 2016-06-20: 12:00:00 via INTRAVENOUS

## 2016-06-20 MED ORDER — SODIUM CHLORIDE 0.9 % IJ SOLN
INTRAMUSCULAR | Status: DC | PRN
  Administered 2016-06-20: 20 mL

## 2016-06-20 MED ORDER — DEXAMETHASONE SODIUM PHOSPHATE 10 MG/ML IJ SOLN
INTRAMUSCULAR | Status: AC
  Filled 2016-06-20: qty 1

## 2016-06-20 MED ORDER — PROPOFOL 10 MG/ML IV BOLUS
INTRAVENOUS | Status: AC
  Filled 2016-06-20: qty 20

## 2016-06-20 MED ORDER — HYDROMORPHONE HCL 2 MG/ML IJ SOLN
0.2500 mg | INTRAMUSCULAR | Status: DC | PRN
  Administered 2016-06-20 (×3): 0.5 mg via INTRAVENOUS

## 2016-06-20 MED ORDER — PREGABALIN 75 MG PO CAPS
75.0000 mg | ORAL_CAPSULE | Freq: Every day | ORAL | Status: DC
  Administered 2016-06-21 – 2016-06-22 (×2): 75 mg via ORAL
  Filled 2016-06-20 (×2): qty 1

## 2016-06-20 MED ORDER — SUCCINYLCHOLINE CHLORIDE 200 MG/10ML IV SOSY
PREFILLED_SYRINGE | INTRAVENOUS | Status: DC | PRN
  Administered 2016-06-20: 120 mg via INTRAVENOUS

## 2016-06-20 SURGICAL SUPPLY — 60 items
BAG LAPAROSCOPIC 12 15 PORT 16 (BASKET) ×1 IMPLANT
BAG RETRIEVAL 12/15 (BASKET) ×2
CHLORAPREP W/TINT 26ML (MISCELLANEOUS) ×2 IMPLANT
CLIP LIGATING HEM O LOK PURPLE (MISCELLANEOUS) ×2 IMPLANT
CLIP LIGATING HEMO LOK XL GOLD (MISCELLANEOUS) IMPLANT
CLIP LIGATING HEMO O LOK GREEN (MISCELLANEOUS) ×4 IMPLANT
COVER SURGICAL LIGHT HANDLE (MISCELLANEOUS) ×2 IMPLANT
COVER TIP SHEARS 8 DVNC (MISCELLANEOUS) ×1 IMPLANT
COVER TIP SHEARS 8MM DA VINCI (MISCELLANEOUS) ×1
DECANTER SPIKE VIAL GLASS SM (MISCELLANEOUS) ×2 IMPLANT
DERMABOND ADVANCED (GAUZE/BANDAGES/DRESSINGS) ×2
DERMABOND ADVANCED .7 DNX12 (GAUZE/BANDAGES/DRESSINGS) ×2 IMPLANT
DRAIN CHANNEL 15F RND FF 3/16 (WOUND CARE) IMPLANT
DRAPE ARM DVNC X/XI (DISPOSABLE) ×4 IMPLANT
DRAPE COLUMN DVNC XI (DISPOSABLE) ×1 IMPLANT
DRAPE DA VINCI XI ARM (DISPOSABLE) ×4
DRAPE DA VINCI XI COLUMN (DISPOSABLE) ×1
DRAPE INCISE IOBAN 66X45 STRL (DRAPES) ×2 IMPLANT
DRAPE SHEET LG 3/4 BI-LAMINATE (DRAPES) ×2 IMPLANT
ELECT PENCIL ROCKER SW 15FT (MISCELLANEOUS) ×2 IMPLANT
ELECT REM PT RETURN 9FT ADLT (ELECTROSURGICAL) ×2
ELECTRODE REM PT RTRN 9FT ADLT (ELECTROSURGICAL) ×1 IMPLANT
EVACUATOR SILICONE 100CC (DRAIN) IMPLANT
GLOVE BIO SURGEON STRL SZ 6.5 (GLOVE) ×2 IMPLANT
GLOVE BIOGEL M STRL SZ7.5 (GLOVE) ×4 IMPLANT
GOWN STRL REUS W/TWL LRG LVL3 (GOWN DISPOSABLE) ×6 IMPLANT
IRRIG SUCT STRYKERFLOW 2 WTIP (MISCELLANEOUS)
IRRIGATION SUCT STRKRFLW 2 WTP (MISCELLANEOUS) IMPLANT
KIT BASIN OR (CUSTOM PROCEDURE TRAY) ×2 IMPLANT
MARKER SKIN DUAL TIP RULER LAB (MISCELLANEOUS) ×2 IMPLANT
NEEDLE INSUFFLATION 14GA 120MM (NEEDLE) ×2 IMPLANT
NS IRRIG 1000ML POUR BTL (IV SOLUTION) IMPLANT
PORT ACCESS TROCAR AIRSEAL 12 (TROCAR) ×1 IMPLANT
PORT ACCESS TROCAR AIRSEAL 5M (TROCAR) ×1
POSITIONER SURGICAL ARM (MISCELLANEOUS) ×4 IMPLANT
RELOAD STAPLER WHITE 60MM (STAPLE) ×4 IMPLANT
RETRACT II ENDO 10MM 32CML (ENDOMECHANICALS) ×2
RETRACTOR II ENDO 10MM 32CML (ENDOMECHANICALS) ×1 IMPLANT
SEAL CANN UNIV 5-8 DVNC XI (MISCELLANEOUS) ×4 IMPLANT
SEAL XI 5MM-8MM UNIVERSAL (MISCELLANEOUS) ×4
SET TRI-LUMEN FLTR TB AIRSEAL (TUBING) ×2 IMPLANT
SOLUTION ELECTROLUBE (MISCELLANEOUS) ×2 IMPLANT
STAPLE ECHEON FLEX 60 POW ENDO (STAPLE) ×2 IMPLANT
STAPLER RELOAD WHITE 60MM (STAPLE) ×8
SUT ETHILON 3 0 PS 1 (SUTURE) ×2 IMPLANT
SUT MNCRL AB 4-0 PS2 18 (SUTURE) ×4 IMPLANT
SUT PDS AB 1 CT1 27 (SUTURE) ×6 IMPLANT
SUT VIC AB 2-0 SH 27 (SUTURE) ×1
SUT VIC AB 2-0 SH 27X BRD (SUTURE) ×1 IMPLANT
SUT VLOC BARB 180 ABS3/0GR12 (SUTURE) ×2
SUTURE VLOC BRB 180 ABS3/0GR12 (SUTURE) ×1 IMPLANT
TAPE STRIPS DRAPE STRL (GAUZE/BANDAGES/DRESSINGS) ×2 IMPLANT
TOWEL OR 17X26 10 PK STRL BLUE (TOWEL DISPOSABLE) ×2 IMPLANT
TOWEL OR NON WOVEN STRL DISP B (DISPOSABLE) ×2 IMPLANT
TRAY FOLEY W/METER SILVER 16FR (SET/KITS/TRAYS/PACK) ×2 IMPLANT
TRAY LAPAROSCOPIC (CUSTOM PROCEDURE TRAY) ×2 IMPLANT
TROCAR BLADELESS OPT 12M 100M (ENDOMECHANICALS) ×2 IMPLANT
TROCAR BLADELESS OPT 5 100 (ENDOMECHANICALS) IMPLANT
TROCAR XCEL 12X100 BLDLESS (ENDOMECHANICALS) ×2 IMPLANT
WATER STERILE IRR 1500ML POUR (IV SOLUTION) ×2 IMPLANT

## 2016-06-20 NOTE — Anesthesia Preprocedure Evaluation (Signed)
Anesthesia Evaluation  Patient identified by MRN, date of birth, ID band Patient awake    Reviewed: Allergy & Precautions, NPO status , Patient's Chart, lab work & pertinent test results  Airway Mallampati: II  TM Distance: >3 FB Neck ROM: Full    Dental no notable dental hx.    Pulmonary neg pulmonary ROS, former smoker,    Pulmonary exam normal breath sounds clear to auscultation       Cardiovascular negative cardio ROS Normal cardiovascular exam Rhythm:Regular Rate:Normal     Neuro/Psych negative neurological ROS  negative psych ROS   GI/Hepatic Neg liver ROS, GERD  Medicated,  Endo/Other  diabetes  Renal/GU Renal InsufficiencyRenal disease  negative genitourinary   Musculoskeletal negative musculoskeletal ROS (+)   Abdominal   Peds negative pediatric ROS (+)  Hematology negative hematology ROS (+)   Anesthesia Other Findings   Reproductive/Obstetrics negative OB ROS                             Anesthesia Physical Anesthesia Plan  ASA: III  Anesthesia Plan: General   Post-op Pain Management:    Induction: Intravenous  Airway Management Planned: Oral ETT  Additional Equipment:   Intra-op Plan:   Post-operative Plan: Extubation in OR  Informed Consent: I have reviewed the patients History and Physical, chart, labs and discussed the procedure including the risks, benefits and alternatives for the proposed anesthesia with the patient or authorized representative who has indicated his/her understanding and acceptance.   Dental advisory given  Plan Discussed with: CRNA and Surgeon  Anesthesia Plan Comments:         Anesthesia Quick Evaluation

## 2016-06-20 NOTE — Transfer of Care (Signed)
Immediate Anesthesia Transfer of Care Note  Patient: Chase Malone  Procedure(s) Performed: Procedure(s): XI ROBOT ASSITED LAPAROSCOPIC NEPHROURETERECTOMY extensive adhesiolysis (Right)  Patient Location: PACU  Anesthesia Type:General  Level of Consciousness: awake, alert , oriented and patient cooperative  Airway & Oxygen Therapy: Patient Spontanous Breathing and Patient connected to face mask oxygen  Post-op Assessment: Report given to RN and Post -op Vital signs reviewed and stable  Post vital signs: Reviewed and stable  Last Vitals:  Vitals:   06/20/16 0720  BP: 126/79  Pulse: 90  Resp: 16  Temp: 36.6 C    Last Pain:  Vitals:   06/20/16 0738  TempSrc:   PainSc: 0-No pain      Patients Stated Pain Goal: 4 (06/20/16 0738)  Complications: No apparent anesthesia complications

## 2016-06-20 NOTE — Brief Op Note (Signed)
06/20/2016  12:49 PM  PATIENT:  Chase Malone  70 y.o. 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  PROCEDURE:  Procedure(s): XI ROBOT ASSITED LAPAROSCOPIC NEPHROURETERECTOMY extensive adhesiolysis (Right)  SURGEON:  Surgeon(s) and Role:    * Sebastian Acheheodore Oak Dorey, MD - Primary    * Valora CorporalJason R Lomboy, MD - Resident - Assisting  PHYSICIAN ASSISTANT:   ASSISTANTS: none   ANESTHESIA:   local and general  EBL:  Total I/O In: 1850 [I.V.:1600; IV Piggyback:250] Out: 250 [Urine:150; Blood:100]  BLOOD ADMINISTERED:none  DRAINS: none   LOCAL MEDICATIONS USED:  MARCAINE     SPECIMEN:  Source of Specimen:  Rt kidney + ureter, ureteral stone  DISPOSITION OF SPECIMEN:  PATHOLOGY  COUNTS:  YES  TOURNIQUET:  * No tourniquets in log *  DICTATION: .Other Dictation: Dictation Number [id number]  PLAN OF CARE: Admit to inpatient   PATIENT DISPOSITION:  PACU - hemodynamically stable.   Delay start of Pharmacological VTE agent (>24hrs) due to surgical blood loss or risk of bleeding: yes

## 2016-06-20 NOTE — Anesthesia Procedure Notes (Signed)
Procedure Name: Intubation Date/Time: 06/20/2016 8:57 AM Performed by: Delphia GratesHANDLER, Lautaro Koral Pre-anesthesia Checklist: Emergency Drugs available, Suction available, Patient being monitored and Patient identified Patient Re-evaluated:Patient Re-evaluated prior to inductionOxygen Delivery Method: Circle system utilized Preoxygenation: Pre-oxygenation with 100% oxygen Intubation Type: IV induction and Cricoid Pressure applied Laryngoscope Size: Mac Grade View: Grade II Tube type: Oral Tube size: 7.5 mm Number of attempts: 1 Airway Equipment and Method: Stylet Placement Confirmation: ETT inserted through vocal cords under direct vision,  positive ETCO2 and breath sounds checked- equal and bilateral Secured at: 21 cm Tube secured with: Tape Dental Injury: Teeth and Oropharynx as per pre-operative assessment

## 2016-06-20 NOTE — Anesthesia Postprocedure Evaluation (Signed)
Anesthesia Post Note  Patient: Chase Malone  Procedure(s) Performed: Procedure(s) (LRB): XI ROBOT ASSITED LAPAROSCOPIC NEPHROURETERECTOMY extensive adhesiolysis (Right)  Patient location during evaluation: PACU Anesthesia Type: General Level of consciousness: awake and alert Pain management: pain level controlled Vital Signs Assessment: post-procedure vital signs reviewed and stable Respiratory status: spontaneous breathing, nonlabored ventilation, respiratory function stable and patient connected to nasal cannula oxygen Cardiovascular status: blood pressure returned to baseline and stable Postop Assessment: no signs of nausea or vomiting Anesthetic complications: no    Last Vitals:  Vitals:   06/20/16 1315 06/20/16 1330  BP: 118/69 116/66  Pulse: 78 77  Resp: 11 10  Temp:      Last Pain:  Vitals:   06/20/16 1330  TempSrc:   PainSc: 4                  Jahaan Vanwagner S

## 2016-06-20 NOTE — H&P (Signed)
Chase Malone is an 70 y.o. male.    Chief Complaint: Pre-op RIGHT robotic nephro-ureterectomy  HPI:   1 - Right Mid Ureteral Stone in Atrophic Kidney - 10mm Rt ureteral stone at level of iliac crossing by hematuria CT 03/2016. Some question of enhancing tissue distal which on ureteroscopy and brush biopsy was most c/w benign scar and JJ stent placed. 2 artery (both quite small) 1 vein right renovascular anatomy. Otherwise unremarkable contralateral kidney and hematuria eval. Cr 1.3's 2017. Dedicated nuc med renogram 05/2016 with only 24% right sided function even with stent in place and significantly delayed excretion (likely overestimating function).   PMH sig for lap chole, SWL x several (he thinks each side), DM2, NO ischemic CV disease / blood thinners. His PCP is Fara ChutePaul Sasser MD.   Today "Chase Malone" is seen to proceed with RIGHT robotic nephro-ureterectomy. No interval fevers.       Past Medical History:  Diagnosis Date  . Anxiety   . Arthritis   . Chronic kidney disease   . Depression   . Diabetes mellitus without complication (HCC)    type II   . GERD (gastroesophageal reflux disease)   . Headache    migraines   . History of kidney stones   . Pneumonia    hx of 2013     Past Surgical History:  Procedure Laterality Date  . bilateral cataract surgery     . bilateral trigger fingers      2 fingers on each hand   . CHOLECYSTECTOMY    . left hand carpal tunnel     . left knee surgery     . right knee surgery      x 3  . VASECTOMY      No family history on file. Social History:  reports that he quit smoking about 42 years ago. His smoking use included Cigarettes. He has never used smokeless tobacco. He reports that he does not drink alcohol or use drugs.  Allergies: No Known Allergies  No prescriptions prior to admission.    No results found for this or any previous visit (from the past 48 hour(s)). No results found.  Review of Systems  Constitutional: Negative.    HENT: Negative.   Eyes: Negative.   Respiratory: Negative.   Cardiovascular: Negative.   Gastrointestinal: Negative.   Genitourinary: Negative.   Musculoskeletal: Negative.   Skin: Negative.   Neurological: Negative.   Endo/Heme/Allergies: Negative.   Psychiatric/Behavioral: Negative.     There were no vitals taken for this visit. Physical Exam  Constitutional: He appears well-developed.  HENT:  Head: Normocephalic.  Eyes: Pupils are equal, round, and reactive to light.  Neck: Normal range of motion.  Cardiovascular: Normal rate.   Respiratory: Effort normal.  GI: Soft.  Genitourinary:  Genitourinary Comments: No CVAT at present.   Musculoskeletal: Normal range of motion.  Neurological: He is alert.  Skin: Skin is warm.  Psychiatric: He has a normal mood and affect. His behavior is normal. Judgment and thought content normal.     Assessment/Plan  Rec proceed with RIGHT neph-U, will plan on non-bladder cuff dissection as benign indication. That will likely avoid need for prolonged foley post-op. Risks, benefits, alternatives, expected peri-op course discussed previously and reiterated today.      Sebastian AcheMANNY, Fredonia Casalino, MD 06/20/2016, 6:20 AM

## 2016-06-21 LAB — BASIC METABOLIC PANEL
Anion gap: 9 (ref 5–15)
BUN: 14 mg/dL (ref 6–20)
CALCIUM: 8 mg/dL — AB (ref 8.9–10.3)
CO2: 22 mmol/L (ref 22–32)
CREATININE: 1.78 mg/dL — AB (ref 0.61–1.24)
Chloride: 105 mmol/L (ref 101–111)
GFR calc Af Amer: 42 mL/min — ABNORMAL LOW (ref >60)
GFR, EST NON AFRICAN AMERICAN: 36 mL/min — AB (ref >60)
Glucose, Bld: 193 mg/dL — ABNORMAL HIGH (ref 65–99)
Potassium: 4.5 mmol/L (ref 3.5–5.1)
Sodium: 136 mmol/L (ref 135–145)

## 2016-06-21 LAB — GLUCOSE, CAPILLARY
GLUCOSE-CAPILLARY: 132 mg/dL — AB (ref 65–99)
GLUCOSE-CAPILLARY: 186 mg/dL — AB (ref 65–99)
GLUCOSE-CAPILLARY: 176 mg/dL — AB (ref 65–99)
Glucose-Capillary: 217 mg/dL — ABNORMAL HIGH (ref 65–99)

## 2016-06-21 LAB — HEMOGLOBIN AND HEMATOCRIT, BLOOD
HCT: 35.7 % — ABNORMAL LOW (ref 39.0–52.0)
Hemoglobin: 12.1 g/dL — ABNORMAL LOW (ref 13.0–17.0)

## 2016-06-21 NOTE — Progress Notes (Addendum)
Urology Progress Note  1 Day Post-Op  Subjective: The patient is doing well.  No nausea or vomiting. Pain is adequately controlled.  Objective: Vital signs in last 24 hours: Temp:  [97.4 F (36.3 C)-98.2 F (36.8 C)] 98.2 F (36.8 C) (12/16 0500) Pulse Rate:  [70-86] 70 (12/16 0500) Resp:  [10-19] 16 (12/16 0500) BP: (103-125)/(57-78) 103/57 (12/16 0500) SpO2:  [95 %-100 %] 96 % (12/16 0500) Weight:  [84.8 kg (187 lb)] 84.8 kg (187 lb) (12/15 1435)  Intake/Output from previous day: 12/15 0701 - 12/16 0700 In: 2972.5 [I.V.:2722.5; IV Piggyback:250] Out: 2625 [Urine:2450; Drains:75; Blood:100]   Intake/Output this shift: Total I/O In: 700 [Other:700] Out: -   Physical Exam:  General: Alert and oriented. CV: RRR Lungs: Clear bilaterally. GI: Soft, Nondistended. Incisions: Clean, dry, and intact Urine: Clear Extremities: Nontender, no erythema, no edema.  Lab Results:  Recent Labs  06/20/16 1328 06/21/16 0458  HGB 12.5* 12.1*  HCT 37.3* 35.7*     Assessment/Plan: POD#1 s/p right robotic nephroureterectomy by Dr. Berneice HeinrichManny. Doing well.   1. D/c foley 2. Advance diet as tolerated, med lock fluids 3. Ambulation, IS 4. Likely discharge tomorrow, JP drain to be removed prior to discharge    LOS: 1 day   LOMBOY, JASON R 06/21/2016, 11:25 AM   I interviewed and examined the pt face to face.  I agree with assessment and plan.

## 2016-06-21 NOTE — Op Note (Signed)
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  PROCEDURE: 1. Robotic laparoscopic right nephroureterectomy. 2. Laparoscopic extensive adhesiolysis.  ESTIMATED BLOOD LOSS:  100 mL.  COMPLICATION:  None.  SPECIMEN: 1. Right kidney and ureter (without bladder cuff). 2. Right ureteral stone for ID only.  ASSISTANT:  Buck MamJason Lomboy MD  DRAINS: 1. Jackson-Pratt drain to wall suction. 2. Foley catheter straight drain.  FINDINGS: 1. Significant intraabdominal fat, minimal subcutaneous fat. 2. Extensive omental adhesion in the inferior liver edge and omentum. 3. Significant adhesions in the right lower quadrant. 4. Two arteries, 1 vein, right renovascular anatomy as anticipated. 5. Significant inflammatory desmoplastic response around the entire     right kidney and ureter as anticipated. 6. Transection of ureter at the level just distal to the iliacs as     planned.  INDICATION: 1. Distal ureteral stone positively identified. 2. Complete removal of in situ right ureteral stent.  INDICATION:  Mr. Chase Malone is a pleasant 70 year old gentleman, who was found incidentally to have a very atrophic right kidney with a right distal ureteral stone.  He also has history of recurrent urinary tract infections.  Renography confirmed minimal function right kidney even with stenting.  Options were discussed for definitive management including  Ureteroscopy for ureteral stone alone versus more definitive therapy with right nephroureterectomy with goal of removing the in-situ right atrophic kidney to remove the nidus of infection and he wished to proceed with the  latter.  Informed consent was obtained and placed in the medical record.  PROCEDURE IN DETAIL:  The patient being Chase Malone was verified.  Procedure being right nephroureterectomy was confirmed.  Procedure carried out. Time-out was performed.  Intravenous antibiotics were administered. General endotracheal anesthesia was introduced.  Foley catheter was placed per urethra to straight drain.  He was placed into a right side up full flank position, applying 15 degrees of stable flexion, superior arm elevator, axillary roll, sequential compression devices, bottom leg bent, top leg straight.  He was further fashioned to the operating table using 3-inch tape over foam padding across the supraxiphoid, chest, and his pelvis.  Next, the sterile field was created by first clipper shaving and prepping and draping the patient's right flank and abdomen using chlorhexidine gluconate and high-flow, low-pressure pneumoperitoneum was obtained using Veress technique in the right lower quadrant having passed the aspiration and drop test.  Next, an 8-mm robotic camera port was placed in position approximately 2 handbreadth superior lateral to the umbilicus.  Laparoscopic examination of the peritoneal cavity revealed some dense omental adhesions in the right upper quadrant at the area of the inferior border to the liver likely consistent with prior cholecystitis.  Additional ports were placed as follows:  Right subcostal 8-mm robotic port, right subxiphoid 5 mm liver retraction port, right  far lateral 8-mm robotic port 4 fingerbreadths superior medial to the anterior iliac spine, right paramedian inferior robotic port approximately 1 handbreadth superior to the pubic ramus just lateral to the midline, and two 12-mm assistant ports in the midline, 1 in the infraumbilical crease and another approximately 2 fingerbreadths above the camera port.  Robot was docked and passed through the electronic checks.  Initial  attention was directed at adhesiolysis and very careful adhesiolysis was performed of dense omental adhesions from the omentum to the inferior border of the liver thus allowing the liver to retract more superiorly under direct vision. The self locking grasper was used to place the liver on superior traction. Next, the retroperitoneum was initially developed incising lateral to the ascending colon from the area of the cecum towards the area of the hepatic flexure, and carefully sweeping medially lower pole of the kidney.  The area was identified and placed on gentle lateral traction.  Dissection proceeded medial to this.  As expected, there was significant desmoplastic reaction and inflammation in the entire right retroperitoneum consistent with likely chronically obstructed kidney and recurrent infections.  The ureter and right gonadal were identified. Ureters placed on gentle lateral traction.  Dissection proceeded within the triangle of the psoas musculature towards the area of the renal hilum.  Renal hilum consisted of 2 small arteries, 1 vein, right renovascular anatomy as anticipated.  Arteries were circumferentially mobilized and controlled using vascular clip proximally and vascular load stapler distally and taken separately and the vein also taken using a separate fire of the vascular load stapler.  This resulted in excellent hemostatic control of the hilum.  Superior attachments were taken down using combination of cautery scissors and vascular staplers. Lateral attachments were taken down using cautery scissors.  Attention was directed at more formal ureteral dissection.  The ureter was very dilated and quite inflamed as anticipated.  Very careful circumferential mobilization was performed towards the area of the iliac crossing which was identified.  Verifying preoperative imaging, it was felt that this was a sufficient length to allow removal of the ureter with the stone  in situ.  As such, the ureter was very carefully transected across half of its length and the distal portion of the in-situ stent was removed via this site and an extra-large clip placed across proximally to prevent migration intraoperatively.  During these maneuvers, the in-situ stone was positively identified and removed and set aside for gross pathologic exam.  The distal ureteral stone being of quite wide dilated caliber was controlled using over-sewing of 3-0 V-Loc and a separate imbricating layer which resulted in excellent hemostatic control of the distal ureteral stone.  The right kidney and ureter specimen was completely freed.  This was placed in extra large EndoCatch bag.  A closed suction drain was brought to the previous right lateral most robotic port site near the peritoneal cavity.  Robot was then undocked.  The inferior most assistant port site was closed at the level of the fascia using Montez Moritaarter- Thomason suture passer and 0 Vicryl x2.  Specimen retrieved by extending the previous superior most assistant port site for total distance approximately 4 cm removing and setting aside for permanent pathology. The extraction site was closed at the level of the fascia using figure- of-eight PDS x5 and the Scarpa was reapproximated using running Vicryl. Given the paucity of cutaneous fat, all port sites were also closed deep dermally using interrupted Vicryl.  40 mL of lyophilized Marcaine was infiltrated throughout the incision  sites which were then all closed at the level of the skin using subcuticular Monocryl followed by Dermabond. Drain stitch was applied, procedure was terminated.  The patient tolerated the procedure well.  There were no immediate periprocedural complications.  The patient was taken to the postanesthesia care in stable condition.  Please note, surgical assistant completely crucial for the procedure today.  He was invaluable for all laparoscopic and robotic  portions providing suctioning, retraction, firing of vascular stapler and clips, this being completely vital to the vascular surgery today.    ______________________________ Sebastian Ache, MD   ______________________________ Sebastian Ache, MD    TM/MEDQ  D:  06/20/2016  T:  06/21/2016  Job:  [id number]

## 2016-06-21 NOTE — Op Note (Deleted)
  The note originally documented on this encounter has been moved the the encounter in which it belongs.  

## 2016-06-22 LAB — GLUCOSE, CAPILLARY: Glucose-Capillary: 221 mg/dL — ABNORMAL HIGH (ref 65–99)

## 2016-06-22 MED ORDER — SENNA 8.6 MG PO TABS: 1.0000 | ORAL_TABLET | Freq: Two times a day (BID) | ORAL | 1 refills | Status: DC

## 2016-06-22 MED ORDER — OXYCODONE-ACETAMINOPHEN 5-325 MG PO TABS: 1.0000 | ORAL_TABLET | ORAL | 0 refills | Status: DC | PRN

## 2016-06-22 MED ORDER — DOCUSATE SODIUM 100 MG PO CAPS: 100.0000 mg | ORAL_CAPSULE | Freq: Two times a day (BID) | ORAL | 1 refills | Status: DC

## 2016-06-22 NOTE — Discharge Summary (Signed)
Date of admission: 06/20/2016  Date of discharge: 06/22/2016  Admission diagnosis: Atrophic right kidney and right ureteral stone  Discharge diagnosis: Same  History and Physical: For full details, please see admission history and physical. Briefly, Chase Malone is a 70 y.o. male with a right ureteral stone in an atrophic kidney. After discussing management/treatment options, he  elected to proceed with surgical treatment.  Hospital Course: [name] was taken to the operating room on 06/20/2016 and underwent a robotic right nephroureterectomy. He tolerated this procedure well and without complications. Postoperatively, the patient was able to be transferred to a regular hospital room following recovery from anesthesia.  They were able to begin ambulating the night of surgery and remained hemodynamically stable overnight.  On POD#1, his foley was removed. He was given a regular diet and medlocked. They was transitioned to oral pain medication, tolerated a regular diet, and had met all discharge criteria and was able to be discharged home on POD#2. He was passing gas and ambulating at the time of discharge.  Laboratory values:   Recent Labs  06/20/16 1328 06/21/16 0458  HGB 12.5* 12.1*  HCT 37.3* 35.7*    Disposition: Home  Discharge instruction: They were instructed to be ambulatory but to refrain from heavy lifting, strenuous activity, or driving.  Discharge medications:   Allergies as of 06/22/2016   No Known Allergies     Medication List    STOP taking these medications   aspirin EC 81 MG tablet     TAKE these medications   amitriptyline 10 MG tablet Commonly known as:  ELAVIL Take 10 mg by mouth at bedtime.   cetirizine 10 MG tablet Commonly known as:  ZYRTEC Take 10 mg by mouth daily.   clonazePAM 1 MG tablet Commonly known as:  KLONOPIN Take 1 mg by mouth at bedtime.   docusate sodium 100 MG capsule Commonly known as:  COLACE Take 1 capsule (100 mg total) by  mouth 2 (two) times daily.   esomeprazole 40 MG capsule Commonly known as:  NEXIUM Take 40 mg by mouth daily at 12 noon.   ezetimibe-simvastatin 10-20 MG tablet Commonly known as:  VYTORIN Take 1 tablet by mouth every evening.   finasteride 5 MG tablet Commonly known as:  PROSCAR Take 5 mg by mouth daily.   Fish Oil 1000 MG Caps Take 1,000 mg by mouth daily.   FLUoxetine 20 MG capsule Commonly known as:  PROZAC Take 20 mg by mouth daily.   furosemide 20 MG tablet Commonly known as:  LASIX Take 20 mg by mouth daily.   glipiZIDE 10 MG 24 hr tablet Commonly known as:  GLUCOTROL XL Take 10 mg by mouth daily with breakfast.   metFORMIN 500 MG 24 hr tablet Commonly known as:  GLUCOPHAGE-XR Take 500 mg by mouth 2 (two) times daily. With breakfast and with supper   oxyCODONE-acetaminophen 5-325 MG tablet Commonly known as:  PERCOCET/ROXICET Take 1 tablet by mouth every 4 (four) hours as needed for moderate pain.   polyethylene glycol packet Commonly known as:  MIRALAX / GLYCOLAX Take 17 g by mouth daily as needed (for constipation).   pregabalin 75 MG capsule Commonly known as:  LYRICA Take 75-150 mg by mouth 2 (two) times daily. 75 mg in the morning and 150 mg in the evening   senna 8.6 MG Tabs tablet Commonly known as:  SENOKOT Take 1 tablet (8.6 mg total) by mouth 2 (two) times daily.   SUMAtriptan 100 MG tablet Commonly  known as:  IMITREX Take 100 mg by mouth daily as needed for migraine. May repeat in 2 hours if headache persists or recurs.   tamsulosin 0.4 MG Caps capsule Commonly known as:  FLOMAX Take 0.4 mg by mouth 2 (two) times daily.   timolol 0.5 % ophthalmic solution Commonly known as:  BETIMOL Place 1 drop into both eyes daily.       Followup: He will follow-up as scheduled with Dr. Tresa Moore.  I interviewed and examined the pt today. I agree with the above note

## 2016-06-22 NOTE — Discharge Instructions (Addendum)
1. Activity:  You are encouraged to ambulate frequently (about every hour during waking hours) to help prevent blood clots from forming in your legs or lungs.  However, you should not engage in any heavy lifting (> 10-15 lbs), strenuous activity, or straining. 2. Diet: You should continue a clear liquid diet until passing gas from below.  Once this occurs, you may advance your diet to a soft diet that would be easy to digest (i.e soups, scrambled eggs, mashed potatoes, etc.) for 24 hours just as you would if getting over a bad stomach flu.  If tolerating this diet well for 24 hours, you may then begin eating regular food.  It will be normal to have some amount of bloating, nausea, and abdominal discomfort intermittently. 3. Prescriptions:  You will be provided a prescription for pain medication to take as needed.  If your pain is not severe enough to require the prescription pain medication, you may take Tylenol instead.  You should also take an over the counter stool softener (Colace twice daily and Senna twice daily) to avoid straining with bowel movements as the pain medication may constipate you.  4. Incisions:  You may start showering (not soaking or bathing in water) 48 hours after surgery and the incisions simply need to be patted dry after the shower.  No additional care is needed. 5. What to call us about: You should call the office [phone]) if you develop fever > 101, persistent vomiting. Also, feel free to call with any other questions you may have and remember the handout that was provided to you as a reference preoperatively which answers many of the common questions that arise after surgery. 6. You may resume aspirin, advil, aleve, vitamins, and supplements 7 days after surgery.

## 2016-06-23 ENCOUNTER — Encounter (HOSPITAL_COMMUNITY): Payer: Self-pay | Admitting: Urology

## 2016-07-04 DIAGNOSIS — J209 Acute bronchitis, unspecified: Secondary | ICD-10-CM | POA: Diagnosis not present

## 2016-07-04 DIAGNOSIS — J019 Acute sinusitis, unspecified: Secondary | ICD-10-CM | POA: Diagnosis not present

## 2016-07-04 DIAGNOSIS — Z6828 Body mass index (BMI) 28.0-28.9, adult: Secondary | ICD-10-CM | POA: Diagnosis not present

## 2016-07-29 DIAGNOSIS — R350 Frequency of micturition: Secondary | ICD-10-CM | POA: Diagnosis not present

## 2016-07-29 DIAGNOSIS — N183 Chronic kidney disease, stage 3 (moderate): Secondary | ICD-10-CM | POA: Diagnosis not present

## 2016-08-14 DIAGNOSIS — E1140 Type 2 diabetes mellitus with diabetic neuropathy, unspecified: Secondary | ICD-10-CM | POA: Diagnosis not present

## 2016-08-14 DIAGNOSIS — E781 Pure hyperglyceridemia: Secondary | ICD-10-CM | POA: Diagnosis not present

## 2016-08-14 DIAGNOSIS — N183 Chronic kidney disease, stage 3 (moderate): Secondary | ICD-10-CM | POA: Diagnosis not present

## 2016-08-14 DIAGNOSIS — E1165 Type 2 diabetes mellitus with hyperglycemia: Secondary | ICD-10-CM | POA: Diagnosis not present

## 2016-08-14 DIAGNOSIS — E7800 Pure hypercholesterolemia, unspecified: Secondary | ICD-10-CM | POA: Diagnosis not present

## 2016-08-14 DIAGNOSIS — E782 Mixed hyperlipidemia: Secondary | ICD-10-CM | POA: Diagnosis not present

## 2016-08-14 DIAGNOSIS — K210 Gastro-esophageal reflux disease with esophagitis: Secondary | ICD-10-CM | POA: Diagnosis not present

## 2016-08-14 DIAGNOSIS — E1122 Type 2 diabetes mellitus with diabetic chronic kidney disease: Secondary | ICD-10-CM | POA: Diagnosis not present

## 2016-08-19 DIAGNOSIS — G43009 Migraine without aura, not intractable, without status migrainosus: Secondary | ICD-10-CM | POA: Diagnosis not present

## 2016-08-19 DIAGNOSIS — K210 Gastro-esophageal reflux disease with esophagitis: Secondary | ICD-10-CM | POA: Diagnosis not present

## 2016-08-19 DIAGNOSIS — E1140 Type 2 diabetes mellitus with diabetic neuropathy, unspecified: Secondary | ICD-10-CM | POA: Diagnosis not present

## 2016-08-19 DIAGNOSIS — F330 Major depressive disorder, recurrent, mild: Secondary | ICD-10-CM | POA: Diagnosis not present

## 2016-08-19 DIAGNOSIS — E1122 Type 2 diabetes mellitus with diabetic chronic kidney disease: Secondary | ICD-10-CM | POA: Diagnosis not present

## 2016-08-19 DIAGNOSIS — F411 Generalized anxiety disorder: Secondary | ICD-10-CM | POA: Diagnosis not present

## 2016-08-19 DIAGNOSIS — N183 Chronic kidney disease, stage 3 (moderate): Secondary | ICD-10-CM | POA: Diagnosis not present

## 2016-08-19 DIAGNOSIS — E782 Mixed hyperlipidemia: Secondary | ICD-10-CM | POA: Diagnosis not present

## 2017-01-08 DIAGNOSIS — E1165 Type 2 diabetes mellitus with hyperglycemia: Secondary | ICD-10-CM | POA: Diagnosis not present

## 2017-01-08 DIAGNOSIS — E782 Mixed hyperlipidemia: Secondary | ICD-10-CM | POA: Diagnosis not present

## 2017-01-12 DIAGNOSIS — K210 Gastro-esophageal reflux disease with esophagitis: Secondary | ICD-10-CM | POA: Diagnosis not present

## 2017-01-12 DIAGNOSIS — N183 Chronic kidney disease, stage 3 (moderate): Secondary | ICD-10-CM | POA: Diagnosis not present

## 2017-01-12 DIAGNOSIS — Z6829 Body mass index (BMI) 29.0-29.9, adult: Secondary | ICD-10-CM | POA: Diagnosis not present

## 2017-01-12 DIAGNOSIS — E782 Mixed hyperlipidemia: Secondary | ICD-10-CM | POA: Diagnosis not present

## 2017-01-12 DIAGNOSIS — Z1389 Encounter for screening for other disorder: Secondary | ICD-10-CM | POA: Diagnosis not present

## 2017-01-12 DIAGNOSIS — E1140 Type 2 diabetes mellitus with diabetic neuropathy, unspecified: Secondary | ICD-10-CM | POA: Diagnosis not present

## 2017-01-12 DIAGNOSIS — Z0001 Encounter for general adult medical examination with abnormal findings: Secondary | ICD-10-CM | POA: Diagnosis not present

## 2017-01-12 DIAGNOSIS — E1122 Type 2 diabetes mellitus with diabetic chronic kidney disease: Secondary | ICD-10-CM | POA: Diagnosis not present

## 2017-01-23 ENCOUNTER — Other Ambulatory Visit: Payer: Self-pay

## 2017-04-09 DIAGNOSIS — Z23 Encounter for immunization: Secondary | ICD-10-CM | POA: Diagnosis not present

## 2017-05-11 DIAGNOSIS — E7800 Pure hypercholesterolemia, unspecified: Secondary | ICD-10-CM | POA: Diagnosis not present

## 2017-05-11 DIAGNOSIS — E782 Mixed hyperlipidemia: Secondary | ICD-10-CM | POA: Diagnosis not present

## 2017-05-11 DIAGNOSIS — E1122 Type 2 diabetes mellitus with diabetic chronic kidney disease: Secondary | ICD-10-CM | POA: Diagnosis not present

## 2017-05-11 DIAGNOSIS — N183 Chronic kidney disease, stage 3 (moderate): Secondary | ICD-10-CM | POA: Diagnosis not present

## 2017-05-11 DIAGNOSIS — E781 Pure hyperglyceridemia: Secondary | ICD-10-CM | POA: Diagnosis not present

## 2017-05-11 DIAGNOSIS — F411 Generalized anxiety disorder: Secondary | ICD-10-CM | POA: Diagnosis not present

## 2017-05-11 DIAGNOSIS — E1165 Type 2 diabetes mellitus with hyperglycemia: Secondary | ICD-10-CM | POA: Diagnosis not present

## 2017-05-11 DIAGNOSIS — K210 Gastro-esophageal reflux disease with esophagitis: Secondary | ICD-10-CM | POA: Diagnosis not present

## 2017-05-11 DIAGNOSIS — E1140 Type 2 diabetes mellitus with diabetic neuropathy, unspecified: Secondary | ICD-10-CM | POA: Diagnosis not present

## 2017-05-13 DIAGNOSIS — E782 Mixed hyperlipidemia: Secondary | ICD-10-CM | POA: Diagnosis not present

## 2017-05-13 DIAGNOSIS — Z6830 Body mass index (BMI) 30.0-30.9, adult: Secondary | ICD-10-CM | POA: Diagnosis not present

## 2017-05-13 DIAGNOSIS — F411 Generalized anxiety disorder: Secondary | ICD-10-CM | POA: Diagnosis not present

## 2017-05-13 DIAGNOSIS — R1031 Right lower quadrant pain: Secondary | ICD-10-CM | POA: Diagnosis not present

## 2017-05-13 DIAGNOSIS — K210 Gastro-esophageal reflux disease with esophagitis: Secondary | ICD-10-CM | POA: Diagnosis not present

## 2017-05-13 DIAGNOSIS — E1122 Type 2 diabetes mellitus with diabetic chronic kidney disease: Secondary | ICD-10-CM | POA: Diagnosis not present

## 2017-05-13 DIAGNOSIS — F330 Major depressive disorder, recurrent, mild: Secondary | ICD-10-CM | POA: Diagnosis not present

## 2017-05-13 DIAGNOSIS — N183 Chronic kidney disease, stage 3 (moderate): Secondary | ICD-10-CM | POA: Diagnosis not present

## 2017-05-13 DIAGNOSIS — E1140 Type 2 diabetes mellitus with diabetic neuropathy, unspecified: Secondary | ICD-10-CM | POA: Diagnosis not present

## 2017-05-13 DIAGNOSIS — G43009 Migraine without aura, not intractable, without status migrainosus: Secondary | ICD-10-CM | POA: Diagnosis not present

## 2017-07-31 IMAGING — NM NM RENAL IMAGING FLOW W/ PHARM
2 series · 12 of 12 positions shown · non-contrast
Comparison: None

Correlation:  CT abdomen/pelvis 03/20/2016

CLINICAL DATA: Hematuria beginning March 2016, ureteral
calculus, history of RIGHT renal stenting 04/03/2016, atrophic
kidney

EXAM:
NUCLEAR MEDICINE RENAL SCAN WITH DIURETIC ADMINISTRATION
TECHNIQUE: Radionuclide angiographic and sequential renal images were obtained
after intravenous injection of radiopharmaceutical. Imaging was
continued during slow intravenous injection of Lasix approximately
15 minutes after the start of the examination.
RADIOPHARMACEUTICALS:  5.50 mCi Xechnetium-OOm MAG3 IV
Pharmaceutical:
33 mg Lasix IV

[Series 1: renal scan · 4.14mm/px · 6 of 40 frames shown (1 of 2)]
[frame 4/40  full-range]
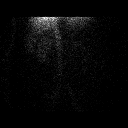
[frame 10/40  full-range]
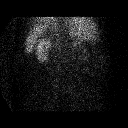
[frame 17/40  full-range]
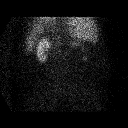
[frame 24/40  full-range]
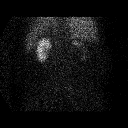
[frame 30/40  full-range]
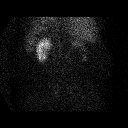
[frame 37/40  full-range]
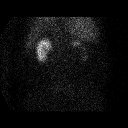

[Series 1: renal scan · 4.14mm/px · 6 of 73 frames shown (2 of 2)]
[frame 7/73]
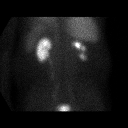
[frame 19/73]
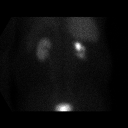
[frame 31/73]
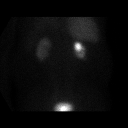
[frame 43/73]
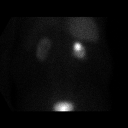
[frame 55/73]
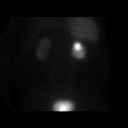
[frame 67/73]
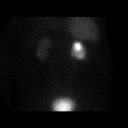

[12 of 12 positions shown; findings below may reference images not displayed]

FINDINGS: Flow: Diminished and delayed blood flow to RIGHT kidney versus LEFT.
Normal blood flow to LEFT kidney..

Left renogram: Normal uptake, concentration, and excretion of tracer
by LEFT kidney. No hydronephrosis. No tracer retention. Normal time
to peak activity of 3.2 minutes with fall to half maximum
minutes later.

Right renogram: Significantly delayed and diminished uptake of
tracer by RIGHT kidney. Delayed concentration and excretion of
tracer into a dilated collecting system. Prolonged retention of
tracer within the RIGHT renal collecting system throughout the study
despite diuretic administration. Continually increasing renogram
curve on RIGHT throughout exam. No fall in curve identified.

Differential:

Left kidney = 76 %

Right kidney = 24 %

T1/2 post Lasix :

Left kidney = 5.7 min

Right kidney = N/A min
IMPRESSION: Normal LEFT renogram.

Markedly impaired RIGHT renal function with poor uptake,
concentration, and excretion of tracer.

Dilated RIGHT renal collecting system with failure of clearance of
tracer from the RIGHT kidney following diuretic administration.

Findings are compatible with urodynamically significant urinary
outflow obstruction of the RIGHT kidney.

## 2017-09-08 DIAGNOSIS — E7800 Pure hypercholesterolemia, unspecified: Secondary | ICD-10-CM | POA: Diagnosis not present

## 2017-09-08 DIAGNOSIS — E1140 Type 2 diabetes mellitus with diabetic neuropathy, unspecified: Secondary | ICD-10-CM | POA: Diagnosis not present

## 2017-09-08 DIAGNOSIS — E781 Pure hyperglyceridemia: Secondary | ICD-10-CM | POA: Diagnosis not present

## 2017-09-08 DIAGNOSIS — E1165 Type 2 diabetes mellitus with hyperglycemia: Secondary | ICD-10-CM | POA: Diagnosis not present

## 2017-09-08 DIAGNOSIS — N183 Chronic kidney disease, stage 3 (moderate): Secondary | ICD-10-CM | POA: Diagnosis not present

## 2017-09-08 DIAGNOSIS — E1122 Type 2 diabetes mellitus with diabetic chronic kidney disease: Secondary | ICD-10-CM | POA: Diagnosis not present

## 2017-09-08 DIAGNOSIS — E782 Mixed hyperlipidemia: Secondary | ICD-10-CM | POA: Diagnosis not present

## 2017-09-08 DIAGNOSIS — K210 Gastro-esophageal reflux disease with esophagitis: Secondary | ICD-10-CM | POA: Diagnosis not present

## 2017-09-11 DIAGNOSIS — Z6830 Body mass index (BMI) 30.0-30.9, adult: Secondary | ICD-10-CM | POA: Diagnosis not present

## 2017-09-11 DIAGNOSIS — E1122 Type 2 diabetes mellitus with diabetic chronic kidney disease: Secondary | ICD-10-CM | POA: Diagnosis not present

## 2017-09-11 DIAGNOSIS — G43009 Migraine without aura, not intractable, without status migrainosus: Secondary | ICD-10-CM | POA: Diagnosis not present

## 2017-09-11 DIAGNOSIS — E782 Mixed hyperlipidemia: Secondary | ICD-10-CM | POA: Diagnosis not present

## 2017-09-11 DIAGNOSIS — R1031 Right lower quadrant pain: Secondary | ICD-10-CM | POA: Diagnosis not present

## 2017-09-11 DIAGNOSIS — E1140 Type 2 diabetes mellitus with diabetic neuropathy, unspecified: Secondary | ICD-10-CM | POA: Diagnosis not present

## 2017-09-11 DIAGNOSIS — N183 Chronic kidney disease, stage 3 (moderate): Secondary | ICD-10-CM | POA: Diagnosis not present

## 2017-09-11 DIAGNOSIS — K210 Gastro-esophageal reflux disease with esophagitis: Secondary | ICD-10-CM | POA: Diagnosis not present

## 2017-10-13 DIAGNOSIS — E11319 Type 2 diabetes mellitus with unspecified diabetic retinopathy without macular edema: Secondary | ICD-10-CM | POA: Diagnosis not present

## 2017-11-13 DIAGNOSIS — H401121 Primary open-angle glaucoma, left eye, mild stage: Secondary | ICD-10-CM | POA: Diagnosis not present

## 2017-11-13 DIAGNOSIS — H26493 Other secondary cataract, bilateral: Secondary | ICD-10-CM | POA: Diagnosis not present

## 2017-11-13 DIAGNOSIS — H401111 Primary open-angle glaucoma, right eye, mild stage: Secondary | ICD-10-CM | POA: Diagnosis not present

## 2017-11-13 DIAGNOSIS — H16223 Keratoconjunctivitis sicca, not specified as Sjogren's, bilateral: Secondary | ICD-10-CM | POA: Diagnosis not present

## 2017-11-13 DIAGNOSIS — Z961 Presence of intraocular lens: Secondary | ICD-10-CM | POA: Diagnosis not present

## 2017-11-13 DIAGNOSIS — H04123 Dry eye syndrome of bilateral lacrimal glands: Secondary | ICD-10-CM | POA: Diagnosis not present

## 2018-01-14 DIAGNOSIS — E1140 Type 2 diabetes mellitus with diabetic neuropathy, unspecified: Secondary | ICD-10-CM | POA: Diagnosis not present

## 2018-01-14 DIAGNOSIS — Z6828 Body mass index (BMI) 28.0-28.9, adult: Secondary | ICD-10-CM | POA: Diagnosis not present

## 2018-01-14 DIAGNOSIS — N183 Chronic kidney disease, stage 3 (moderate): Secondary | ICD-10-CM | POA: Diagnosis not present

## 2018-01-14 DIAGNOSIS — F330 Major depressive disorder, recurrent, mild: Secondary | ICD-10-CM | POA: Diagnosis not present

## 2018-01-14 DIAGNOSIS — E782 Mixed hyperlipidemia: Secondary | ICD-10-CM | POA: Diagnosis not present

## 2018-01-14 DIAGNOSIS — G43009 Migraine without aura, not intractable, without status migrainosus: Secondary | ICD-10-CM | POA: Diagnosis not present

## 2018-01-14 DIAGNOSIS — K210 Gastro-esophageal reflux disease with esophagitis: Secondary | ICD-10-CM | POA: Diagnosis not present

## 2018-01-14 DIAGNOSIS — E1122 Type 2 diabetes mellitus with diabetic chronic kidney disease: Secondary | ICD-10-CM | POA: Diagnosis not present

## 2018-05-11 DIAGNOSIS — E1165 Type 2 diabetes mellitus with hyperglycemia: Secondary | ICD-10-CM | POA: Diagnosis not present

## 2018-05-11 DIAGNOSIS — K210 Gastro-esophageal reflux disease with esophagitis: Secondary | ICD-10-CM | POA: Diagnosis not present

## 2018-05-11 DIAGNOSIS — E781 Pure hyperglyceridemia: Secondary | ICD-10-CM | POA: Diagnosis not present

## 2018-05-11 DIAGNOSIS — E1122 Type 2 diabetes mellitus with diabetic chronic kidney disease: Secondary | ICD-10-CM | POA: Diagnosis not present

## 2018-05-11 DIAGNOSIS — E782 Mixed hyperlipidemia: Secondary | ICD-10-CM | POA: Diagnosis not present

## 2018-05-11 DIAGNOSIS — N183 Chronic kidney disease, stage 3 (moderate): Secondary | ICD-10-CM | POA: Diagnosis not present

## 2018-05-14 DIAGNOSIS — Z23 Encounter for immunization: Secondary | ICD-10-CM | POA: Diagnosis not present

## 2018-05-14 DIAGNOSIS — Z0001 Encounter for general adult medical examination with abnormal findings: Secondary | ICD-10-CM | POA: Diagnosis not present

## 2018-05-14 DIAGNOSIS — Z6830 Body mass index (BMI) 30.0-30.9, adult: Secondary | ICD-10-CM | POA: Diagnosis not present

## 2018-08-31 DIAGNOSIS — H40011 Open angle with borderline findings, low risk, right eye: Secondary | ICD-10-CM | POA: Diagnosis not present

## 2018-08-31 DIAGNOSIS — Z961 Presence of intraocular lens: Secondary | ICD-10-CM | POA: Diagnosis not present

## 2018-08-31 DIAGNOSIS — H401111 Primary open-angle glaucoma, right eye, mild stage: Secondary | ICD-10-CM | POA: Diagnosis not present

## 2018-08-31 DIAGNOSIS — H26493 Other secondary cataract, bilateral: Secondary | ICD-10-CM | POA: Diagnosis not present

## 2018-08-31 DIAGNOSIS — H40012 Open angle with borderline findings, low risk, left eye: Secondary | ICD-10-CM | POA: Diagnosis not present

## 2018-08-31 DIAGNOSIS — H401121 Primary open-angle glaucoma, left eye, mild stage: Secondary | ICD-10-CM | POA: Diagnosis not present

## 2018-09-07 DIAGNOSIS — F411 Generalized anxiety disorder: Secondary | ICD-10-CM | POA: Diagnosis not present

## 2018-09-07 DIAGNOSIS — F330 Major depressive disorder, recurrent, mild: Secondary | ICD-10-CM | POA: Diagnosis not present

## 2018-09-07 DIAGNOSIS — K210 Gastro-esophageal reflux disease with esophagitis: Secondary | ICD-10-CM | POA: Diagnosis not present

## 2018-09-07 DIAGNOSIS — E1165 Type 2 diabetes mellitus with hyperglycemia: Secondary | ICD-10-CM | POA: Diagnosis not present

## 2018-09-07 DIAGNOSIS — E782 Mixed hyperlipidemia: Secondary | ICD-10-CM | POA: Diagnosis not present

## 2018-09-07 DIAGNOSIS — E1140 Type 2 diabetes mellitus with diabetic neuropathy, unspecified: Secondary | ICD-10-CM | POA: Diagnosis not present

## 2018-09-07 DIAGNOSIS — E7800 Pure hypercholesterolemia, unspecified: Secondary | ICD-10-CM | POA: Diagnosis not present

## 2018-09-07 DIAGNOSIS — E1122 Type 2 diabetes mellitus with diabetic chronic kidney disease: Secondary | ICD-10-CM | POA: Diagnosis not present

## 2018-09-10 DIAGNOSIS — E1122 Type 2 diabetes mellitus with diabetic chronic kidney disease: Secondary | ICD-10-CM | POA: Diagnosis not present

## 2018-09-10 DIAGNOSIS — E1140 Type 2 diabetes mellitus with diabetic neuropathy, unspecified: Secondary | ICD-10-CM | POA: Diagnosis not present

## 2018-09-10 DIAGNOSIS — N183 Chronic kidney disease, stage 3 (moderate): Secondary | ICD-10-CM | POA: Diagnosis not present

## 2018-09-10 DIAGNOSIS — F330 Major depressive disorder, recurrent, mild: Secondary | ICD-10-CM | POA: Diagnosis not present

## 2018-09-10 DIAGNOSIS — Z6830 Body mass index (BMI) 30.0-30.9, adult: Secondary | ICD-10-CM | POA: Diagnosis not present

## 2018-09-10 DIAGNOSIS — G43009 Migraine without aura, not intractable, without status migrainosus: Secondary | ICD-10-CM | POA: Diagnosis not present

## 2018-09-10 DIAGNOSIS — K210 Gastro-esophageal reflux disease with esophagitis: Secondary | ICD-10-CM | POA: Diagnosis not present

## 2018-09-10 DIAGNOSIS — E782 Mixed hyperlipidemia: Secondary | ICD-10-CM | POA: Diagnosis not present

## 2019-01-04 DIAGNOSIS — E7800 Pure hypercholesterolemia, unspecified: Secondary | ICD-10-CM | POA: Diagnosis not present

## 2019-01-04 DIAGNOSIS — N183 Chronic kidney disease, stage 3 (moderate): Secondary | ICD-10-CM | POA: Diagnosis not present

## 2019-01-04 DIAGNOSIS — E1165 Type 2 diabetes mellitus with hyperglycemia: Secondary | ICD-10-CM | POA: Diagnosis not present

## 2019-01-04 DIAGNOSIS — E1122 Type 2 diabetes mellitus with diabetic chronic kidney disease: Secondary | ICD-10-CM | POA: Diagnosis not present

## 2019-01-04 DIAGNOSIS — K210 Gastro-esophageal reflux disease with esophagitis: Secondary | ICD-10-CM | POA: Diagnosis not present

## 2019-01-04 DIAGNOSIS — E1140 Type 2 diabetes mellitus with diabetic neuropathy, unspecified: Secondary | ICD-10-CM | POA: Diagnosis not present

## 2019-01-04 DIAGNOSIS — E782 Mixed hyperlipidemia: Secondary | ICD-10-CM | POA: Diagnosis not present

## 2019-01-04 DIAGNOSIS — F330 Major depressive disorder, recurrent, mild: Secondary | ICD-10-CM | POA: Diagnosis not present

## 2019-01-06 DIAGNOSIS — E782 Mixed hyperlipidemia: Secondary | ICD-10-CM | POA: Diagnosis not present

## 2019-01-06 DIAGNOSIS — E1122 Type 2 diabetes mellitus with diabetic chronic kidney disease: Secondary | ICD-10-CM | POA: Diagnosis not present

## 2019-01-06 DIAGNOSIS — K210 Gastro-esophageal reflux disease with esophagitis: Secondary | ICD-10-CM | POA: Diagnosis not present

## 2019-01-06 DIAGNOSIS — F411 Generalized anxiety disorder: Secondary | ICD-10-CM | POA: Diagnosis not present

## 2019-01-06 DIAGNOSIS — Z6829 Body mass index (BMI) 29.0-29.9, adult: Secondary | ICD-10-CM | POA: Diagnosis not present

## 2019-01-06 DIAGNOSIS — F330 Major depressive disorder, recurrent, mild: Secondary | ICD-10-CM | POA: Diagnosis not present

## 2019-01-06 DIAGNOSIS — G43009 Migraine without aura, not intractable, without status migrainosus: Secondary | ICD-10-CM | POA: Diagnosis not present

## 2019-01-06 DIAGNOSIS — E1140 Type 2 diabetes mellitus with diabetic neuropathy, unspecified: Secondary | ICD-10-CM | POA: Diagnosis not present

## 2019-03-15 DIAGNOSIS — Z6829 Body mass index (BMI) 29.0-29.9, adult: Secondary | ICD-10-CM | POA: Diagnosis not present

## 2019-03-15 DIAGNOSIS — M25521 Pain in right elbow: Secondary | ICD-10-CM | POA: Diagnosis not present

## 2019-03-15 DIAGNOSIS — R946 Abnormal results of thyroid function studies: Secondary | ICD-10-CM | POA: Diagnosis not present

## 2019-03-15 DIAGNOSIS — M25561 Pain in right knee: Secondary | ICD-10-CM | POA: Diagnosis not present

## 2019-03-15 DIAGNOSIS — R55 Syncope and collapse: Secondary | ICD-10-CM | POA: Diagnosis not present

## 2019-05-05 DIAGNOSIS — E1165 Type 2 diabetes mellitus with hyperglycemia: Secondary | ICD-10-CM | POA: Diagnosis not present

## 2019-05-05 DIAGNOSIS — E782 Mixed hyperlipidemia: Secondary | ICD-10-CM | POA: Diagnosis not present

## 2019-05-05 DIAGNOSIS — E7800 Pure hypercholesterolemia, unspecified: Secondary | ICD-10-CM | POA: Diagnosis not present

## 2019-05-05 DIAGNOSIS — K2100 Gastro-esophageal reflux disease with esophagitis, without bleeding: Secondary | ICD-10-CM | POA: Diagnosis not present

## 2019-05-05 DIAGNOSIS — E1122 Type 2 diabetes mellitus with diabetic chronic kidney disease: Secondary | ICD-10-CM | POA: Diagnosis not present

## 2019-05-05 DIAGNOSIS — F330 Major depressive disorder, recurrent, mild: Secondary | ICD-10-CM | POA: Diagnosis not present

## 2019-05-05 DIAGNOSIS — E1140 Type 2 diabetes mellitus with diabetic neuropathy, unspecified: Secondary | ICD-10-CM | POA: Diagnosis not present

## 2019-05-09 DIAGNOSIS — G43009 Migraine without aura, not intractable, without status migrainosus: Secondary | ICD-10-CM | POA: Diagnosis not present

## 2019-05-09 DIAGNOSIS — Z23 Encounter for immunization: Secondary | ICD-10-CM | POA: Diagnosis not present

## 2019-05-09 DIAGNOSIS — R55 Syncope and collapse: Secondary | ICD-10-CM | POA: Diagnosis not present

## 2019-05-09 DIAGNOSIS — Z6829 Body mass index (BMI) 29.0-29.9, adult: Secondary | ICD-10-CM | POA: Diagnosis not present

## 2019-05-09 DIAGNOSIS — Z0001 Encounter for general adult medical examination with abnormal findings: Secondary | ICD-10-CM | POA: Diagnosis not present

## 2019-05-09 DIAGNOSIS — E1122 Type 2 diabetes mellitus with diabetic chronic kidney disease: Secondary | ICD-10-CM | POA: Diagnosis not present

## 2019-05-09 DIAGNOSIS — F330 Major depressive disorder, recurrent, mild: Secondary | ICD-10-CM | POA: Diagnosis not present

## 2019-05-09 DIAGNOSIS — E1140 Type 2 diabetes mellitus with diabetic neuropathy, unspecified: Secondary | ICD-10-CM | POA: Diagnosis not present

## 2019-05-25 ENCOUNTER — Encounter: Payer: Self-pay | Admitting: Cardiology

## 2019-05-25 ENCOUNTER — Encounter: Payer: Self-pay | Admitting: *Deleted

## 2019-05-25 ENCOUNTER — Other Ambulatory Visit: Payer: Self-pay

## 2019-05-25 ENCOUNTER — Ambulatory Visit (INDEPENDENT_AMBULATORY_CARE_PROVIDER_SITE_OTHER): Payer: Medicare Other | Admitting: Cardiology

## 2019-05-25 ENCOUNTER — Telehealth: Payer: Self-pay | Admitting: Cardiology

## 2019-05-25 VITALS — BP 113/69 | HR 94 | Ht 65.5 in | Wt 186.6 lb

## 2019-05-25 DIAGNOSIS — E1165 Type 2 diabetes mellitus with hyperglycemia: Secondary | ICD-10-CM | POA: Diagnosis not present

## 2019-05-25 DIAGNOSIS — E782 Mixed hyperlipidemia: Secondary | ICD-10-CM

## 2019-05-25 DIAGNOSIS — Z87898 Personal history of other specified conditions: Secondary | ICD-10-CM

## 2019-05-25 DIAGNOSIS — R55 Syncope and collapse: Secondary | ICD-10-CM

## 2019-05-25 DIAGNOSIS — N1832 Chronic kidney disease, stage 3b: Secondary | ICD-10-CM

## 2019-05-25 NOTE — Progress Notes (Signed)
Cardiology Office Note  Date: 05/25/2019   ID: Chase GongCecil Drahos, DOB 07-01-1946, MRN [id number]  PCP:  Estanislado PandySasser, Paul W, MD  Consulting Cardiologist:  Nona DellSamuel Kyle Luppino, MD Electrophysiologist:  None   Chief Complaint  Patient presents with   History of syncope    History of Present Illness: Chase Malone is a 73 y.o. male referred for cardiology consultation by Dr. Neita CarpSasser for the evaluation of syncope.  He is here today with his wife.  He states that over the last few months he has had periodic episodes of lightheadedness that usually occur with standing.  He has had frank syncope as well.  He describes some of these episodes today.  One time he was laying on his back working under a car in his driveway, stood up and walked up the driveway and suddenly felt lightheaded followed by frank syncope and a fall to the ground.  He did not feel any chest pain or palpitations at that time.  Another episode occurred when he was chasing his grandson playing tag, reached out to tag him and suddenly blacked out.  He had another fall at his house that again occurred while standing.  He cannot predict when these episodes will happen.  Describes a mild headache sometimes after they occur.  No obvious seizure-like activity.  He has no prior history of cardiac arrhythmia.  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  Orthostatics today showed blood pressure 131/77 with heart rate 89 supine, blood pressure 115/75 with heart rate 101 seated, blood pressure 112/71 with heart rate 102 standing, and blood pressure 118/76 with heart rate 99 standing after 3 minutes.  I reviewed his recent lab work from October as outlined below.  We went over his medications.  Lasix was already stopped by Dr. Neita CarpSasser.  Flomax was cut back and we discussed stopping it altogether for now.  Question is whether he has autonomic dysfunction with intermittent symptomatic orthostasis, although possibility of a transient arrhythmia is also to  be considered based on description.  I personally reviewed his ECG today which shows sinus rhythm with normal intervals and nonspecific ST-T changes.  Past Medical History:  Diagnosis Date   Anxiety    Arthritis    CKD (chronic kidney disease) stage 3, GFR 30-59 ml/min    Depression    GERD (gastroesophageal reflux disease)    History of kidney stones    History of pneumonia 2013   Migraine    Type 2 diabetes mellitus (HCC)     Past Surgical History:  Procedure Laterality Date   bilateral cataract surgery      bilateral trigger fingers      2 fingers on each hand    CHOLECYSTECTOMY     left hand carpal tunnel      left knee surgery      right knee surgery      x 3   ROBOT ASSITED LAPAROSCOPIC NEPHROURETERECTOMY Right 06/20/2016   Procedure: XI ROBOT ASSITED LAPAROSCOPIC NEPHROURETERECTOMY extensive adhesiolysis;  Surgeon: Sebastian Acheheodore Manny, MD;  Location: [facility];  Service: Urology;  Laterality: Right;   VASECTOMY      Current Outpatient Medications  Medication Sig Dispense Refill   amitriptyline (ELAVIL) 10 MG tablet Take 10 mg by mouth at bedtime.     aspirin EC 81 MG tablet Take 81 mg by mouth daily.     cetirizine (ZYRTEC) 10 MG tablet Take 10 mg by mouth daily.     Cholecalciferol (VITAMIN D3) 50 MCG (2000  UT) TABS Take 1 tablet by mouth daily.     clonazePAM (KLONOPIN) 1 MG tablet Take 1 mg by mouth at bedtime.     FLUoxetine (PROZAC) 20 MG capsule Take 20 mg by mouth daily.     glipiZIDE (GLUCOTROL XL) 10 MG 24 hr tablet Take 10 mg by mouth daily with breakfast.     metFORMIN (GLUCOPHAGE-XR) 500 MG 24 hr tablet Take 100 mg by mouth every morning.      Omega-3 Fatty Acids (FISH OIL) 1000 MG CAPS Take 1,000 mg by mouth daily.     pantoprazole (PROTONIX) 40 MG tablet Take 40 mg by mouth 2 (two) times daily.     polyethylene glycol (MIRALAX / GLYCOLAX) packet Take 17 g by mouth daily as needed (for constipation).     pregabalin (LYRICA) 75 MG  capsule Take 75-150 mg by mouth 2 (two) times daily. 75 mg in the morning and 150 mg in the evening     simvastatin (ZOCOR) 20 MG tablet Take 20 mg by mouth daily.     sodium bicarbonate 650 MG tablet Take 650 mg by mouth every 6 (six) hours as needed for heartburn.     SUMAtriptan (IMITREX) 100 MG tablet Take 100 mg by mouth daily as needed for migraine. May repeat in 2 hours if headache persists or recurs.     timolol (BETIMOL) 0.5 % ophthalmic solution Place 1 drop into both eyes daily.     No current facility-administered medications for this visit.    Allergies:  Patient has no known allergies.   Social History: The patient  reports that he quit smoking about 45 years ago. His smoking use included cigarettes. He has never used smokeless tobacco. He reports that he does not drink alcohol or use drugs.   Family History: The patient's family history includes Heart disease in his father; Lung cancer in his mother; Seizures in his mother; Stroke in his father.   ROS:  Please see the history of present illness. Otherwise, complete review of systems is positive for none.  All other systems are reviewed and negative.   Physical Exam: VS:  BP 113/69    Pulse 94    Ht 5' 5.5" (1.664 m)    Wt 186 lb 9.6 oz (84.6 kg)    SpO2 95%    BMI 30.58 kg/m , BMI Body mass index is 30.58 kg/m.  Wt Readings from Last 3 Encounters:  05/25/19 186 lb 9.6 oz (84.6 kg)  06/20/16 187 lb (84.8 kg)  06/16/16 187 lb (84.8 kg)    General: Elderly male, appears comfortable at rest. HEENT: Conjunctiva and lids normal, wearing a mask. Neck: Supple, no elevated JVP or carotid bruits, no thyromegaly. Lungs: Clear to auscultation, nonlabored breathing at rest. Cardiac: Regular rate and rhythm, no S3 or significant systolic murmur. Abdomen: Soft, nontender, bowel sounds present. Extremities: No pitting edema, distal pulses 2+. Skin: Warm and dry. Musculoskeletal: No kyphosis. Neuropsychiatric: Alert and oriented  x3, affect grossly appropriate.  ECG:  An ECG dated September 2020 was personally reviewed today and demonstrated:  Sinus rhythm with nonspecific T wave changes.  Recent Labwork:  October 2020: BUN 15, creatinine 1.78, potassium 4.5, AST 15, ALT 13, hemoglobin A1c 7.6%, cholesterol 156, triglycerides 223, HDL 42, LDL 77  Other Studies Reviewed Today:  No prior cardiac testing for review.  Assessment and Plan:  1.  Recurrent syncope as described above.  I have asked him to stop Flomax completely, also reviewed adequate hydration  with him.  I suspect he does have some component of autonomic dysfunction with diabetes and peripheral neuropathy.  He was not frankly orthostatic today.  Based on the sudden description of some of these events, still need to exclude transient arrhythmia.  Will obtain an echocardiogram for cardiac structural assessment and start with a 14-day Zio patch.  2.  Type 2 diabetes mellitus, on Glucotrol XL and Glucophage XR.  Recent hemoglobin A1c 6.7%.  3.  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  LDL 77.  4.  CKD stage III, creatinine 1.78.  Medication Adjustments/Labs and Tests Ordered: Current medicines are reviewed at length with the patient today.  Concerns regarding medicines are outlined above.   Tests Ordered: Orders Placed This Encounter  Procedures   LONG TERM MONITOR (3-14 DAYS)   EKG 12-Lead   ECHOCARDIOGRAM COMPLETE    Medication Changes: No orders of the defined types were placed in this encounter.   Disposition:  Follow up follow-up 4 to 6 weeks.  Signed, Jonelle Sidle, MD, [facility] 05/25/2019 2:26 PM    [facility] [address] [place], [place], [place] [address] Phone: [phone]; Fax: [phone]

## 2019-05-25 NOTE — Patient Instructions (Addendum)
Medication Instructions:   Your physician has recommended you make the following change in your medication:   Stop tamsulosin (flomax)  Continue other medications the same  Labwork:  NONE  Testing/Procedures: Your physician has requested that you have an echocardiogram. Echocardiography is a painless test that uses sound waves to create images of your heart. It provides your doctor with information about the size and shape of your heart and how well your heart's chambers and valves are working. This procedure takes approximately one hour. There are no restrictions for this procedure. Your physician has recommended that you wear a holter monitor for 14 days. Holter monitors are medical devices that record the heart's electrical activity. Doctors most often use these monitors to diagnose arrhythmias. Arrhythmias are problems with the speed or rhythm of the heartbeat. The monitor is a small, portable device. You can wear one while you do your normal daily activities. This is usually used to diagnose what is causing palpitations/syncope (passing out). [employer] will send you this monitor.  Follow-Up:  Your physician recommends that you schedule a follow-up appointment in: 4-6 weeks.  Any Other Special Instructions Will Be Listed Below (If Applicable).  If you need a refill on your cardiac medications before your next appointment, please call your pharmacy.

## 2019-05-25 NOTE — Telephone Encounter (Signed)
Pre-cert Verification for the following procedure     Long term monitor 3-14 days.

## 2019-05-30 ENCOUNTER — Other Ambulatory Visit (INDEPENDENT_AMBULATORY_CARE_PROVIDER_SITE_OTHER): Payer: Medicare Other

## 2019-05-30 DIAGNOSIS — Z87898 Personal history of other specified conditions: Secondary | ICD-10-CM

## 2019-05-30 DIAGNOSIS — R55 Syncope and collapse: Secondary | ICD-10-CM | POA: Diagnosis not present

## 2019-06-08 ENCOUNTER — Ambulatory Visit (INDEPENDENT_AMBULATORY_CARE_PROVIDER_SITE_OTHER): Payer: Medicare Other

## 2019-06-08 ENCOUNTER — Other Ambulatory Visit: Payer: Self-pay

## 2019-06-08 DIAGNOSIS — R55 Syncope and collapse: Secondary | ICD-10-CM | POA: Diagnosis not present

## 2019-06-09 ENCOUNTER — Telehealth: Payer: Self-pay | Admitting: *Deleted

## 2019-06-09 ENCOUNTER — Other Ambulatory Visit: Payer: Medicare Other

## 2019-06-09 NOTE — Telephone Encounter (Signed)
Patient informed. Copy sent to PCP °

## 2019-06-09 NOTE — Telephone Encounter (Signed)
-----   Message from Satira Sark, MD sent at 06/08/2019 12:03 PM EST ----- Results reviewed.  Reassuring that LVEF is normal at 60 to 65%.  No major valvular abnormalities to explain symptoms either.

## 2019-07-06 ENCOUNTER — Telehealth: Payer: Self-pay | Admitting: Cardiology

## 2019-07-06 ENCOUNTER — Encounter: Payer: Self-pay | Admitting: *Deleted

## 2019-07-06 ENCOUNTER — Ambulatory Visit (INDEPENDENT_AMBULATORY_CARE_PROVIDER_SITE_OTHER): Payer: Medicare Other | Admitting: Cardiology

## 2019-07-06 ENCOUNTER — Other Ambulatory Visit: Payer: Self-pay

## 2019-07-06 ENCOUNTER — Encounter: Payer: Self-pay | Admitting: Cardiology

## 2019-07-06 VITALS — BP 135/77 | HR 71 | Ht 66.0 in | Wt 186.4 lb

## 2019-07-06 DIAGNOSIS — R079 Chest pain, unspecified: Secondary | ICD-10-CM | POA: Diagnosis not present

## 2019-07-06 DIAGNOSIS — N1832 Chronic kidney disease, stage 3b: Secondary | ICD-10-CM

## 2019-07-06 DIAGNOSIS — Z87898 Personal history of other specified conditions: Secondary | ICD-10-CM

## 2019-07-06 DIAGNOSIS — E782 Mixed hyperlipidemia: Secondary | ICD-10-CM

## 2019-07-06 NOTE — Patient Instructions (Signed)
Your physician recommends that you schedule a follow-up appointment in: PENDING TEST RESULTS WITH DR MCDOWELL  Your physician recommends that you continue on your current medications as directed. Please refer to the Current Medication list given to you today.  Your physician has requested that you have a lexiscan myoview. For further information please visit [email or web address]. Please follow instruction sheet, as given.  Thank you for choosing [facility]!!    

## 2019-07-06 NOTE — Telephone Encounter (Signed)
Pre-cert Verification for the following procedure    LEXISCAN scheduled for 07/12/2019 at [place]  

## 2019-07-06 NOTE — Progress Notes (Signed)
Cardiology Office Note  Date: 07/06/2019   ID: Chase Malone, DOB February 21, 1946, MRN [id number]  PCP:  Estanislado PandySasser, Paul W, MD  Cardiologist:  Nona DellSamuel Draya Felker, MD Electrophysiologist:  None   Chief Complaint  Patient presents with  . Cardiac follow-up    History of Present Illness: Chase Malone is a 73 y.o. male seen in consultation back in November.  He presents with his wife today for follow-up.  States that episodes of dizziness have lessened, he has not had any frank syncope.  Echocardiogram done recently indicated LVEF 60 to 65% with mild LVH and mild diastolic dysfunction, no major valvular abnormalities.  We discussed the results today.  Cardiac monitor showed sinus rhythm with rare PACs and PVCs.  There were a few brief episodes of SVT (not triggered events to suggest symptoms).  No pauses were noted.  We also went over these results.  At the end of our discussion, his wife had him bring up recent symptoms that are new.  He states that he has had recurring "sharp" left-sided chest pains over the last week or so.  Two of these episodes occurred with walking, one while he was watching television.  States that the discomfort is present for about 30 seconds to 45 seconds.  This is not associated with any feeling of palpitations or lightheadedness.  Past Medical History:  Diagnosis Date  . Anxiety   . Arthritis   . CKD (chronic kidney disease) stage 3, GFR 30-59 ml/min   . Depression   . GERD (gastroesophageal reflux disease)   . History of kidney stones   . History of pneumonia 2013  . Migraine   . Type 2 diabetes mellitus (HCC)     Past Surgical History:  Procedure Laterality Date  . bilateral cataract surgery     . bilateral trigger fingers      2 fingers on each hand   . CHOLECYSTECTOMY    . left hand carpal tunnel     . left knee surgery     . right knee surgery      x 3  . ROBOT ASSITED LAPAROSCOPIC NEPHROURETERECTOMY Right 06/20/2016   Procedure: XI ROBOT  ASSITED LAPAROSCOPIC NEPHROURETERECTOMY extensive adhesiolysis;  Surgeon: Sebastian Acheheodore Manny, MD;  Location: [facility];  Service: Urology;  Laterality: Right;  Marland Kitchen. VASECTOMY      Current Outpatient Medications  Medication Sig Dispense Refill  . amitriptyline (ELAVIL) 10 MG tablet Take 10 mg by mouth at bedtime.    Marland Kitchen. aspirin EC 81 MG tablet Take 81 mg by mouth daily.    . cetirizine (ZYRTEC) 10 MG tablet Take 10 mg by mouth daily.    . Cholecalciferol (VITAMIN D3) 50 MCG (2000 UT) TABS Take 1 tablet by mouth daily.    . clonazePAM (KLONOPIN) 1 MG tablet Take 1 mg by mouth at bedtime.    Marland Kitchen. FLUoxetine (PROZAC) 20 MG capsule Take 20 mg by mouth daily.    Marland Kitchen. glipiZIDE (GLUCOTROL XL) 10 MG 24 hr tablet Take 10 mg by mouth daily with breakfast.    . metFORMIN (GLUCOPHAGE-XR) 500 MG 24 hr tablet Take 100 mg by mouth every morning.     . Omega-3 Fatty Acids (FISH OIL) 1000 MG CAPS Take 1,000 mg by mouth daily.    . pantoprazole (PROTONIX) 40 MG tablet Take 80 mg by mouth every morning.     . polyethylene glycol (MIRALAX / GLYCOLAX) packet Take 17 g by mouth daily as needed (for constipation).    .Marland Kitchen  pregabalin (LYRICA) 75 MG capsule Take 75-150 mg by mouth 2 (two) times daily. 75 mg in the morning and 150 mg in the evening    . simvastatin (ZOCOR) 20 MG tablet Take 20 mg by mouth daily.    . sodium bicarbonate 650 MG tablet Take 650 mg by mouth every 6 (six) hours as needed for heartburn.    . SUMAtriptan (IMITREX) 100 MG tablet Take 100 mg by mouth daily as needed for migraine. May repeat in 2 hours if headache persists or recurs.    . timolol (BETIMOL) 0.5 % ophthalmic solution Place 1 drop into both eyes daily.     No current facility-administered medications for this visit.   Allergies:  Patient has no known allergies.   Social History: The patient  reports that he quit smoking about 46 years ago. His smoking use included cigarettes. He has never used smokeless tobacco. He reports that he does not drink  alcohol or use drugs.   ROS:  Please see the history of present illness. Otherwise, complete review of systems is positive for none.  All other systems are reviewed and negative.   Physical Exam: VS:  BP 135/77   Pulse 71   Ht 5\' 6"  (1.676 m)   Wt 186 lb 6.4 oz (84.6 kg)   SpO2 97%   BMI 30.09 kg/m , BMI Body mass index is 30.09 kg/m.  Wt Readings from Last 3 Encounters:  07/06/19 186 lb 6.4 oz (84.6 kg)  05/25/19 186 lb 9.6 oz (84.6 kg)  06/20/16 187 lb (84.8 kg)    General: Elderly male, appears comfortable at rest. HEENT: Conjunctiva and lids normal, wearing a mask. Neck: Supple, no elevated JVP or carotid bruits, no thyromegaly. Lungs: Clear to auscultation, nonlabored breathing at rest. Cardiac: Regular rate and rhythm, no S3 or significant systolic murmur. Abdomen: Protuberant, nontender, bowel sounds present. Extremities: No pitting edema, distal pulses 2+. Skin: Warm and dry. Musculoskeletal: No kyphosis. Neuropsychiatric: Alert and oriented x3, affect grossly appropriate.  ECG:  An ECG dated 05/25/2019 was personally reviewed today and demonstrated:  Sinus rhythm with nonspecific ST-T changes.  Recent Labwork:  October 2020: BUN 15, creatinine 1.78, potassium 4.5, AST 15, ALT 13, hemoglobin A1c 7.6%, cholesterol 156, triglycerides 223, HDL 42, LDL 77  Other Studies Reviewed Today:  Echocardiogram 06/08/2019:  1. Left ventricular ejection fraction, by visual estimation, is 60 to 65%. The left ventricle has normal function. There is mildly increased left ventricular hypertrophy.  2. Left ventricular diastolic parameters are consistent with Grade I diastolic dysfunction (impaired relaxation).  3. The left ventricle has no regional wall motion abnormalities.  4. Global right ventricle has normal systolic function.The right ventricular size is normal. No increase in right ventricular wall thickness.  5. Left atrial size was normal.  6. Right atrial size was normal.  7.  Moderate aortic valve annular calcification.  8. The mitral valve is grossly normal. Mild mitral valve regurgitation.  9. The tricuspid valve is grossly normal. Tricuspid valve regurgitation is trivial. 10. The aortic valve is tricuspid. Aortic valve regurgitation is not visualized. Mild aortic valve sclerosis without stenosis. 11. The pulmonic valve was grossly normal. Pulmonic valve regurgitation is not visualized. 12. Normal pulmonary artery systolic pressure. 13. The inferior vena cava is normal in size with greater than 50% respiratory variability, suggesting right atrial pressure of 3 mmHg.  Event monitor 05/31/2019: Zio patch reviewed.  13 days 21 hours analyzed.  Predominant rhythm is sinus with heart rate ranging  from 57 bpm up to 127 bpm, and average heart rate 78 bpm.  Rare PACs and PVCs were noted representing less than 1% total beats.  He did have a few brief episodes of PSVT ranging from 6-11 beats, none were sustained (these were not patient triggered events).  There were no pauses.  Assessment and Plan:  1.  Newly reported episodes of recurring chest discomfort as described above.  Largely typical features for angina, but not exclusively.  He does not relate this to any of his previous symptoms of lightheadedness and he has had no recent syncope.  Cardiac risk factors include age and gender along with type 2 diabetes mellitus and hyperlipidemia.  LVEF is normal by recent echocardiogram.  We will investigate ischemic heart disease with a Lexiscan Myoview.  I told him that if symptoms escalate in the interim he should seek more urgent evaluation in the ER.  2.  History of dizziness and prior syncope.  He has had no recurrence and generally a lessening of symptoms.  We took him off Flomax previously and I talked with him about hydration with suspected component of autonomic dysfunction in the setting of diabetes and peripheral neuropathy.  Cardiac monitor did not demonstrate any sustained  arrhythmias or pauses.  The brief episodes of SVT were not clearly symptomatic.  3.  Type 2 diabetes mellitus, he continues on Glucotrol XL and Glucophage XR.  4.  CKD stage III, creatinine 1.78.  5.  Mixed hyperlipidemia, continues on Zocor.  Recent LDL 77.  Medication Adjustments/Labs and Tests Ordered: Current medicines are reviewed at length with the patient today.  Concerns regarding medicines are outlined above.   Tests Ordered: Orders Placed This Encounter  Procedures  . NM Myocar Multi W/Spect W/Wall Motion / EF    Medication Changes: No orders of the defined types were placed in this encounter.   Disposition:  Follow up test results and determine next step.  Signed, Satira Sark, MD, [facility] 07/06/2019 2:31 PM    [facility] [address], [place], [place] [address] Phone: [phone]; Fax: [phone]

## 2019-07-07 ENCOUNTER — Telehealth: Payer: Self-pay | Admitting: Cardiology

## 2019-07-07 NOTE — Telephone Encounter (Signed)
Mr. Ducharme called [facility] office stating that he wants to postpone his stress test for next week. States that he is not wanting to go into the hospital.

## 2019-07-11 NOTE — Telephone Encounter (Signed)
Noted Please cancel and reschedule

## 2019-07-12 ENCOUNTER — Encounter (HOSPITAL_COMMUNITY): Payer: Medicare Other

## 2019-07-13 NOTE — Telephone Encounter (Signed)
Spoke with Chase Malone.  He does not want to schedule his stress trest at this time due to COVID. He will call the office back.

## 2019-08-12 DIAGNOSIS — Z23 Encounter for immunization: Secondary | ICD-10-CM | POA: Diagnosis not present

## 2019-08-31 DIAGNOSIS — E7800 Pure hypercholesterolemia, unspecified: Secondary | ICD-10-CM | POA: Diagnosis not present

## 2019-08-31 DIAGNOSIS — E1122 Type 2 diabetes mellitus with diabetic chronic kidney disease: Secondary | ICD-10-CM | POA: Diagnosis not present

## 2019-08-31 DIAGNOSIS — E1165 Type 2 diabetes mellitus with hyperglycemia: Secondary | ICD-10-CM | POA: Diagnosis not present

## 2019-08-31 DIAGNOSIS — K2100 Gastro-esophageal reflux disease with esophagitis, without bleeding: Secondary | ICD-10-CM | POA: Diagnosis not present

## 2019-08-31 DIAGNOSIS — E1140 Type 2 diabetes mellitus with diabetic neuropathy, unspecified: Secondary | ICD-10-CM | POA: Diagnosis not present

## 2019-08-31 DIAGNOSIS — N183 Chronic kidney disease, stage 3 unspecified: Secondary | ICD-10-CM | POA: Diagnosis not present

## 2019-09-07 DIAGNOSIS — E782 Mixed hyperlipidemia: Secondary | ICD-10-CM | POA: Diagnosis not present

## 2019-09-07 DIAGNOSIS — Z6830 Body mass index (BMI) 30.0-30.9, adult: Secondary | ICD-10-CM | POA: Diagnosis not present

## 2019-09-07 DIAGNOSIS — G43009 Migraine without aura, not intractable, without status migrainosus: Secondary | ICD-10-CM | POA: Diagnosis not present

## 2019-09-07 DIAGNOSIS — E1140 Type 2 diabetes mellitus with diabetic neuropathy, unspecified: Secondary | ICD-10-CM | POA: Diagnosis not present

## 2019-09-07 DIAGNOSIS — F411 Generalized anxiety disorder: Secondary | ICD-10-CM | POA: Diagnosis not present

## 2019-09-07 DIAGNOSIS — F330 Major depressive disorder, recurrent, mild: Secondary | ICD-10-CM | POA: Diagnosis not present

## 2019-09-07 DIAGNOSIS — E1122 Type 2 diabetes mellitus with diabetic chronic kidney disease: Secondary | ICD-10-CM | POA: Diagnosis not present

## 2019-09-10 DIAGNOSIS — Z23 Encounter for immunization: Secondary | ICD-10-CM | POA: Diagnosis not present

## 2020-05-03 DIAGNOSIS — E782 Mixed hyperlipidemia: Secondary | ICD-10-CM | POA: Diagnosis not present

## 2020-05-03 DIAGNOSIS — K2100 Gastro-esophageal reflux disease with esophagitis, without bleeding: Secondary | ICD-10-CM | POA: Diagnosis not present

## 2020-05-03 DIAGNOSIS — N183 Chronic kidney disease, stage 3 unspecified: Secondary | ICD-10-CM | POA: Diagnosis not present

## 2020-05-03 DIAGNOSIS — E1122 Type 2 diabetes mellitus with diabetic chronic kidney disease: Secondary | ICD-10-CM | POA: Diagnosis not present

## 2020-05-08 DIAGNOSIS — G43009 Migraine without aura, not intractable, without status migrainosus: Secondary | ICD-10-CM | POA: Diagnosis not present

## 2020-05-08 DIAGNOSIS — E782 Mixed hyperlipidemia: Secondary | ICD-10-CM | POA: Diagnosis not present

## 2020-05-08 DIAGNOSIS — Z0001 Encounter for general adult medical examination with abnormal findings: Secondary | ICD-10-CM | POA: Diagnosis not present

## 2020-05-08 DIAGNOSIS — E1122 Type 2 diabetes mellitus with diabetic chronic kidney disease: Secondary | ICD-10-CM | POA: Diagnosis not present

## 2020-05-08 DIAGNOSIS — Z6828 Body mass index (BMI) 28.0-28.9, adult: Secondary | ICD-10-CM | POA: Diagnosis not present

## 2020-05-08 DIAGNOSIS — Z23 Encounter for immunization: Secondary | ICD-10-CM | POA: Diagnosis not present

## 2020-05-08 DIAGNOSIS — F330 Major depressive disorder, recurrent, mild: Secondary | ICD-10-CM | POA: Diagnosis not present

## 2020-05-08 DIAGNOSIS — E1140 Type 2 diabetes mellitus with diabetic neuropathy, unspecified: Secondary | ICD-10-CM | POA: Diagnosis not present

## 2020-05-23 DIAGNOSIS — Z23 Encounter for immunization: Secondary | ICD-10-CM | POA: Diagnosis not present

## 2020-11-19 ENCOUNTER — Other Ambulatory Visit: Payer: Self-pay | Admitting: Orthopedic Surgery

## 2021-01-01 ENCOUNTER — Other Ambulatory Visit: Payer: Self-pay

## 2021-01-01 ENCOUNTER — Encounter (HOSPITAL_BASED_OUTPATIENT_CLINIC_OR_DEPARTMENT_OTHER): Payer: Self-pay | Admitting: Orthopedic Surgery

## 2021-01-08 ENCOUNTER — Encounter (HOSPITAL_BASED_OUTPATIENT_CLINIC_OR_DEPARTMENT_OTHER)
Admission: RE | Admit: 2021-01-08 | Discharge: 2021-01-08 | Disposition: A | Discharge status: Home / Self Care | Payer: Medicare Other | Source: Ambulatory Visit | Attending: Orthopedic Surgery | Admitting: Orthopedic Surgery

## 2021-01-08 DIAGNOSIS — Z7984 Long term (current) use of oral hypoglycemic drugs: Secondary | ICD-10-CM | POA: Diagnosis not present

## 2021-01-08 DIAGNOSIS — M67442 Ganglion, left hand: Secondary | ICD-10-CM | POA: Diagnosis not present

## 2021-01-08 DIAGNOSIS — M65322 Trigger finger, left index finger: Secondary | ICD-10-CM | POA: Diagnosis not present

## 2021-01-08 DIAGNOSIS — Z79899 Other long term (current) drug therapy: Secondary | ICD-10-CM | POA: Diagnosis not present

## 2021-01-08 DIAGNOSIS — Z905 Acquired absence of kidney: Secondary | ICD-10-CM | POA: Diagnosis not present

## 2021-01-08 DIAGNOSIS — Z7982 Long term (current) use of aspirin: Secondary | ICD-10-CM | POA: Diagnosis not present

## 2021-01-08 DIAGNOSIS — Z87891 Personal history of nicotine dependence: Secondary | ICD-10-CM | POA: Diagnosis not present

## 2021-01-08 LAB — BASIC METABOLIC PANEL
Anion gap: 10 (ref 5–15)
BUN: 12 mg/dL (ref 8–23)
CO2: 21 mmol/L — ABNORMAL LOW (ref 22–32)
Calcium: 9.4 mg/dL (ref 8.9–10.3)
Chloride: 102 mmol/L (ref 98–111)
Creatinine, Ser: 1.67 mg/dL — ABNORMAL HIGH (ref 0.61–1.24)
GFR, Estimated: 42 mL/min — ABNORMAL LOW (ref >60)
Glucose, Bld: 204 mg/dL — ABNORMAL HIGH (ref 70–99)
Potassium: 4.7 mmol/L (ref 3.5–5.1)
Sodium: 133 mmol/L — ABNORMAL LOW (ref 135–145)

## 2021-01-08 NOTE — Progress Notes (Signed)

## 2021-01-10 ENCOUNTER — Ambulatory Visit (HOSPITAL_BASED_OUTPATIENT_CLINIC_OR_DEPARTMENT_OTHER): Payer: Medicare Other | Admitting: Anesthesiology

## 2021-01-10 ENCOUNTER — Other Ambulatory Visit: Payer: Self-pay

## 2021-01-10 ENCOUNTER — Encounter (HOSPITAL_BASED_OUTPATIENT_CLINIC_OR_DEPARTMENT_OTHER): Payer: Self-pay | Admitting: Orthopedic Surgery

## 2021-01-10 ENCOUNTER — Encounter (HOSPITAL_BASED_OUTPATIENT_CLINIC_OR_DEPARTMENT_OTHER)
Admission: RE | Disposition: A | Discharge status: Home / Self Care | Payer: Self-pay | Source: Home / Self Care | Attending: Orthopedic Surgery

## 2021-01-10 ENCOUNTER — Ambulatory Visit (HOSPITAL_BASED_OUTPATIENT_CLINIC_OR_DEPARTMENT_OTHER)
Admission: RE | Admit: 2021-01-10 | Discharge: 2021-01-10 | Disposition: A | Discharge status: Home / Self Care | Payer: Medicare Other | Attending: Orthopedic Surgery | Admitting: Orthopedic Surgery

## 2021-01-10 DIAGNOSIS — M67442 Ganglion, left hand: Secondary | ICD-10-CM | POA: Diagnosis not present

## 2021-01-10 DIAGNOSIS — M65322 Trigger finger, left index finger: Secondary | ICD-10-CM | POA: Diagnosis not present

## 2021-01-10 DIAGNOSIS — Z87891 Personal history of nicotine dependence: Secondary | ICD-10-CM | POA: Insufficient documentation

## 2021-01-10 DIAGNOSIS — Z905 Acquired absence of kidney: Secondary | ICD-10-CM | POA: Insufficient documentation

## 2021-01-10 DIAGNOSIS — Z7982 Long term (current) use of aspirin: Secondary | ICD-10-CM | POA: Insufficient documentation

## 2021-01-10 DIAGNOSIS — Z7984 Long term (current) use of oral hypoglycemic drugs: Secondary | ICD-10-CM | POA: Insufficient documentation

## 2021-01-10 DIAGNOSIS — Z79899 Other long term (current) drug therapy: Secondary | ICD-10-CM | POA: Insufficient documentation

## 2021-01-10 HISTORY — PX: TRIGGER FINGER RELEASE: SHX641

## 2021-01-10 LAB — GLUCOSE, CAPILLARY
Glucose-Capillary: 216 mg/dL — ABNORMAL HIGH (ref 70–99)
Glucose-Capillary: 239 mg/dL — ABNORMAL HIGH (ref 70–99)

## 2021-01-10 SURGERY — RELEASE, A1 PULLEY, FOR TRIGGER FINGER
Anesthesia: Monitor Anesthesia Care | Site: Hand | Laterality: Left

## 2021-01-10 MED ORDER — BUPIVACAINE HCL (PF) 0.25 % IJ SOLN
INTRAMUSCULAR | Status: DC | PRN
  Administered 2021-01-10: 7 mL

## 2021-01-10 MED ORDER — ONDANSETRON HCL 4 MG/2ML IJ SOLN
INTRAMUSCULAR | Status: AC
  Filled 2021-01-10: qty 2

## 2021-01-10 MED ORDER — ONDANSETRON HCL 4 MG/2ML IJ SOLN
INTRAMUSCULAR | Status: DC | PRN
  Administered 2021-01-10: 4 mg via INTRAVENOUS

## 2021-01-10 MED ORDER — HYDROCODONE-ACETAMINOPHEN 5-325 MG PO TABS: ORAL_TABLET | ORAL | 0 refills | Status: DC

## 2021-01-10 MED ORDER — CEFAZOLIN SODIUM-DEXTROSE 2-4 GM/100ML-% IV SOLN
2.0000 g | INTRAVENOUS | Status: AC
  Administered 2021-01-10: 2 g via INTRAVENOUS

## 2021-01-10 MED ORDER — FENTANYL CITRATE (PF) 100 MCG/2ML IJ SOLN: 25.0000 ug | INTRAMUSCULAR | Status: DC | PRN

## 2021-01-10 MED ORDER — OXYCODONE HCL 5 MG/5ML PO SOLN: 5.0000 mg | Freq: Once | ORAL | Status: DC | PRN

## 2021-01-10 MED ORDER — PROPOFOL 10 MG/ML IV BOLUS
INTRAVENOUS | Status: AC
  Filled 2021-01-10: qty 20

## 2021-01-10 MED ORDER — 0.9 % SODIUM CHLORIDE (POUR BTL) OPTIME
TOPICAL | Status: DC | PRN
  Administered 2021-01-10: 120 mL

## 2021-01-10 MED ORDER — OXYCODONE HCL 5 MG PO TABS: 5.0000 mg | ORAL_TABLET | Freq: Once | ORAL | Status: DC | PRN

## 2021-01-10 MED ORDER — PROPOFOL 10 MG/ML IV BOLUS
INTRAVENOUS | Status: DC | PRN
  Administered 2021-01-10: 20 mg via INTRAVENOUS

## 2021-01-10 MED ORDER — LACTATED RINGERS IV SOLN: INTRAVENOUS | Status: DC

## 2021-01-10 MED ORDER — ONDANSETRON HCL 4 MG/2ML IJ SOLN: 4.0000 mg | Freq: Once | INTRAMUSCULAR | Status: DC | PRN

## 2021-01-10 MED ORDER — LIDOCAINE HCL (PF) 0.5 % IJ SOLN
INTRAMUSCULAR | Status: DC | PRN
  Administered 2021-01-10: 30 mL via INTRAVENOUS

## 2021-01-10 MED ORDER — PROPOFOL 500 MG/50ML IV EMUL
INTRAVENOUS | Status: DC | PRN
  Administered 2021-01-10: 50 ug/kg/min via INTRAVENOUS

## 2021-01-10 MED ORDER — CEFAZOLIN SODIUM-DEXTROSE 2-4 GM/100ML-% IV SOLN
INTRAVENOUS | Status: AC
  Filled 2021-01-10: qty 100

## 2021-01-10 MED ORDER — FENTANYL CITRATE (PF) 100 MCG/2ML IJ SOLN
INTRAMUSCULAR | Status: AC
  Filled 2021-01-10: qty 2

## 2021-01-10 MED ORDER — FENTANYL CITRATE (PF) 100 MCG/2ML IJ SOLN
INTRAMUSCULAR | Status: DC | PRN
  Administered 2021-01-10 (×2): 50 ug via INTRAVENOUS

## 2021-01-10 SURGICAL SUPPLY — 34 items
APL PRP STRL LF DISP 70% ISPRP (MISCELLANEOUS) ×1
BLADE SURG 15 STRL LF DISP TIS (BLADE) ×2 IMPLANT
BLADE SURG 15 STRL SS (BLADE) ×4
BNDG CMPR 9X4 STRL LF SNTH (GAUZE/BANDAGES/DRESSINGS) ×1
BNDG COHESIVE 2X5 TAN STRL LF (GAUZE/BANDAGES/DRESSINGS) ×2 IMPLANT
BNDG ESMARK 4X9 LF (GAUZE/BANDAGES/DRESSINGS) ×2 IMPLANT
CHLORAPREP W/TINT 26 (MISCELLANEOUS) ×2 IMPLANT
CORD BIPOLAR FORCEPS 12FT (ELECTRODE) ×2 IMPLANT
COVER BACK TABLE 60X90IN (DRAPES) ×2 IMPLANT
COVER MAYO STAND STRL (DRAPES) ×2 IMPLANT
CUFF TOURN SGL QUICK 18X4 (TOURNIQUET CUFF) ×2 IMPLANT
DRAPE EXTREMITY T 121X128X90 (DISPOSABLE) ×2 IMPLANT
DRAPE SURG 17X23 STRL (DRAPES) ×2 IMPLANT
GAUZE 4X4 16PLY ~~LOC~~+RFID DBL (SPONGE) ×2 IMPLANT
GAUZE SPONGE 4X4 12PLY STRL (GAUZE/BANDAGES/DRESSINGS) ×2 IMPLANT
GAUZE XEROFORM 1X8 LF (GAUZE/BANDAGES/DRESSINGS) ×2 IMPLANT
GLOVE SRG 8 PF TXTR STRL LF DI (GLOVE) ×1 IMPLANT
GLOVE SURG ENC MOIS LTX SZ7.5 (GLOVE) ×2 IMPLANT
GLOVE SURG POLYISO LF SZ8 (GLOVE) ×2 IMPLANT
GLOVE SURG UNDER POLY LF SZ7 (GLOVE) ×2 IMPLANT
GLOVE SURG UNDER POLY LF SZ8 (GLOVE) ×2
GOWN STRL REUS W/ TWL LRG LVL3 (GOWN DISPOSABLE) IMPLANT
GOWN STRL REUS W/ TWL XL LVL3 (GOWN DISPOSABLE) ×1 IMPLANT
GOWN STRL REUS W/TWL LRG LVL3 (GOWN DISPOSABLE)
GOWN STRL REUS W/TWL XL LVL3 (GOWN DISPOSABLE) ×4 IMPLANT
NEEDLE HYPO 25X1 1.5 SAFETY (NEEDLE) ×2 IMPLANT
NS IRRIG 1000ML POUR BTL (IV SOLUTION) ×2 IMPLANT
PACK BASIN DAY SURGERY FS (CUSTOM PROCEDURE TRAY) ×2 IMPLANT
STOCKINETTE 4X48 STRL (DRAPES) ×2 IMPLANT
SUT ETHILON 4 0 PS 2 18 (SUTURE) ×2 IMPLANT
SYR BULB EAR ULCER 3OZ GRN STR (SYRINGE) ×2 IMPLANT
SYR CONTROL 10ML LL (SYRINGE) ×2 IMPLANT
TOWEL GREEN STERILE FF (TOWEL DISPOSABLE) ×2 IMPLANT
UNDERPAD 30X36 HEAVY ABSORB (UNDERPADS AND DIAPERS) ×2 IMPLANT

## 2021-01-10 NOTE — Anesthesia Postprocedure Evaluation (Signed)
Anesthesia Post Note  Patient: Chase Malone  Procedure(s) Performed: RELEASE TRIGGER FINGER/A-1 PULLEY LEFT INDEX TRIGGER RLEASE AND INCISION ANNULAR LIGAMENT CYST (Left: Hand)     Patient location during evaluation: PACU Anesthesia Type: MAC and Bier Block Level of consciousness: awake and alert and oriented Pain management: pain level controlled Vital Signs Assessment: post-procedure vital signs reviewed and stable Respiratory status: spontaneous breathing, nonlabored ventilation and respiratory function stable Cardiovascular status: stable and blood pressure returned to baseline Postop Assessment: no apparent nausea or vomiting Anesthetic complications: no   No notable events documented.  Last Vitals:  Vitals:   01/10/21 0945 01/10/21 0956  BP: (!) 142/91 (!) 142/88  Pulse: 80 76  Resp: 10 14  Temp:    SpO2: 98% 95%    Last Pain:  Vitals:   01/10/21 0956  TempSrc:   PainSc: 0-No pain                 [name] A.

## 2021-01-10 NOTE — Anesthesia Procedure Notes (Signed)
Anesthesia Regional Block: Bier block (IV Regional)   Pre-Anesthetic Checklist: , timeout performed,  Correct Patient, Correct Site, Correct Laterality,  Correct Procedure,, site marked,  Surgical consent,  At surgeon's request  Laterality: Left         Needles:  Injection technique: Single-shot  Needle Type: Other      Needle Gauge: 22     Additional Needles:   Procedures:,,,,, intact distal pulses, Esmarch exsanguination,  Single tourniquet utilized    Narrative:  Start time: 01/10/2021 9:08 AM End time: 01/10/2021 9:08 AM  Performed by: Personally

## 2021-01-10 NOTE — H&P (Signed)
Chase Malone is an 75 y.o. male.   Chief Complaint: trigger finger and cyst HPI: 75 yo male with triggering left index finger.  It has not resolved with injections.  Palpable cyst in finger at proximal flexion crease.  He wishes to have trigger release and excision of annular ligament cyst.  Allergies: No Known Allergies  Past Medical History:  Diagnosis Date   Anxiety    Arthritis    CKD (chronic kidney disease) stage 3, GFR 30-59 ml/min (HCC)    Depression    GERD (gastroesophageal reflux disease)    History of kidney stones    History of pneumonia 2013   Migraine    Type 2 diabetes mellitus (HCC)     Past Surgical History:  Procedure Laterality Date   bilateral cataract surgery      bilateral trigger fingers      2 fingers on each hand    CHOLECYSTECTOMY     left hand carpal tunnel      left knee surgery      right knee surgery      x 3   ROBOT ASSITED LAPAROSCOPIC NEPHROURETERECTOMY Right 06/20/2016   Procedure: XI ROBOT ASSITED LAPAROSCOPIC NEPHROURETERECTOMY extensive adhesiolysis;  Surgeon: Sebastian Ache, MD;  Location: [facility];  Service: Urology;  Laterality: Right;   VASECTOMY      Family History: Family History  Problem Relation Age of Onset   Seizures Mother    Lung cancer Mother    Heart disease Father    Stroke Father     Social History:   reports that he quit smoking about 47 years ago. His smoking use included cigarettes. He has never used smokeless tobacco. He reports that he does not drink alcohol and does not use drugs.  Medications: Medications Prior to Admission  Medication Sig Dispense Refill   amitriptyline (ELAVIL) 10 MG tablet Take 10 mg by mouth at bedtime.     aspirin EC 81 MG tablet Take 81 mg by mouth daily.     cetirizine (ZYRTEC) 10 MG tablet Take 10 mg by mouth daily.     Cholecalciferol (VITAMIN D3) 50 MCG (2000 UT) TABS Take 1 tablet by mouth daily.     clonazePAM (KLONOPIN) 1 MG tablet Take 1 mg by mouth at bedtime.      FLUoxetine (PROZAC) 20 MG capsule Take 20 mg by mouth daily.     metFORMIN (GLUCOPHAGE-XR) 500 MG 24 hr tablet Take 1,000 mg by mouth every morning.     Omega-3 Fatty Acids (FISH OIL) 1000 MG CAPS Take 1,000 mg by mouth daily.     pantoprazole (PROTONIX) 40 MG tablet Take 80 mg by mouth every morning.      polyethylene glycol (MIRALAX / GLYCOLAX) packet Take 17 g by mouth daily as needed (for constipation).     pregabalin (LYRICA) 75 MG capsule Take 75-150 mg by mouth 2 (two) times daily. 75 mg in the morning and 150 mg in the evening     simvastatin (ZOCOR) 20 MG tablet Take 20 mg by mouth daily.     sodium bicarbonate 650 MG tablet Take 650 mg by mouth every 6 (six) hours as needed for heartburn.     SUMAtriptan (IMITREX) 100 MG tablet Take 100 mg by mouth daily as needed for migraine. May repeat in 2 hours if headache persists or recurs.     timolol (BETIMOL) 0.5 % ophthalmic solution Place 1 drop into both eyes daily.      Results  for orders placed or performed during the hospital encounter of 01/10/21 (from the past 48 hour(s))  Glucose, capillary     Status: Abnormal   Collection Time: 01/10/21  7:31 AM  Result Value Ref Range   Glucose-Capillary 216 (H) 70 - 99 mg/dL    Comment: Glucose reference range applies only to samples taken after fasting for at least 8 hours.    No results found.   A comprehensive review of systems was negative.  Blood pressure 139/71, pulse 81, temperature 97.8 F (36.6 C), temperature source Oral, resp. rate 16, height 5\' 6"  (1.676 m), weight 83 kg, SpO2 98 %.  General appearance: alert, cooperative, and appears stated age Head: Normocephalic, without obvious abnormality, atraumatic Neck: supple, symmetrical, trachea midline Cardio: regular rate and rhythm Resp: clear to auscultation bilaterally Extremities: Intact sensation and capillary refill all digits.  +epl/fpl/io.  No wounds.  Pulses: 2+ and symmetric Skin: Skin color, texture, turgor  normal. No rashes or lesions Neurologic: Grossly normal Incision/Wound: none  Assessment/Plan Left index finger trigger digit and annular ligament cyst.  Non operative and operative treatment options have been discussed with the patient and patient wishes to proceed with operative treatment. Risks, benefits, and alternatives of surgery have been discussed and the patient agrees with the plan of care.   01/10/2021, 8:44 AM

## 2021-01-10 NOTE — Discharge Instructions (Addendum)

## 2021-01-10 NOTE — Anesthesia Preprocedure Evaluation (Signed)
Anesthesia Evaluation  Patient identified by MRN, date of birth, ID band Patient awake    Reviewed: Allergy & Precautions, NPO status , Patient's Chart, lab work & pertinent test results  Airway Mallampati: II  TM Distance: >3 FB Neck ROM: Full    Dental no notable dental hx. (+) Teeth Intact, Caps, Dental Advisory Given   Pulmonary former smoker,    Pulmonary exam normal breath sounds clear to auscultation       Cardiovascular negative cardio ROS Normal cardiovascular exam Rhythm:Regular Rate:Normal     Neuro/Psych  Headaches, PSYCHIATRIC DISORDERS Anxiety Depression    GI/Hepatic GERD  Medicated,  Endo/Other  diabetes, Well Controlled, Type 2, Oral Hypoglycemic AgentsHyperlipidemia  Renal/GU Renal InsufficiencyRenal diseaseSolitary left kidney  Hx/o nephrolithiasis  negative genitourinary   Musculoskeletal  (+) Arthritis , Osteoarthritis,  Left index trigger finger with annular cyst   Abdominal   Peds  Hematology negative hematology ROS (+)   Anesthesia Other Findings   Reproductive/Obstetrics                             Anesthesia Physical Anesthesia Plan  ASA: 2  Anesthesia Plan: MAC and Bier Block and Bier Block-LIDOCAINE ONLY   Post-op Pain Management:    Induction: Intravenous  PONV Risk Score and Plan: 2 and Treatment may vary due to age or medical condition, Propofol infusion and Ondansetron  Airway Management Planned: Natural Airway and Simple Face Mask  Additional Equipment:   Intra-op Plan:   Post-operative Plan:   Informed Consent: I have reviewed the patients History and Physical, chart, labs and discussed the procedure including the risks, benefits and alternatives for the proposed anesthesia with the patient or authorized representative who has indicated his/her understanding and acceptance.     Dental advisory given  Plan Discussed with: Anesthesiologist  and CRNA  Anesthesia Plan Comments:         Anesthesia Quick Evaluation

## 2021-01-10 NOTE — Transfer of Care (Signed)
Immediate Anesthesia Transfer of Care Note  Patient: Chase Malone  Procedure(s) Performed: RELEASE TRIGGER FINGER/A-1 PULLEY LEFT INDEX TRIGGER RLEASE AND INCISION ANNULAR LIGAMENT CYST (Left: Hand)  Patient Location: PACU  Anesthesia Type:MAC and Bier block  Level of Consciousness: sedated  Airway & Oxygen Therapy: Patient Spontanous Breathing and Patient connected to face mask oxygen  Post-op Assessment: Report given to RN and Post -op Vital signs reviewed and stable  Post vital signs: Reviewed and stable  Last Vitals:  Vitals Value Taken Time  BP 133/84 01/10/21 0940  Temp    Pulse 85 01/10/21 0942  Resp 15 01/10/21 0942  SpO2 97 % 01/10/21 0942  Vitals shown include unvalidated device data.  Last Pain:  Vitals:   01/10/21 0716  TempSrc: Oral  PainSc: 7       Patients Stated Pain Goal: 4 (16/10/96 0454)  Complications: No notable events documented.

## 2021-01-10 NOTE — Op Note (Addendum)
01/10/2021 [facility]  Operative Note  PREOPERATIVE DIAGNOSIS: LEFT INDEX TRIGGER DIGIT AND ANNULAR LIGAMENT CYST  POSTOPERATIVE DIAGNOSIS:  LEFT INDEX TRIGGER DIGIT AND ANNULAR LIGAMENT CYST  PROCEDURE: Procedure(s): RELEASE TRIGGER FINGER/A-1 PULLEY LEFT INDEX TRIGGER RLEASE AND EXCISION ANNULAR LIGAMENT CYST left index finger  SURGEON:  Betha Loa, MD  ASSISTANT:  none.  ANESTHESIA:  Bier block with sedation.  IV FLUIDS:  Per anesthesia flow sheet.  ESTIMATED BLOOD LOSS:  Minimal.  COMPLICATIONS:  None.  SPECIMENS:  None.  TOURNIQUET TIME:  Total Tourniquet Time Documented: Forearm (Left) - 23 minutes Total: Forearm (Left) - 23 minutes   DISPOSITION:  Stable to PACU.  LOCATION: [facility]  INDICATIONS: Chase Malone is a 75 y.o. male with triggering left index finger and palpable annular ligament cyst.  He has had this injected without lasting resolution.  Wishes to have left index finger trigger release and excision of annular ligament cyst.  Risks, benefits and alternatives of surgery were discussed including the risk of blood loss, infection, damage to nerves, vessels, tendons, ligaments, bone, failure of surgery, need for additional surgery, complications with wound healing, continued pain, continued triggering and need for repeat surgery.  He voiced understanding of these risks and elected to proceed.  OPERATIVE COURSE:  After being identified preoperatively by myself, the patient and I agreed upon the procedure and site of procedure.  The surgical site was marked. Surgical consent had been signed. He was given IV Ancef as preoperative antibiotic prophylaxis. He was transported to the operating room and placed on the operating room table in supine position with the Left upper extremity on an arm board. Bier block anesthesia was induced by the anesthesiologist.  The Left upper extremity was prepped and draped in normal sterile orthopedic  fashion. A surgical pause was performed between surgeons, anesthesia, and operating room staff, and all were in agreement as to the patient, procedure, and site of procedure.  Tourniquet at the proximal aspect of the forearm had been inflated for the Bier block.  An incision was made at the volar aspect of the MP joint of the index finger.  This was carried into the subcutaneous tissues by preading technique.  Bipolar electrocautery was used to obtain hemostasis.  The radial and ulnar digital nerves were protected throughout the case. The flexor sheath was identified.  Cyst was noted coming off from the sheath at the proximal aspect of the A2 pulley.  The A1 pulley was identified and sharply incised.  It was released in its entirety.  The cyst was excised including surrounding edge of the A2 pulley including the proximal border.  It was sent to pathology for examination.  The finger was placed through a range of motion and there was noted to be no catching.  The tendons were brought through the wound and any adherences released.  The wound was then copiously irrigated with sterile saline. It was closed with 4-0 nylon in a horizontal mattress fashion.  It was injected with 0.25% plain Marcaine to aid in postoperative analgesia.  It was dressed with sterile Xeroform, 4x4s, and wrapped lightly with a Coban dressing.  Tourniquet was deflated at 23 minutes.  The fingertips were pink with brisk capillary refill after deflation of the tourniquet.  The operative drapes were broken down and the patient was awoken from anesthesia safely.  He was transferred back to the stretcher and taken to the PACU in stable condition.   I will see him back  in the office in 1 week for postoperative followup.  I will give him a prescription for Norco 5/325 1-2 tabs PO q6 hours prn pain, dispense #15.    Betha Loa, MD Electronically signed, 01/10/21

## 2021-01-11 ENCOUNTER — Encounter (HOSPITAL_BASED_OUTPATIENT_CLINIC_OR_DEPARTMENT_OTHER): Payer: Self-pay | Admitting: Orthopedic Surgery

## 2021-01-11 LAB — SURGICAL PATHOLOGY

## 2021-04-11 ENCOUNTER — Ambulatory Visit: Payer: Medicare Other | Admitting: Urology

## 2021-04-11 ENCOUNTER — Encounter: Payer: Self-pay | Admitting: Urology

## 2021-04-11 ENCOUNTER — Other Ambulatory Visit: Payer: Self-pay

## 2021-04-11 ENCOUNTER — Ambulatory Visit (HOSPITAL_COMMUNITY)
Admission: RE | Admit: 2021-04-11 | Discharge: 2021-04-11 | Disposition: A | Discharge status: Home / Self Care | Payer: Medicare Other | Source: Ambulatory Visit | Attending: Urology | Admitting: Urology

## 2021-04-11 VITALS — BP 165/92 | HR 88 | Temp 97.9°F | Ht 66.0 in | Wt 182.0 lb

## 2021-04-11 DIAGNOSIS — N261 Atrophy of kidney (terminal): Secondary | ICD-10-CM

## 2021-04-11 DIAGNOSIS — N211 Calculus in urethra: Secondary | ICD-10-CM

## 2021-04-11 DIAGNOSIS — R3915 Urgency of urination: Secondary | ICD-10-CM

## 2021-04-11 LAB — URINALYSIS, ROUTINE W REFLEX MICROSCOPIC
Bilirubin, UA: NEGATIVE
Ketones, UA: NEGATIVE
Leukocytes,UA: NEGATIVE
Nitrite, UA: NEGATIVE
Protein,UA: NEGATIVE
RBC, UA: NEGATIVE
Specific Gravity, UA: 1.015 (ref 1.005–1.030)
Urobilinogen, Ur: 0.2 mg/dL (ref 0.2–1.0)
pH, UA: 5.5 (ref 5.0–7.5)

## 2021-04-11 NOTE — Progress Notes (Signed)
Urological Symptom Review  Patient is experiencing the following symptoms: Frequent urination Hard to postpone urination Burning/pain with urination Get up at night to urinate Leakage of urine Stream starts and stops Trouble starting stream Blood in urine Urinary tract infection Injury to kidneys/bladder Weak stream Erection problems (male only) Penile pain (male only)    Review of Systems  Gastrointestinal (upper)  : Indigestion/heartburn  Gastrointestinal (lower) : Negative for lower GI symptoms  Constitutional : Fatigue  Skin: Itching  Eyes: Blurred vision Double vision  Ear/Nose/Throat : Negative for Ear/Nose/Throat symptoms  Hematologic/Lymphatic: Easy bruising  Cardiovascular : Negative for cardiovascular symptoms  Respiratory : Negative for respiratory symptoms  Endocrine: Negative for endocrine symptoms  Musculoskeletal: Back pain Joint pain  Neurological: Headaches Dizziness  Psychologic: Anxiety

## 2021-04-11 NOTE — Progress Notes (Signed)
Subjective: 1. Urethral stone   2. Urgency of urination   3. Atrophic kidney, acquired      Consult requested by Chase Malone.   Chase Malone is a 75 yo male who has previously been seen by Chase Malone and Chase Malone.  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  He has CKD3 since the procedure but it has been stable and the Cr was 1.67 on 01/08/21.  He had prior ESWL for stones.  He returns now with increased voiding symptoms with hesitancy and a dribbling stream.  He has some perineal pain when he sits.   He had a CT in 8/22 for the symptoms and microhematuria and has a 8mm stone in the proximal urethra or prostate that was not noted in the report.   ROS:  ROS  No Known Allergies  Past Medical History:  Diagnosis Date   Anxiety    Arthritis    CKD (chronic kidney disease) stage 3, GFR 30-59 ml/min (HCC)    Depression    GERD (gastroesophageal reflux disease)    History of kidney stones    History of pneumonia 2013   Migraine    Type 2 diabetes mellitus (HCC)     Past Surgical History:  Procedure Laterality Date   bilateral cataract surgery      bilateral trigger fingers      2 fingers on each hand    CHOLECYSTECTOMY     left hand carpal tunnel      left knee surgery      right knee surgery      x 3   ROBOT ASSITED LAPAROSCOPIC NEPHROURETERECTOMY Right 06/20/2016   Procedure: XI ROBOT ASSITED LAPAROSCOPIC NEPHROURETERECTOMY extensive adhesiolysis;  Surgeon: Chase Manny, Chase Malone;  Location: [facility];  Service: Urology;  Laterality: Right;   TRIGGER FINGER RELEASE Left 01/10/2021   Procedure: RELEASE TRIGGER FINGER/A-1 PULLEY LEFT INDEX TRIGGER RLEASE AND INCISION ANNULAR LIGAMENT CYST;  Surgeon: Chase Malone, Kevin, Chase Malone;  Location: [facility];  Service: Orthopedics;  Laterality: Left;  Bier block   VASECTOMY      Social History    Socioeconomic History   Marital status: Married    Spouse name: Not on file   Number of children: Not on file   Years of education: Not on file   Highest education level: Not on file  Occupational History   Not on file  Tobacco Use   Smoking status: Former    Types: Cigarettes    Quit date: 07/07/1973    Years since quitting: 47.7   Smokeless tobacco: Never  Substance and Sexual Activity   Alcohol use: No   Drug use: No   Sexual activity: Not on file  Other Topics Concern   Not on file  Social History Narrative   Not on file   Social Determinants of Health   Financial Resource Strain: Not on file  Food Insecurity: Not on file  Transportation Needs: Not on file  Physical Activity: Not on file  Stress: Not on file  Social Connections: Not on file  Intimate Partner Violence: Not on file    Family History  Problem Relation Age of Onset   Seizures Mother    Lung cancer Mother    Heart disease Father    Stroke Father     Anti-infectives: Anti-infectives (From admission, onward)      None       Current Outpatient Medications  Medication Sig Dispense Refill   amitriptyline (ELAVIL) 10 MG tablet Take 10 mg by mouth at bedtime.     aspirin EC 81 MG tablet Take 81 mg by mouth daily.     cetirizine (ZYRTEC) 10 MG tablet Take 10 mg by mouth daily.     Cholecalciferol (VITAMIN D3) 50 MCG (2000 UT) TABS Take 1 tablet by mouth daily.     clonazePAM (KLONOPIN) 1 MG tablet Take 1 mg by mouth at bedtime.     FLUoxetine (PROZAC) 20 MG capsule Take 20 mg by mouth daily.     metFORMIN (GLUCOPHAGE-XR) 500 MG 24 hr tablet Take 1,000 mg by mouth every morning.     pantoprazole (PROTONIX) 40 MG tablet Take 80 mg by mouth every morning.      pregabalin (LYRICA) 75 MG capsule Take 75-150 mg by mouth 2 (two) times daily. 75 mg in the morning and 150 mg in the evening     simvastatin (ZOCOR) 20 MG tablet Take 20 mg by mouth daily.     SUMAtriptan (IMITREX) 100 MG tablet Take 100 mg by  mouth daily as needed for migraine. May repeat in 2 hours if headache persists or recurs.     timolol (BETIMOL) 0.5 % ophthalmic solution Place 1 drop into both eyes daily.     No current facility-administered medications for this visit.     Objective: Vital signs in last 24 hours: BP (!) 165/92   Pulse 88   Temp 97.9 F (36.6 C)   Ht 5' 6" (1.676 m)   Wt 182 lb (82.6 kg)   BMI 29.38 kg/m   Intake/Output from previous day: No intake/output data recorded. Intake/Output this shift: @IOTHISSHIFT@   Physical Exam Vitals reviewed.  Constitutional:      Appearance: Normal appearance. He is obese.  Cardiovascular:     Rate and Rhythm: Normal rate and regular rhythm.     Heart sounds: Normal heart sounds.  Pulmonary:     Effort: Pulmonary effort is normal. No respiratory distress.     Breath sounds: Normal breath sounds.  Abdominal:     General: Abdomen is flat.     Palpations: Abdomen is soft.     Tenderness: There is no abdominal tenderness.     Hernia: No hernia is present.  Genitourinary:    Comments: Uncirc phallus with adequate meatus.  Scrotum, testes and epididymis normal. AP without lesions,  NST without mass. Prostate 1.5+ without nodules.  He has some tenderness.  SV non-palpable.  Neurological:     Mental Status: He is alert.    Lab Results:  Results for orders placed or performed in visit on 04/11/21 (from the past 24 hour(s))  Urinalysis, Routine w reflex microscopic     Status: Abnormal   Collection Time: 04/11/21 11:48 AM  Result Value Ref Range   Specific Gravity, UA 1.015 1.005 - 1.030   pH, UA 5.5 5.0 - 7.5   Color, UA Yellow Yellow   Appearance Ur Clear Clear   Leukocytes,UA Negative Negative   Protein,UA Negative Negative/Trace   Glucose, UA 2+ (A) Negative   Ketones, UA Negative Negative   RBC, UA Negative Negative   Bilirubin, UA Negative Negative   Urobilinogen, Ur 0.2 0.2 - 1.0 mg/dL   Nitrite, UA Negative Negative   Microscopic  Examination Comment    Narrative   Performed at:  01 - [facility] [address], [place],   [place]  [id number] Lab Director: Chinita Pester MT, Phone:  [phone]    BMET No results for input(s): NA, K, CL, CO2, GLUCOSE, BUN, CREATININE, CALCIUM in the last 72 hours. PT/INR No results for input(s): LABPROT, INR in the last 72 hours. ABG No results for input(s): PHART, HCO3 in the last 72 hours.  Invalid input(s): PCO2, PO2  Studies/Results: No results found.   Assessment/Plan: Urethral stone.  He has an 29mm stone in the bulbar urethra with an associated history of hematuria and OAB symptoms.   I will get a KUB to assess the current location but he will need cystoscopy with possible cystolithalopaxy for removal. I reviewed the risks of bleeding, infection, urethral injury, strictures, thrombotic events and anesthetic complications.  He would like to be done in [place] so I will have him set up with Chase Malone.    No orders of the defined types were placed in this encounter.    Orders Placed This Encounter  Procedures   DG Abd 1 View    Please include the to below the pubis on the image.    Order Specific Question:   Reason for Exam (SYMPTOM  OR DIAGNOSIS REQUIRED)    Answer:   Urethral stone.    Order Specific Question:   Preferred imaging location?    Answer:   [facility]    Order Specific Question:   Radiology Contrast Protocol - do NOT remove file path    Answer:   [email or web address]   Urinalysis, Routine w reflex microscopic     Return for Needs surgery on 10/17 and then f/u in 2-3 weeks..    CC: Dr. Fara Chute.     Chase Malone 04/11/2021 [phone]

## 2021-04-11 NOTE — H&P (View-Only) (Signed)
Subjective: 1. Urethral stone   2. Urgency of urination   3. Atrophic kidney, acquired      Consult requested by Dr. Fara Chute.   Mr. Bradsher is a 75 yo male who has previously been seen by Dr. Nechama Guard and Dr. Berneice Heinrich.  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  He has CKD3 since the procedure but it has been stable and the Cr was 1.67 on 01/08/21.  He had prior ESWL for stones.  He returns now with increased voiding symptoms with hesitancy and a dribbling stream.  He has some perineal pain when he sits.   He had a CT in 8/22 for the symptoms and microhematuria and has a 61mm stone in the proximal urethra or prostate that was not noted in the report.   ROS:  ROS  No Known Allergies  Past Medical History:  Diagnosis Date   Anxiety    Arthritis    CKD (chronic kidney disease) stage 3, GFR 30-59 ml/min (HCC)    Depression    GERD (gastroesophageal reflux disease)    History of kidney stones    History of pneumonia 2013   Migraine    Type 2 diabetes mellitus (HCC)     Past Surgical History:  Procedure Laterality Date   bilateral cataract surgery      bilateral trigger fingers      2 fingers on each hand    CHOLECYSTECTOMY     left hand carpal tunnel      left knee surgery      right knee surgery      x 3   ROBOT ASSITED LAPAROSCOPIC NEPHROURETERECTOMY Right 06/20/2016   Procedure: XI ROBOT ASSITED LAPAROSCOPIC NEPHROURETERECTOMY extensive adhesiolysis;  Surgeon: Sebastian Ache, MD;  Location: [facility];  Service: Urology;  Laterality: Right;   TRIGGER FINGER RELEASE Left 01/10/2021   Procedure: RELEASE TRIGGER FINGER/A-1 PULLEY LEFT INDEX TRIGGER RLEASE AND INCISION ANNULAR LIGAMENT CYST;  Surgeon: Betha Loa, MD;  Location: [facility];  Service: Orthopedics;  Laterality: Left;  Bier block   VASECTOMY      Social History    Socioeconomic History   Marital status: Married    Spouse name: Not on file   Number of children: Not on file   Years of education: Not on file   Highest education level: Not on file  Occupational History   Not on file  Tobacco Use   Smoking status: Former    Types: Cigarettes    Quit date: 07/07/1973    Years since quitting: 47.7   Smokeless tobacco: Never  Substance and Sexual Activity   Alcohol use: No   Drug use: No   Sexual activity: Not on file  Other Topics Concern   Not on file  Social History Narrative   Not on file   Social Determinants of Health   Financial Resource Strain: Not on file  Food Insecurity: Not on file  Transportation Needs: Not on file  Physical Activity: Not on file  Stress: Not on file  Social Connections: Not on file  Intimate Partner Violence: Not on file    Family History  Problem Relation Age of Onset   Seizures Mother    Lung cancer Mother    Heart disease Father    Stroke Father     Anti-infectives: Anti-infectives (From admission, onward)  None       Current Outpatient Medications  Medication Sig Dispense Refill   amitriptyline (ELAVIL) 10 MG tablet Take 10 mg by mouth at bedtime.     aspirin EC 81 MG tablet Take 81 mg by mouth daily.     cetirizine (ZYRTEC) 10 MG tablet Take 10 mg by mouth daily.     Cholecalciferol (VITAMIN D3) 50 MCG (2000 UT) TABS Take 1 tablet by mouth daily.     clonazePAM (KLONOPIN) 1 MG tablet Take 1 mg by mouth at bedtime.     FLUoxetine (PROZAC) 20 MG capsule Take 20 mg by mouth daily.     metFORMIN (GLUCOPHAGE-XR) 500 MG 24 hr tablet Take 1,000 mg by mouth every morning.     pantoprazole (PROTONIX) 40 MG tablet Take 80 mg by mouth every morning.      pregabalin (LYRICA) 75 MG capsule Take 75-150 mg by mouth 2 (two) times daily. 75 mg in the morning and 150 mg in the evening     simvastatin (ZOCOR) 20 MG tablet Take 20 mg by mouth daily.     SUMAtriptan (IMITREX) 100 MG tablet Take 100 mg by  mouth daily as needed for migraine. May repeat in 2 hours if headache persists or recurs.     timolol (BETIMOL) 0.5 % ophthalmic solution Place 1 drop into both eyes daily.     No current facility-administered medications for this visit.     Objective: Vital signs in last 24 hours: BP (!) 165/92   Pulse 88   Temp 97.9 F (36.6 C)   Ht 5\' 6"  (1.676 m)   Wt 182 lb (82.6 kg)   BMI 29.38 kg/m   Intake/Output from previous day: No intake/output data recorded. Intake/Output this shift: @IOTHISSHIFT @   Physical Exam Vitals reviewed.  Constitutional:      Appearance: Normal appearance. He is obese.  Cardiovascular:     Rate and Rhythm: Normal rate and regular rhythm.     Heart sounds: Normal heart sounds.  Pulmonary:     Effort: Pulmonary effort is normal. No respiratory distress.     Breath sounds: Normal breath sounds.  Abdominal:     General: Abdomen is flat.     Palpations: Abdomen is soft.     Tenderness: There is no abdominal tenderness.     Hernia: No hernia is present.  Genitourinary:    Comments: Uncirc phallus with adequate meatus.  Scrotum, testes and epididymis normal. AP without lesions,  NST without mass. Prostate 1.5+ without nodules.  He has some tenderness.  SV non-palpable.  Neurological:     Mental Status: He is alert.    Lab Results:  Results for orders placed or performed in visit on 04/11/21 (from the past 24 hour(s))  Urinalysis, Routine w reflex microscopic     Status: Abnormal   Collection Time: 04/11/21 11:48 AM  Result Value Ref Range   Specific Gravity, UA 1.015 1.005 - 1.030   pH, UA 5.5 5.0 - 7.5   Color, UA Yellow Yellow   Appearance Ur Clear Clear   Leukocytes,UA Negative Negative   Protein,UA Negative Negative/Trace   Glucose, UA 2+ (A) Negative   Ketones, UA Negative Negative   RBC, UA Negative Negative   Bilirubin, UA Negative Negative   Urobilinogen, Ur 0.2 0.2 - 1.0 mg/dL   Nitrite, UA Negative Negative   Microscopic  Examination Comment    Narrative   Performed at:  01 - [facility] [address] [place],  [place]  [id number] Lab Director: Chinita Pester MT, Phone:  [phone]    BMET No results for input(s): NA, K, CL, CO2, GLUCOSE, BUN, CREATININE, CALCIUM in the last 72 hours. PT/INR No results for input(s): LABPROT, INR in the last 72 hours. ABG No results for input(s): PHART, HCO3 in the last 72 hours.  Invalid input(s): PCO2, PO2  Studies/Results: No results found.   Assessment/Plan: Urethral stone.  He has an 29mm stone in the bulbar urethra with an associated history of hematuria and OAB symptoms.   I will get a KUB to assess the current location but he will need cystoscopy with possible cystolithalopaxy for removal. I reviewed the risks of bleeding, infection, urethral injury, strictures, thrombotic events and anesthetic complications.  He would like to be done in [place] so I will have him set up with Dr. Ronne Binning.    No orders of the defined types were placed in this encounter.    Orders Placed This Encounter  Procedures   DG Abd 1 View    Please include the to below the pubis on the image.    Order Specific Question:   Reason for Exam (SYMPTOM  OR DIAGNOSIS REQUIRED)    Answer:   Urethral stone.    Order Specific Question:   Preferred imaging location?    Answer:   [facility]    Order Specific Question:   Radiology Contrast Protocol - do NOT remove file path    Answer:   [email or web address]   Urinalysis, Routine w reflex microscopic     Return for Needs surgery on 10/17 and then f/u in 2-3 weeks..    CC: Dr. Fara Chute.     Bjorn Pippin 04/11/2021 [phone]

## 2021-04-16 NOTE — Progress Notes (Signed)
Results sent via my chart 

## 2021-04-16 NOTE — Patient Instructions (Signed)
Chase Malone  04/16/2021     @PREFPERIOPPHARMACY @   Your procedure is scheduled on 04/22/2021.   Report to [facility] at  0900 A.M.   Call this number if you have problems the morning of surgery:  [phone]   Remember:  Do not eat or drink after midnight.    Take these medicines the morning of surgery with A SIP OF WATER         prozac, protonix, lyrica, imitrex(if needed).     Do not wear jewelry, make-up or nail polish.  Do not wear lotions, powders, or perfumes, or deodorant.  Do not shave 48 hours prior to surgery.  Men may shave face and neck.  Do not bring valuables to the hospital.  [facility] is not responsible for any belongings or valuables.  Contacts, dentures or bridgework may not be worn into surgery.  Leave your suitcase in the car.  After surgery it may be brought to your room.  For patients admitted to the hospital, discharge time will be determined by your treatment team.  Patients discharged the day of surgery will not be allowed to drive home and must have someone with them for 24 hours.    Special instructions:   DO NOT smoke tobacco or vape for 24 hours before your procedure.  Please read over the following fact sheets that you were given. Coughing and Deep Breathing, Surgical Site Infection Prevention, Anesthesia Post-op Instructions, and Care and Recovery After Surgery      Laser Therapy for Kidney Stones, Care After This sheet gives you information about how to care for yourself after your procedure. Your health care provider may also give you more specific instructions. If you have problems or questions, contact your health care provider. What can I expect after the procedure? After the procedure, it is common to have: Pain. A burning sensation while urinating. Small amounts of blood in your urine. A need to urinate frequently. Pieces of kidney stone in your urine. Mild discomfort when urinating that may be felt in the  back. You may experience this if you have a flexible tube (stent) in your ureter. Follow these instructions at home: Medicines Take over-the-counter and prescription medicines only as told by your health care provider. If you were prescribed an antibiotic medicine, take it as told by your health care provider. Do not stop taking the antibiotic even if you start to feel better. Ask your health care provider if the medicine prescribed to you: Requires you to avoid driving or using heavy machinery. Can cause constipation. You may need to take actions to prevent or treat constipation, such as: Take over-the-counter or prescription medicines. Eat foods that are high in fiber, such as beans, whole grains, and fresh fruits and vegetables. Limit foods that are high in fat and processed sugars, such as fried or sweet foods. Activity Return to your normal activities as told by your health care provider. Ask your health care provider what activities are safe for you. Do not drive for 24 hours if you were given a sedative during your procedure. General instructions If your health care provider approves, you may take a warm bath to ease discomfort and burning. Drink enough fluid to keep your urine pale yellow. Your health care provider may recommend drinking two 8 oz (237 mL) glasses of water per hour for a few hours after your procedure. You may be asked to strain your urine to collect any  stone fragments that you pass. These fragments may be tested. Keep all follow-up visits as told by your health care provider. This is important. If you have a stent, you will need to return to your health care provider to have the stent removed. Contact a health care provider if you: Have pain or a burning feeling that lasts more than 2 days. Feel nauseous. Vomit more and more often. Have difficulty urinating. Have pain that gets worse or does not get better with medicine. Get help right away if: You are unable to  urinate, even if your bladder feels full. You have: Bright red blood or blood clots in your urine. More blood in your urine. Severe pain or discomfort. A fever or shaking chills. Abdominal pain. Difficulty breathing. Swelling in your legs. Summary After the procedure, it is common to have a burning sensation while urinating and small amounts of blood in your urine. Take over-the-counter and prescription medicines only as told by your health care provider. Drink enough fluid to keep your urine pale yellow. Keep all follow-up visits as told by your health care provider. This is important. This information is not intended to replace advice given to you by your health care provider. Make sure you discuss any questions you have with your health care provider. Document Revised: 03/04/2018 Document Reviewed: 03/04/2018 Elsevier Patient Education  2022 [employer] General Anesthesia, Adult, Care After This sheet gives you information about how to care for yourself after your procedure. Your health care provider may also give you more specific instructions. If you have problems or questions, contact your health care provider. What can I expect after the procedure? After the procedure, the following side effects are common: Pain or discomfort at the IV site. Nausea. Vomiting. Sore throat. Trouble concentrating. Feeling cold or chills. Feeling weak or tired. Sleepiness and fatigue. Soreness and body aches. These side effects can affect parts of the body that were not involved in surgery. Follow these instructions at home: For the time period you were told by your health care provider:  Rest. Do not participate in activities where you could fall or become injured. Do not drive or use machinery. Do not drink alcohol. Do not take sleeping pills or medicines that cause drowsiness. Do not make important decisions or sign legal documents. Do not take care of children on your own. Eating and  drinking Follow any instructions from your health care provider about eating or drinking restrictions. When you feel hungry, start by eating small amounts of foods that are soft and easy to digest (bland), such as toast. Gradually return to your regular diet. Drink enough fluid to keep your urine pale yellow. If you vomit, rehydrate by drinking water, juice, or clear broth. General instructions If you have sleep apnea, surgery and certain medicines can increase your risk for breathing problems. Follow instructions from your health care provider about wearing your sleep device: Anytime you are sleeping, including during daytime naps. While taking prescription pain medicines, sleeping medicines, or medicines that make you drowsy. Have a responsible adult stay with you for the time you are told. It is important to have someone help care for you until you are awake and alert. Return to your normal activities as told by your health care provider. Ask your health care provider what activities are safe for you. Take over-the-counter and prescription medicines only as told by your health care provider. If you smoke, do not smoke without supervision. Keep all follow-up visits as told by  your health care provider. This is important. Contact a health care provider if: You have nausea or vomiting that does not get better with medicine. You cannot eat or drink without vomiting. You have pain that does not get better with medicine. You are unable to pass urine. You develop a skin rash. You have a fever. You have redness around your IV site that gets worse. Get help right away if: You have difficulty breathing. You have chest pain. You have blood in your urine or stool, or you vomit blood. Summary After the procedure, it is common to have a sore throat or nausea. It is also common to feel tired. Have a responsible adult stay with you for the time you are told. It is important to have someone help care  for you until you are awake and alert. When you feel hungry, start by eating small amounts of foods that are soft and easy to digest (bland), such as toast. Gradually return to your regular diet. Drink enough fluid to keep your urine pale yellow. Return to your normal activities as told by your health care provider. Ask your health care provider what activities are safe for you. This information is not intended to replace advice given to you by your health care provider. Make sure you discuss any questions you have with your health care provider. Document Revised: 03/08/2020 Document Reviewed: 10/06/2019 Elsevier Patient Education  2022 Else[employer] Inc. How to Use Chlorhexidine for Bathing Chlorhexidine gluconate (CHG) is a germ-killing (antiseptic) solution that is used to clean the skin. It can get rid of the bacteria that normally live on the skin and can keep them away for about 24 hours. To clean your skin with CHG, you may be given: A CHG solution to use in the shower or as part of a sponge bath. A prepackaged cloth that contains CHG. Cleaning your skin with CHG may help lower the risk for infection: While you are staying in the intensive care unit of the hospital. If you have a vascular access, such as a central line, to provide short-term or long-term access to your veins. If you have a catheter to drain urine from your bladder. If you are on a ventilator. A ventilator is a machine that helps you breathe by moving air in and out of your lungs. After surgery. What are the risks? Risks of using CHG include: A skin reaction. Hearing loss, if CHG gets in your ears and you have a perforated eardrum. Eye injury, if CHG gets in your eyes and is not rinsed out. The CHG product catching fire. Make sure that you avoid smoking and flames after applying CHG to your skin. Do not use CHG: If you have a chlorhexidine allergy or have previously reacted to chlorhexidine. On babies younger than 49  months of age. How to use CHG solution Use CHG only as told by your health care provider, and follow the instructions on the label. Use the full amount of CHG as directed. Usually, this is one bottle. During a shower Follow these steps when using CHG solution during a shower (unless your health care provider gives you different instructions): Start the shower. Use your normal soap and shampoo to wash your face and hair. Turn off the shower or move out of the shower stream. Pour the CHG onto a clean washcloth. Do not use any type of brush or rough-edged sponge. Starting at your neck, lather your body down to your toes. Make sure you follow these instructions:  If you will be having surgery, pay special attention to the part of your body where you will be having surgery. Scrub this area for at least 1 minute. Do not use CHG on your head or face. If the solution gets into your ears or eyes, rinse them well with water. Avoid your genital area. Avoid any areas of skin that have broken skin, cuts, or scrapes. Scrub your back and under your arms. Make sure to wash skin folds. Let the lather sit on your skin for 1-2 minutes or as long as told by your health care provider. Thoroughly rinse your entire body in the shower. Make sure that all body creases and crevices are rinsed well. Dry off with a clean towel. Do not put any substances on your body afterward--such as powder, lotion, or perfume--unless you are told to do so by your health care provider. Only use lotions that are recommended by the manufacturer. Put on clean clothes or pajamas. If it is the night before your surgery, sleep in clean sheets.  During a sponge bath Follow these steps when using CHG solution during a sponge bath (unless your health care provider gives you different instructions): Use your normal soap and shampoo to wash your face and hair. Pour the CHG onto a clean washcloth. Starting at your neck, lather your body down to  your toes. Make sure you follow these instructions: If you will be having surgery, pay special attention to the part of your body where you will be having surgery. Scrub this area for at least 1 minute. Do not use CHG on your head or face. If the solution gets into your ears or eyes, rinse them well with water. Avoid your genital area. Avoid any areas of skin that have broken skin, cuts, or scrapes. Scrub your back and under your arms. Make sure to wash skin folds. Let the lather sit on your skin for 1-2 minutes or as long as told by your health care provider. Using a different clean, wet washcloth, thoroughly rinse your entire body. Make sure that all body creases and crevices are rinsed well. Dry off with a clean towel. Do not put any substances on your body afterward--such as powder, lotion, or perfume--unless you are told to do so by your health care provider. Only use lotions that are recommended by the manufacturer. Put on clean clothes or pajamas. If it is the night before your surgery, sleep in clean sheets. How to use CHG prepackaged cloths Only use CHG cloths as told by your health care provider, and follow the instructions on the label. Use the CHG cloth on clean, dry skin. Do not use the CHG cloth on your head or face unless your health care provider tells you to. When washing with the CHG cloth: Avoid your genital area. Avoid any areas of skin that have broken skin, cuts, or scrapes. Before surgery Follow these steps when using a CHG cloth to clean before surgery (unless your health care provider gives you different instructions): Using the CHG cloth, vigorously scrub the part of your body where you will be having surgery. Scrub using a back-and-forth motion for 3 minutes. The area on your body should be completely wet with CHG when you are done scrubbing. Do not rinse. Discard the cloth and let the area air-dry. Do not put any substances on the area afterward, such as powder,  lotion, or perfume. Put on clean clothes or pajamas. If it is the night before your surgery, sleep  in clean sheets.  For general bathing Follow these steps when using CHG cloths for general bathing (unless your health care provider gives you different instructions). Use a separate CHG cloth for each area of your body. Make sure you wash between any folds of skin and between your fingers and toes. Wash your body in the following order, switching to a new cloth after each step: The front of your neck, shoulders, and chest. Both of your arms, under your arms, and your hands. Your stomach and groin area, avoiding the genitals. Your right leg and foot. Your left leg and foot. The back of your neck, your back, and your buttocks. Do not rinse. Discard the cloth and let the area air-dry. Do not put any substances on your body afterward--such as powder, lotion, or perfume--unless you are told to do so by your health care provider. Only use lotions that are recommended by the manufacturer. Put on clean clothes or pajamas. Contact a health care provider if: Your skin gets irritated after scrubbing. You have questions about using your solution or cloth. You swallow any chlorhexidine. Call your local poison control center ([phone] in the [place] Get help right away if: Your eyes itch badly, or they become very red or swollen. Your skin itches badly and is red or swollen. Your hearing changes. You have trouble seeing. You have swelling or tingling in your mouth or throat. You have trouble breathing. These symptoms may represent a serious problem that is an emergency. Do not wait to see if the symptoms will go away. Get medical help right away. Call your local emergency services (911 in the [place] Do not drive yourself to the hospital. Summary Chlorhexidine gluconate (CHG) is a germ-killing (antiseptic) solution that is used to clean the skin. Cleaning your skin with CHG may help to lower your  risk for infection. You may be given CHG to use for bathing. It may be in a bottle or in a prepackaged cloth to use on your skin. Carefully follow your health care provider's instructions and the instructions on the product label. Do not use CHG if you have a chlorhexidine allergy. Contact your health care provider if your skin gets irritated after scrubbing. This information is not intended to replace advice given to you by your health care provider. Make sure you discuss any questions you have with your health care provider. Document Revised: 09/03/2020 Document Reviewed: 09/03/2020 Elsevier Patient Education  2022 [employer].

## 2021-04-18 ENCOUNTER — Encounter (HOSPITAL_COMMUNITY)
Admission: RE | Admit: 2021-04-18 | Discharge: 2021-04-18 | Disposition: A | Discharge status: Home / Self Care | Payer: Medicare Other | Source: Ambulatory Visit | Attending: Urology | Admitting: Urology

## 2021-04-18 VITALS — BP 128/75 | HR 86 | Temp 97.9°F | Resp 18 | Ht 66.0 in | Wt 182.0 lb

## 2021-04-18 DIAGNOSIS — N261 Atrophy of kidney (terminal): Secondary | ICD-10-CM | POA: Diagnosis not present

## 2021-04-18 DIAGNOSIS — Z01812 Encounter for preprocedural laboratory examination: Secondary | ICD-10-CM | POA: Diagnosis not present

## 2021-04-18 LAB — HEMOGLOBIN A1C
Hgb A1c MFr Bld: 8.5 % — ABNORMAL HIGH (ref 4.8–5.6)
Mean Plasma Glucose: 197.25 mg/dL

## 2021-04-22 ENCOUNTER — Ambulatory Visit (HOSPITAL_COMMUNITY): Payer: Medicare Other | Admitting: Anesthesiology

## 2021-04-22 ENCOUNTER — Other Ambulatory Visit: Payer: Self-pay

## 2021-04-22 ENCOUNTER — Ambulatory Visit (HOSPITAL_COMMUNITY)
Admission: RE | Admit: 2021-04-22 | Discharge: 2021-04-22 | Disposition: A | Discharge status: Home / Self Care | Payer: Medicare Other | Source: Ambulatory Visit | Attending: Urology | Admitting: Urology

## 2021-04-22 ENCOUNTER — Encounter (HOSPITAL_COMMUNITY): Payer: Self-pay | Admitting: Urology

## 2021-04-22 ENCOUNTER — Encounter (HOSPITAL_COMMUNITY)
Admission: RE | Disposition: A | Discharge status: Home / Self Care | Payer: Self-pay | Source: Ambulatory Visit | Attending: Urology

## 2021-04-22 DIAGNOSIS — N261 Atrophy of kidney (terminal): Secondary | ICD-10-CM | POA: Insufficient documentation

## 2021-04-22 DIAGNOSIS — Z7982 Long term (current) use of aspirin: Secondary | ICD-10-CM | POA: Insufficient documentation

## 2021-04-22 DIAGNOSIS — Z79899 Other long term (current) drug therapy: Secondary | ICD-10-CM | POA: Insufficient documentation

## 2021-04-22 DIAGNOSIS — Z906 Acquired absence of other parts of urinary tract: Secondary | ICD-10-CM | POA: Diagnosis not present

## 2021-04-22 DIAGNOSIS — Z87891 Personal history of nicotine dependence: Secondary | ICD-10-CM | POA: Diagnosis not present

## 2021-04-22 DIAGNOSIS — N183 Chronic kidney disease, stage 3 unspecified: Secondary | ICD-10-CM | POA: Diagnosis not present

## 2021-04-22 DIAGNOSIS — E1122 Type 2 diabetes mellitus with diabetic chronic kidney disease: Secondary | ICD-10-CM | POA: Insufficient documentation

## 2021-04-22 DIAGNOSIS — N210 Calculus in bladder: Secondary | ICD-10-CM | POA: Diagnosis not present

## 2021-04-22 DIAGNOSIS — R3915 Urgency of urination: Secondary | ICD-10-CM | POA: Diagnosis not present

## 2021-04-22 DIAGNOSIS — Z87442 Personal history of urinary calculi: Secondary | ICD-10-CM | POA: Diagnosis not present

## 2021-04-22 DIAGNOSIS — N138 Other obstructive and reflux uropathy: Secondary | ICD-10-CM | POA: Diagnosis not present

## 2021-04-22 DIAGNOSIS — N401 Enlarged prostate with lower urinary tract symptoms: Secondary | ICD-10-CM | POA: Insufficient documentation

## 2021-04-22 DIAGNOSIS — Z905 Acquired absence of kidney: Secondary | ICD-10-CM | POA: Diagnosis not present

## 2021-04-22 DIAGNOSIS — Z7984 Long term (current) use of oral hypoglycemic drugs: Secondary | ICD-10-CM | POA: Diagnosis not present

## 2021-04-22 DIAGNOSIS — N211 Calculus in urethra: Secondary | ICD-10-CM | POA: Diagnosis present

## 2021-04-22 HISTORY — PX: HOLMIUM LASER APPLICATION: SHX5852

## 2021-04-22 HISTORY — PX: CYSTOSCOPY WITH LITHOLAPAXY: SHX1425

## 2021-04-22 LAB — GLUCOSE, CAPILLARY: Glucose-Capillary: 214 mg/dL — ABNORMAL HIGH (ref 70–99)

## 2021-04-22 SURGERY — CYSTOSCOPY, WITH BLADDER CALCULUS LITHOLAPAXY
Anesthesia: General | Site: Urethra

## 2021-04-22 MED ORDER — FENTANYL CITRATE (PF) 250 MCG/5ML IJ SOLN
INTRAMUSCULAR | Status: DC | PRN
  Administered 2021-04-22 (×2): 25 ug via INTRAVENOUS

## 2021-04-22 MED ORDER — WATER FOR IRRIGATION, STERILE IR SOLN
Status: DC | PRN
  Administered 2021-04-22: 1000 mL

## 2021-04-22 MED ORDER — DEXAMETHASONE SODIUM PHOSPHATE 10 MG/ML IJ SOLN
INTRAMUSCULAR | Status: AC
  Filled 2021-04-22: qty 1

## 2021-04-22 MED ORDER — PROPOFOL 10 MG/ML IV BOLUS
INTRAVENOUS | Status: AC
  Filled 2021-04-22: qty 40

## 2021-04-22 MED ORDER — ONDANSETRON HCL 4 MG/2ML IJ SOLN
INTRAMUSCULAR | Status: DC | PRN
  Administered 2021-04-22: 4 mg via INTRAVENOUS

## 2021-04-22 MED ORDER — SODIUM CHLORIDE 0.9 % IR SOLN
Status: DC | PRN
  Administered 2021-04-22: 3000 mL

## 2021-04-22 MED ORDER — ORAL CARE MOUTH RINSE: 15.0000 mL | Freq: Once | OROMUCOSAL | Status: AC

## 2021-04-22 MED ORDER — LIDOCAINE HCL (PF) 2 % IJ SOLN
INTRAMUSCULAR | Status: AC
  Filled 2021-04-22: qty 5

## 2021-04-22 MED ORDER — FENTANYL CITRATE (PF) 100 MCG/2ML IJ SOLN
INTRAMUSCULAR | Status: AC
  Filled 2021-04-22: qty 2

## 2021-04-22 MED ORDER — LACTATED RINGERS IV SOLN: INTRAVENOUS | Status: DC

## 2021-04-22 MED ORDER — LIDOCAINE HCL (CARDIAC) PF 100 MG/5ML IV SOSY
PREFILLED_SYRINGE | INTRAVENOUS | Status: DC | PRN
  Administered 2021-04-22: 60 mg via INTRAVENOUS

## 2021-04-22 MED ORDER — FENTANYL CITRATE PF 50 MCG/ML IJ SOSY
25.0000 ug | PREFILLED_SYRINGE | INTRAMUSCULAR | Status: DC | PRN
  Administered 2021-04-22: 50 ug via INTRAVENOUS
  Filled 2021-04-22: qty 1

## 2021-04-22 MED ORDER — CHLORHEXIDINE GLUCONATE 0.12 % MT SOLN
15.0000 mL | Freq: Once | OROMUCOSAL | Status: AC
  Administered 2021-04-22: 15 mL via OROMUCOSAL
  Filled 2021-04-22: qty 15

## 2021-04-22 MED ORDER — PROPOFOL 10 MG/ML IV BOLUS
INTRAVENOUS | Status: DC | PRN
  Administered 2021-04-22: 160 mg via INTRAVENOUS

## 2021-04-22 MED ORDER — ONDANSETRON HCL 4 MG/2ML IJ SOLN: 4.0000 mg | Freq: Once | INTRAMUSCULAR | Status: DC | PRN

## 2021-04-22 MED ORDER — TRAMADOL HCL 50 MG PO TABS: 50.0000 mg | ORAL_TABLET | Freq: Four times a day (QID) | ORAL | 0 refills | Status: AC | PRN

## 2021-04-22 MED ORDER — CEFAZOLIN SODIUM-DEXTROSE 2-4 GM/100ML-% IV SOLN
2.0000 g | INTRAVENOUS | Status: AC
  Administered 2021-04-22: 2 g via INTRAVENOUS
  Filled 2021-04-22: qty 100

## 2021-04-22 SURGICAL SUPPLY — 18 items
BAG DRAIN URO TABLE W/ADPT NS (BAG) ×2 IMPLANT
BAG DRN 8 ADPR NS SKTRN CSTL (BAG) ×1
BAG DRN RND TRDRP ANRFLXCHMBR (UROLOGICAL SUPPLIES) ×1
BAG HAMPER (MISCELLANEOUS) ×2 IMPLANT
BAG URINE DRAIN 2000ML AR STRL (UROLOGICAL SUPPLIES) ×2 IMPLANT
CATH FOLEY 2WAY SLVR  5CC 18FR (CATHETERS) ×2
CATH FOLEY 2WAY SLVR 5CC 18FR (CATHETERS) ×1 IMPLANT
GLOVE SURG LTX SZ6.5 (GLOVE) ×2 IMPLANT
GLOVE SURG POLYISO LF SZ8 (GLOVE) ×2 IMPLANT
GLOVE SURG UNDER POLY LF SZ7 (GLOVE) ×4 IMPLANT
GOWN STRL REUS W/TWL LRG LVL3 (GOWN DISPOSABLE) ×2 IMPLANT
GOWN STRL REUS W/TWL XL LVL3 (GOWN DISPOSABLE) ×2 IMPLANT
IV NS IRRIG 3000ML ARTHROMATIC (IV SOLUTION) ×2 IMPLANT
KIT TURNOVER CYSTO (KITS) ×2 IMPLANT
LASER FIBER DISP 1000U (UROLOGICAL SUPPLIES) ×2 IMPLANT
PACK CYSTO (CUSTOM PROCEDURE TRAY) ×2 IMPLANT
PAD ARMBOARD 7.5X6 YLW CONV (MISCELLANEOUS) ×4 IMPLANT
WATER STERILE IRR 1000ML POUR (IV SOLUTION) ×2 IMPLANT

## 2021-04-22 NOTE — Op Note (Signed)
Preoperative diagnosis: bladder calculus  Postoperative diagnosis: same  Procedure: 1 cystoscopy 2. cystolithalopaxy for a stone less than 2.5cm  Attending: Wilkie Aye  Anesthesia: General  Estimated blood loss: Minimal  Drains: 18 french foley  Specimens: 1. Bladder calculus  Antibiotics: ancef  Findings:  Ureteral orifices in normal anatomic location.  1.0cm urethral calculus manipulated into the bladder prior to fragmenting.   Indications: Patient is a 75 year old male with a history of BPH and urethral calculus.  After discussing treatment options, they decided proceed with cystolithalopaxy.  Procedure in detail: The patient was brought to the operating room and a brief timeout was done to ensure correct patient, correct procedure, correct site.  General anesthesia was administered patient was placed in dorsal lithotomy position.  Their genitalia was then prepped and draped in usual sterile fashion.  A rigid 22 French cystoscope was passed in the urethra and we encountered a stone in the prostatic urethra which was manipulated into the bladder.   Bladder was inspected and we noted no masses or lesions.  the ureteral orifices were in the normal orthotopic locations. Using the 1000nm laser fiber the bladder calculus was fragmented and the fragments were then irrigated from the bladder. The stone fragments were the sent for analysis. The bladder was then drained and this concluded the procedure which was well tolerated by patient.  Complications: None  Condition: Stable, extubated, transferred to PACU  Plan: Patient is to be discharge home. He will followup in 2 weeks for stone analysis discussion

## 2021-04-22 NOTE — Anesthesia Procedure Notes (Signed)
Procedure Name: LMA Insertion Date/Time: 04/22/2021 9:51 AM Performed by: Lorin Glass, CRNA Pre-anesthesia Checklist: Patient identified, Emergency Drugs available, Suction available and Patient being monitored Patient Re-evaluated:Patient Re-evaluated prior to induction Oxygen Delivery Method: Circle system utilized Preoxygenation: Pre-oxygenation with 100% oxygen Induction Type: IV induction LMA: LMA inserted LMA Size: 4.0 Number of attempts: 1 Placement Confirmation: breath sounds checked- equal and bilateral and positive ETCO2 Tube secured with: Tape Dental Injury: Teeth and Oropharynx as per pre-operative assessment

## 2021-04-22 NOTE — Anesthesia Preprocedure Evaluation (Addendum)
Anesthesia Evaluation  Patient identified by MRN, date of birth, ID band Patient awake    Reviewed: Allergy & Precautions, NPO status , Patient's Chart, lab work & pertinent test results  Airway Mallampati: I  TM Distance: >3 FB Neck ROM: Full    Dental  (+) Dental Advisory Given, Missing   Pulmonary former smoker,    Pulmonary exam normal breath sounds clear to auscultation       Cardiovascular Exercise Tolerance: Poor Normal cardiovascular exam Rhythm:Regular Rate:Normal     Neuro/Psych  Headaches, PSYCHIATRIC DISORDERS Anxiety Depression  Neuromuscular disease (weakness in his lower extremities )    GI/Hepatic GERD  Medicated,  Endo/Other  diabetes, Poorly Controlled, Type 2, Oral Hypoglycemic Agents  Renal/GU Renal InsufficiencyRenal disease (nephrectomy, creatinine - 2.08)     Musculoskeletal  (+) Arthritis , Osteoarthritis,    Abdominal   Peds  Hematology   Anesthesia Other Findings   Reproductive/Obstetrics                           Anesthesia Physical Anesthesia Plan  ASA: 3  Anesthesia Plan: General   Post-op Pain Management:    Induction: Intravenous  PONV Risk Score and Plan: 4 or greater and Ondansetron  Airway Management Planned: LMA and Oral ETT  Additional Equipment:   Intra-op Plan:   Post-operative Plan: Extubation in OR  Informed Consent: I have reviewed the patients History and Physical, chart, labs and discussed the procedure including the risks, benefits and alternatives for the proposed anesthesia with the patient or authorized representative who has indicated his/her understanding and acceptance.     Dental advisory given  Plan Discussed with: CRNA and Surgeon  Anesthesia Plan Comments:        Anesthesia Quick Evaluation

## 2021-04-22 NOTE — Interval H&P Note (Signed)
History and Physical Interval Note:  04/22/2021 9:29 AM  Chase Malone  has presented today for surgery, with the diagnosis of uretheral stone.  The various methods of treatment have been discussed with the patient and family. After consideration of risks, benefits and other options for treatment, the patient has consented to  Procedure(s): CYSTOSCOPY WITH LITHOLAPAXY (N/A) HOLMIUM LASER APPLICATION- possible (N/A) as a surgical intervention.  The patient's history has been reviewed, patient examined, no change in status, stable for surgery.  I have reviewed the patient's chart and labs.  Questions were answered to the patient's satisfaction.     Wilkie Aye

## 2021-04-22 NOTE — Transfer of Care (Signed)
Immediate Anesthesia Transfer of Care Note  Patient: JUAN KISSOON  Procedure(s) Performed: CYSTOSCOPY WITH LITHOLAPAXY (Urethra) HOLMIUM LASER APPLICATION (Urethra)  Patient Location: PACU  Anesthesia Type:General  Level of Consciousness: Drowsy  Airway & Oxygen Therapy: Patient Spontanous Breathing and Patient connected to nasal cannula oxygen  Post-op Assessment: Report given to RN and Post -op Vital signs reviewed and stable  Post vital signs: Reviewed and stable  Last Vitals:  Vitals Value Taken Time  BP 119/75   Temp    Pulse 84 04/22/21 1023  Resp 10   SpO2 93 % 04/22/21 1023  Vitals shown include unvalidated device data.  Last Pain:  Vitals:   04/22/21 0900  TempSrc: Oral  PainSc: 0-No pain         Complications: No notable events documented.

## 2021-04-22 NOTE — Anesthesia Postprocedure Evaluation (Signed)
Anesthesia Post Note  Patient: Chase Malone  Procedure(s) Performed: CYSTOSCOPY WITH LITHOLAPAXY (Urethra) HOLMIUM LASER APPLICATION (Urethra)  Patient location during evaluation: PACU Anesthesia Type: General Level of consciousness: awake and alert and oriented Pain management: pain level controlled Vital Signs Assessment: post-procedure vital signs reviewed and stable Respiratory status: spontaneous breathing, nonlabored ventilation and respiratory function stable Cardiovascular status: blood pressure returned to baseline and stable Postop Assessment: no apparent nausea or vomiting Anesthetic complications: no   No notable events documented.   Last Vitals:  Vitals:   04/22/21 1109 04/22/21 1117  BP: 108/89 (!) 143/88  Pulse: 81   Resp: 12 16  Temp:  36.8 C  SpO2: 95% 98%    Last Pain:  Vitals:   04/22/21 1117  TempSrc: Oral  PainSc: 7                  Cheyanna Strick C Ameen Mostafa

## 2021-04-23 ENCOUNTER — Other Ambulatory Visit: Payer: Self-pay

## 2021-04-23 ENCOUNTER — Encounter (HOSPITAL_COMMUNITY): Payer: Self-pay | Admitting: Urology

## 2021-04-23 ENCOUNTER — Telehealth: Payer: Self-pay

## 2021-04-23 NOTE — Telephone Encounter (Signed)
Pt left a Voice Mail:  Needing to discuss taking out stent asap.  Call back:  [phone]  Thanks, Rosey Bath

## 2021-04-23 NOTE — Telephone Encounter (Signed)
Reviewed with Dr. Ronne Binning, patient will come to office Thursday for voiding trial. Patient voiced understanding.

## 2021-04-25 ENCOUNTER — Ambulatory Visit (INDEPENDENT_AMBULATORY_CARE_PROVIDER_SITE_OTHER): Payer: Medicare Other

## 2021-04-25 ENCOUNTER — Other Ambulatory Visit: Payer: Self-pay

## 2021-04-25 DIAGNOSIS — N211 Calculus in urethra: Secondary | ICD-10-CM | POA: Diagnosis not present

## 2021-04-25 NOTE — Progress Notes (Signed)
Fill and Pull Catheter Removal  Patient is present today for a catheter removal.  Patient was cleaned and prepped in a sterile fashion 61ml of sterile water/ saline was instilled into the bladder when the patient felt the urge to urinate. 105ml of water was then drained from the balloon.  A 18FR foley cath was removed from the bladder no complications were noted .  Patient as then given some time to void on their own.  Patient can void  40ml on their own after some time.  Patient tolerated well.  Performed by: Miasia Crabtree LPN  Follow up/ Additional notes: Keep scheduled post op appointment.

## 2021-04-27 LAB — CALCULI, WITH PHOTOGRAPH (CLINICAL LAB)
Calcium Oxalate Monohydrate: 100 %
Weight Calculi: 65 mg

## 2021-05-09 ENCOUNTER — Encounter: Payer: Self-pay | Admitting: Urology

## 2021-05-09 ENCOUNTER — Ambulatory Visit: Payer: Medicare Other | Admitting: Urology

## 2021-05-09 ENCOUNTER — Other Ambulatory Visit: Payer: Self-pay

## 2021-05-09 VITALS — BP 158/81 | HR 90 | Temp 98.1°F

## 2021-05-09 DIAGNOSIS — R3915 Urgency of urination: Secondary | ICD-10-CM

## 2021-05-09 DIAGNOSIS — N138 Other obstructive and reflux uropathy: Secondary | ICD-10-CM | POA: Diagnosis not present

## 2021-05-09 DIAGNOSIS — N211 Calculus in urethra: Secondary | ICD-10-CM

## 2021-05-09 DIAGNOSIS — N401 Enlarged prostate with lower urinary tract symptoms: Secondary | ICD-10-CM | POA: Diagnosis not present

## 2021-05-09 LAB — URINALYSIS, ROUTINE W REFLEX MICROSCOPIC
Bilirubin, UA: NEGATIVE
Ketones, UA: NEGATIVE
Leukocytes,UA: NEGATIVE
Nitrite, UA: NEGATIVE
Protein,UA: NEGATIVE
Specific Gravity, UA: 1.015 (ref 1.005–1.030)
Urobilinogen, Ur: 0.2 mg/dL (ref 0.2–1.0)
pH, UA: 5.5 (ref 5.0–7.5)

## 2021-05-09 LAB — MICROSCOPIC EXAMINATION
Renal Epithel, UA: NONE SEEN /hpf
WBC, UA: NONE SEEN /hpf (ref 0–5)

## 2021-05-09 NOTE — Progress Notes (Signed)
Urological Symptom Review  Patient is experiencing the following symptoms: Frequent urination Hard to postpone urination Get up at night to urinate Leakage of urine Stream starts and stops Weak stream Erection problems (male only) Penile pain (male only)    Review of Systems  Gastrointestinal (upper)  : Indigestion/heartburn  Gastrointestinal (lower) : Negative for lower GI symptoms  Constitutional : Negative for symptoms  Skin: Negative for skin symptoms  Eyes: Negative for eye symptoms  Ear/Nose/Throat : Negative for Ear/Nose/Throat symptoms  Hematologic/Lymphatic: Negative for Hematologic/Lymphatic symptoms  Cardiovascular : Negative for cardiovascular symptoms  Respiratory : Negative for respiratory symptoms  Endocrine: Negative for endocrine symptoms  Musculoskeletal: Negative for musculoskeletal symptoms  Neurological: Headaches  Psychologic: Negative for psychiatric symptoms

## 2021-05-09 NOTE — Progress Notes (Signed)
Subjective: 1. BPH with urinary obstruction   2. Urgency of urination   3. Urethral stone        04/11/21: Chase Malone is a 75 yo male who has previously been seen by Dr. Exie Parody and Dr. Tresa Moore.  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  He has CKD3 since the procedure but it has been stable and the Cr was 1.67 on 01/08/21.  He had prior ESWL for stones.  He returns now with increased voiding symptoms with hesitancy and a dribbling stream.  He has some perineal pain when he sits.   He had a CT in 8/22 for the symptoms and microhematuria and has a 47mm stone in the proximal urethra or prostate that was not noted in the report.   05/09/21: Chase Malone returns today in f/u from a cystolithalopaxy done on 04/22/21 for a 1cm bladder stone.  The stone was calcium oxalate.  He is doing well without further pain.  His IPSS is 16 with some frequency and urgency.  He has nocturia x 2.  He is reasonably content with the LUTS.  He has no dysuria or hematuria.    IPSS     Row Name 05/09/21 1000         International Prostate Symptom Score   How often have you had the sensation of not emptying your bladder? About half the time     How often have you had to urinate less than every two hours? About half the time     How often have you found you stopped and started again several times when you urinated? Less than half the time     How often have you found it difficult to postpone urination? About half the time     How often have you had a weak urinary stream? Less than 1 in 5 times     How often have you had to strain to start urination? Less than 1 in 5 times     How many times did you typically get up at night to urinate? 2 Times     Total IPSS Score 15       Quality of Life due to urinary symptoms   If you were to spend the rest of your life with your urinary condition just  the way it is now how would you feel about that? Mostly Disatisfied              ROS:  ROS  No Known Allergies  Past Medical History:  Diagnosis Date   Anxiety    Arthritis    CKD (chronic kidney disease) stage 3, GFR 30-59 ml/min (HCC)    Depression    GERD (gastroesophageal reflux disease)    History of kidney stones    History of pneumonia 2013   Migraine    Type 2 diabetes mellitus ([place])     Past Surgical History:  Procedure Laterality Date   bilateral cataract surgery      bilateral trigger fingers      2 fingers on each hand    CHOLECYSTECTOMY     CYSTOSCOPY WITH LITHOLAPAXY N/A 04/22/2021   Procedure: CYSTOSCOPY WITH LITHOLAPAXY;  Surgeon: Cleon Gustin, MD;  Location: [facility];  Service: Urology;  Laterality: N/A;   HOLMIUM LASER APPLICATION N/A [id number]   Procedure: HOLMIUM LASER APPLICATION;  Surgeon:  McKenzie, Candee Furbish, MD;  Location: [facility];  Service: Urology;  Laterality: N/A;   left hand carpal tunnel      left knee surgery      right knee surgery      x 3   ROBOT ASSITED LAPAROSCOPIC NEPHROURETERECTOMY Right 06/20/2016   Procedure: XI ROBOT ASSITED LAPAROSCOPIC NEPHROURETERECTOMY extensive adhesiolysis;  Surgeon: Alexis Frock, MD;  Location: [facility];  Service: Urology;  Laterality: Right;   TRIGGER FINGER RELEASE Left 01/10/2021   Procedure: RELEASE TRIGGER FINGER/A-1 PULLEY LEFT INDEX TRIGGER RLEASE AND INCISION ANNULAR LIGAMENT CYST;  Surgeon: Leanora Cover, MD;  Location: [facility];  Service: Orthopedics;  Laterality: Left;  Bier block   VASECTOMY      Social History   Socioeconomic History   Marital status: Married    Spouse name: Not on file   Number of children: Not on file   Years of education: Not on file   Highest education level: Not on file  Occupational History   Not on file  Tobacco Use   Smoking status: Former    Types: Cigarettes    Quit date: 07/07/1973    Years since quitting: 47.8   Smokeless tobacco:  Never  Substance and Sexual Activity   Alcohol use: No   Drug use: No   Sexual activity: Not on file  Other Topics Concern   Not on file  Social History Narrative   Not on file   Social Determinants of Health   Financial Resource Strain: Not on file  Food Insecurity: Not on file  Transportation Needs: Not on file  Physical Activity: Not on file  Stress: Not on file  Social Connections: Not on file  Intimate Partner Violence: Not on file    Family History  Problem Relation Age of Onset   Seizures Mother    Lung cancer Mother    Heart disease Father    Stroke Father     Anti-infectives: Anti-infectives (From admission, onward)    None       Current Outpatient Medications  Medication Sig Dispense Refill   acetaminophen (TYLENOL) 500 MG tablet Take 1,000 mg by mouth every 6 (six) hours as needed for moderate pain.     amitriptyline (ELAVIL) 10 MG tablet Take 10 mg by mouth at bedtime.     aspirin EC 81 MG tablet Take 81 mg by mouth daily.     calcium carbonate (TUMS - DOSED IN MG ELEMENTAL CALCIUM) 500 MG chewable tablet Chew 1 tablet by mouth daily as needed for indigestion or heartburn.     cetirizine (ZYRTEC) 10 MG tablet Take 10 mg by mouth daily.     Cholecalciferol (VITAMIN D3) 50 MCG (2000 UT) TABS Take 2,000 Units by mouth daily.     clonazePAM (KLONOPIN) 1 MG tablet Take 1 mg by mouth at bedtime.     diclofenac Sodium (VOLTAREN) 1 % GEL Apply 1 application topically 4 (four) times daily as needed (pain).     FLUoxetine (PROZAC) 20 MG capsule Take 20 mg by mouth daily.     metFORMIN (GLUCOPHAGE-XR) 500 MG 24 hr tablet Take 1,000 mg by mouth every morning.     Omega-3 Fatty Acids (FISH OIL) 1000 MG CAPS Take 1,000 mg by mouth daily.     pantoprazole (PROTONIX) 40 MG tablet Take 80 mg by mouth every morning.      pregabalin (LYRICA) 75 MG capsule Take 75-150 mg by mouth See admin instructions. 75 mg in the morning  and 150 mg in the evening     simvastatin (ZOCOR)  20 MG tablet Take 20 mg by mouth at bedtime.     SUMAtriptan (IMITREX) 100 MG tablet Take 100 mg by mouth daily as needed for migraine. May repeat in 2 hours if headache persists or recurs.     timolol (BETIMOL) 0.5 % ophthalmic solution Place 1 drop into both eyes daily.     traMADol (ULTRAM) 50 MG tablet Take 1 tablet (50 mg total) by mouth every 6 (six) hours as needed. 15 tablet 0   No current facility-administered medications for this visit.     Objective: Vital signs in last 24 hours: BP (!) 158/81   Pulse 90   Temp 98.1 F (36.7 C)   Intake/Output from previous day: No intake/output data recorded. Intake/Output this shift: @IOTHISSHIFT @   Physical Exam  Lab Results:  Results for orders placed or performed in visit on 05/09/21 (from the past 24 hour(s))  Urinalysis, Routine w reflex microscopic     Status: Abnormal   Collection Time: 05/09/21 10:23 AM  Result Value Ref Range   Specific Gravity, UA 1.015 1.005 - 1.030   pH, UA 5.5 5.0 - 7.5   Color, UA Yellow Yellow   Appearance Ur Clear Clear   Leukocytes,UA Negative Negative   Protein,UA Negative Negative/Trace   Glucose, UA 3+ (A) Negative   Ketones, UA Negative Negative   RBC, UA Trace (A) Negative   Bilirubin, UA Negative Negative   Urobilinogen, Ur 0.2 0.2 - 1.0 mg/dL   Nitrite, UA Negative Negative   Microscopic Examination See below:    Narrative   Performed at:  [address] - [facility] [address], [place], [place]  [place] Lab Director: [id number], Phone:  [phone]  Microscopic Examination     Status: None   Collection Time: 05/09/21 10:23 AM   Urine  Result Value Ref Range   WBC, UA None seen 0 - 5 /hpf   RBC 0-2 0 - 2 /hpf   Epithelial Cells (non renal) 0-10 0 - 10 /hpf   Renal Epithel, UA None seen None seen /hpf   Mucus, UA Present Not Estab.   Bacteria, UA Few None seen/Few   Narrative   Performed at:  [address] - [facility] [address], [place], [place]   [place] Lab Director: [id number] [place], Phone:  [phone]   UA is unremarkable.   BMET No results for input(s): NA, K, CL, CO2, GLUCOSE, BUN, CREATININE, CALCIUM in the last 72 hours. PT/INR No results for input(s): LABPROT, INR in the last 72 hours. ABG No results for input(s): PHART, HCO3 in the last 72 hours.  Invalid input(s): PCO2, PO2  Studies/Results: No results found. Stone analysis reviewed.     Assessment/Plan: Urethral stone.  He is doing well s/p stone removal with moderate LUTS.  BPH with BOO.  He has moderate LUTS but is content with this symptoms.   He will just return prn.    No orders of the defined types were placed in this encounter.    Orders Placed This Encounter  Procedures   Microscopic Examination   Urinalysis, Routine w reflex microscopic     Return if symptoms worsen or fail to improve.    CC: Dr. [id number].     Fara Chute 05/09/2021 [phone]

## 2022-07-04 IMAGING — DX DG ABDOMEN 1V
2 series · 2 of 2 positions shown · non-contrast
Comparison: CT 02/22/2021

CLINICAL DATA: Urethral stone

EXAM:
ABDOMEN - 1 VIEW

[abdomen kub (1 of 2)]
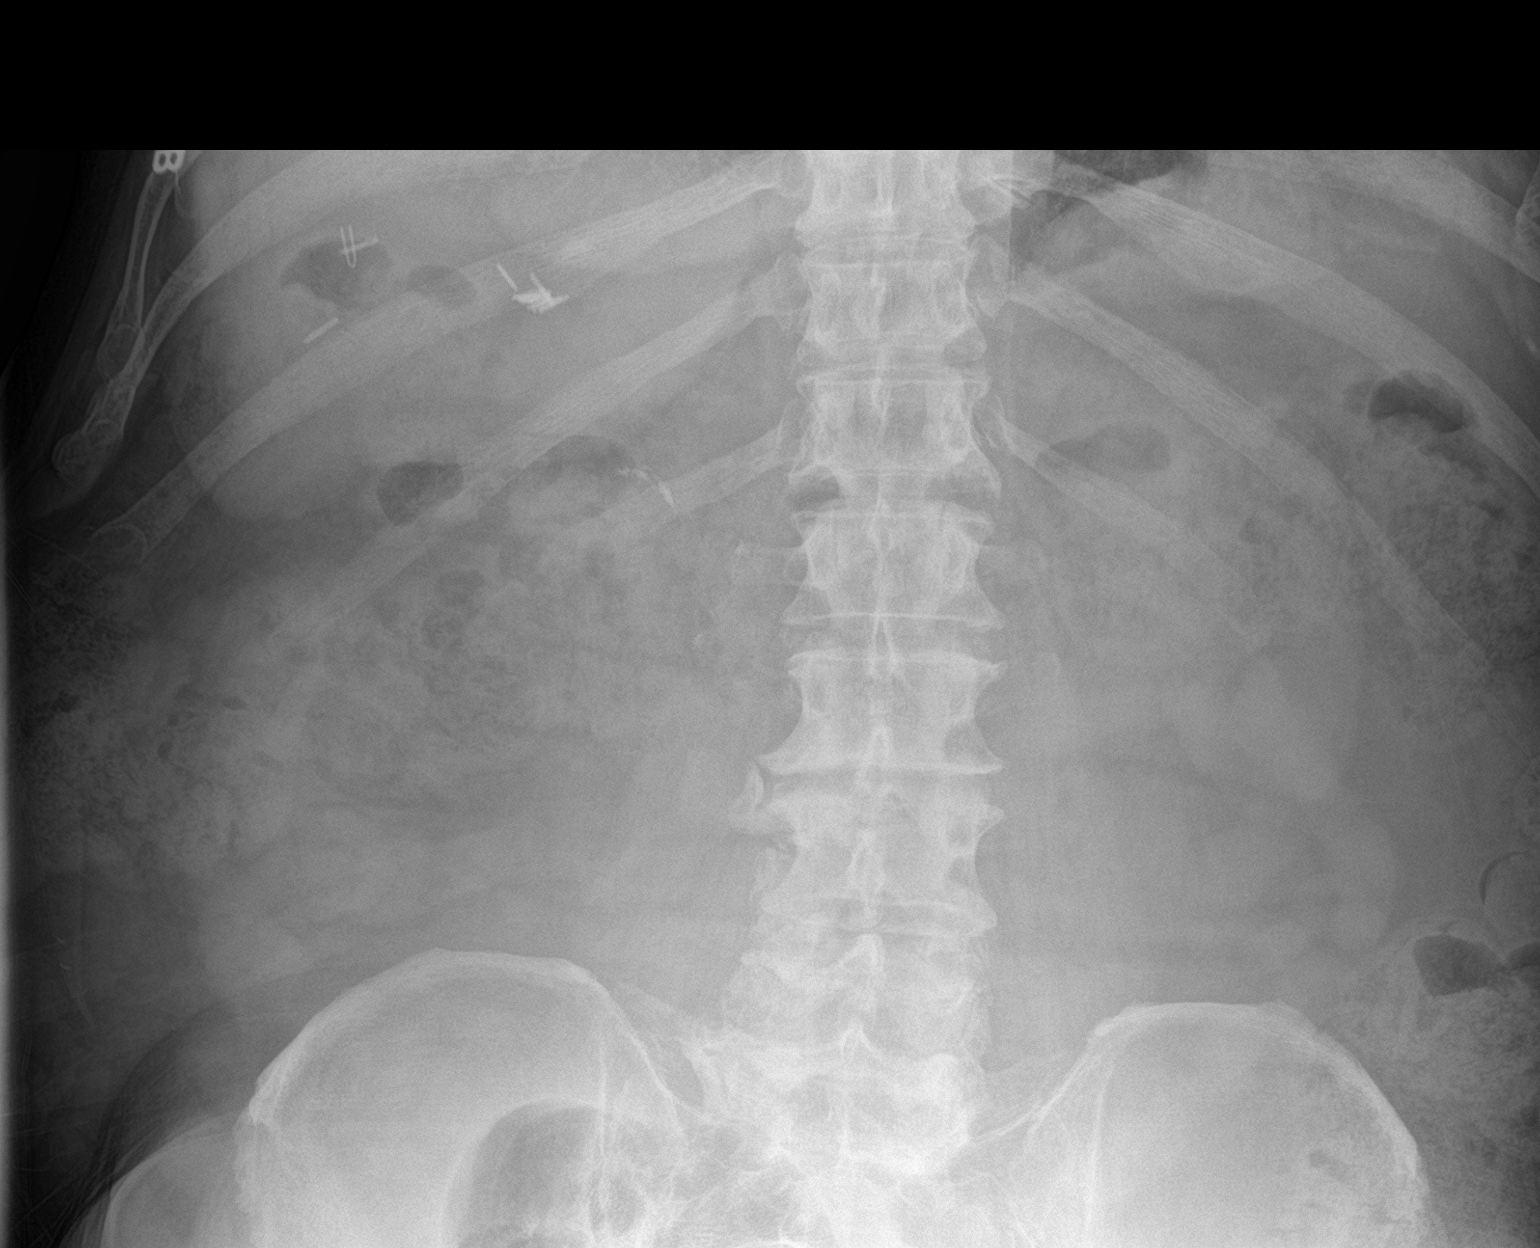

[abdomen kub (2 of 2)]
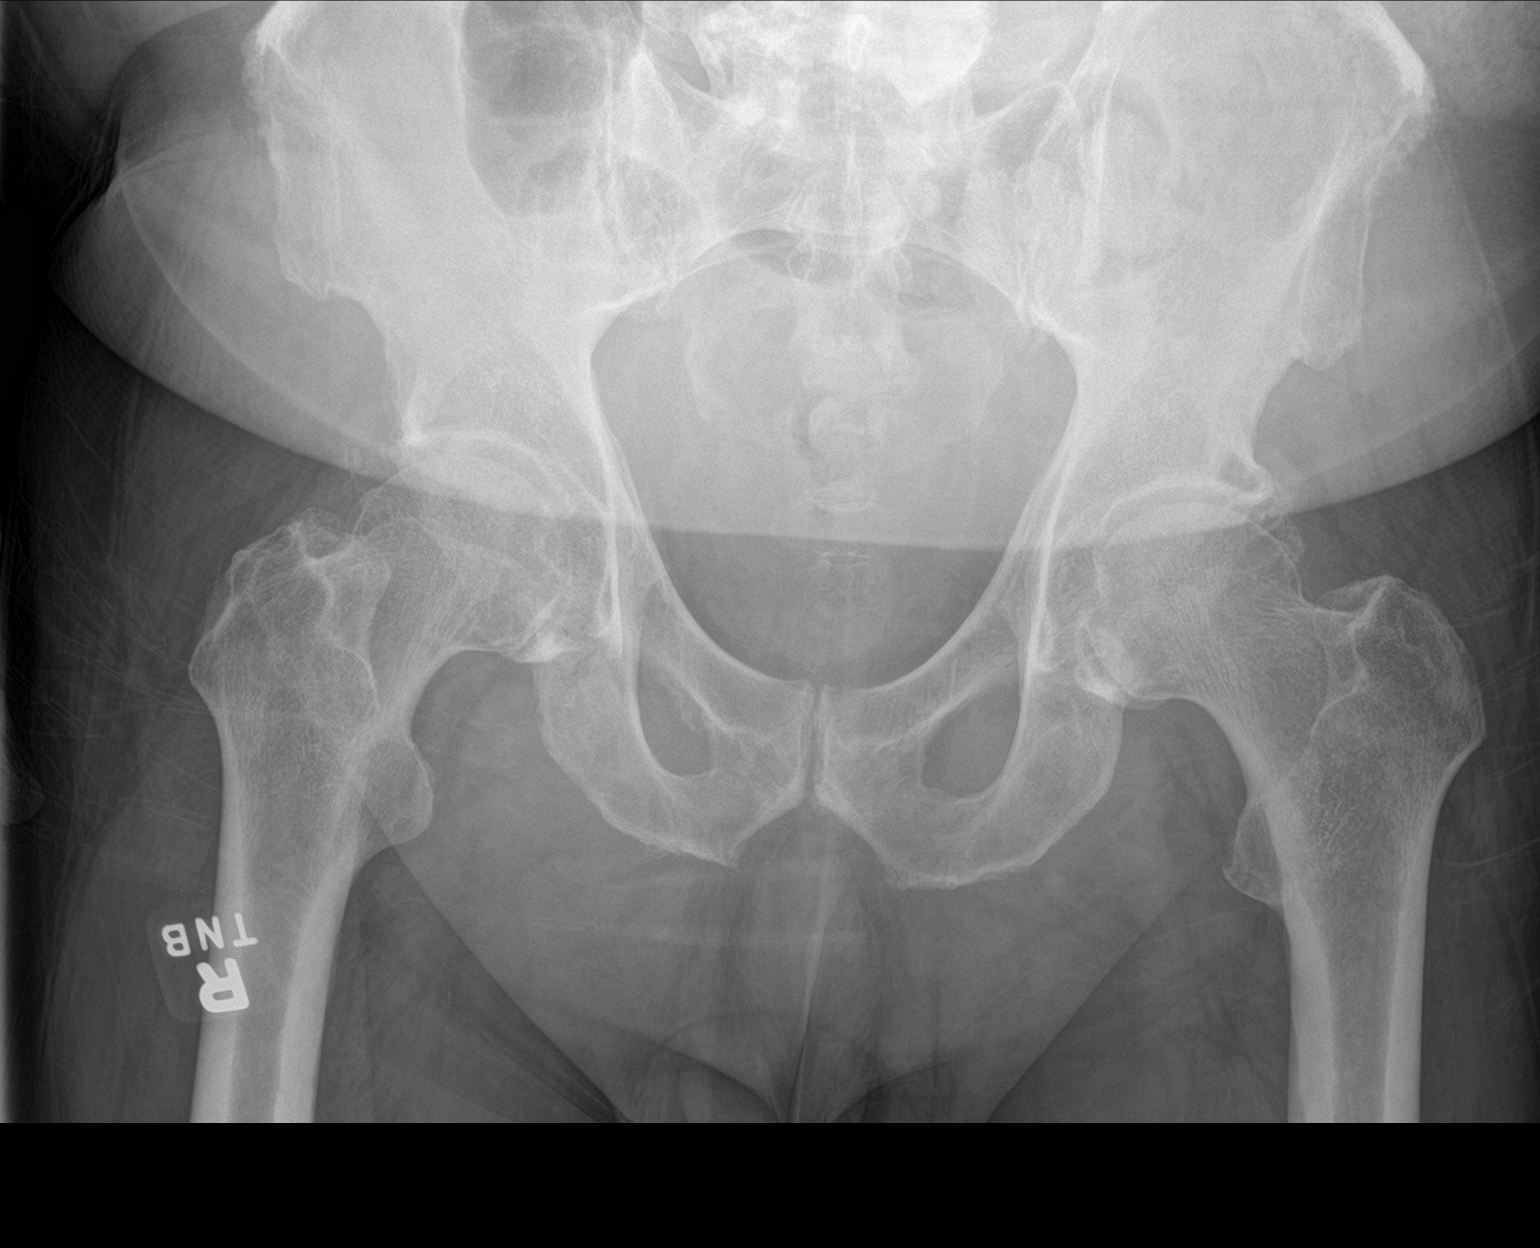

[2 of 2 positions shown; findings below may reference images not displayed]

FINDINGS: The bowel gas pattern is normal. No radio-opaque calculi or other
significant radiographic abnormality are seen. Moderate stool burden
IMPRESSION: Negative.

## 2022-08-27 DIAGNOSIS — D485 Neoplasm of uncertain behavior of skin: Secondary | ICD-10-CM | POA: Diagnosis not present

## 2022-08-27 DIAGNOSIS — L820 Inflamed seborrheic keratosis: Secondary | ICD-10-CM | POA: Diagnosis not present

## 2022-09-02 DIAGNOSIS — K2100 Gastro-esophageal reflux disease with esophagitis, without bleeding: Secondary | ICD-10-CM | POA: Diagnosis not present

## 2022-09-02 DIAGNOSIS — N183 Chronic kidney disease, stage 3 unspecified: Secondary | ICD-10-CM | POA: Diagnosis not present

## 2022-09-02 DIAGNOSIS — E1122 Type 2 diabetes mellitus with diabetic chronic kidney disease: Secondary | ICD-10-CM | POA: Diagnosis not present

## 2022-09-02 DIAGNOSIS — E1165 Type 2 diabetes mellitus with hyperglycemia: Secondary | ICD-10-CM | POA: Diagnosis not present

## 2022-09-02 DIAGNOSIS — E1140 Type 2 diabetes mellitus with diabetic neuropathy, unspecified: Secondary | ICD-10-CM | POA: Diagnosis not present

## 2022-09-02 DIAGNOSIS — E7849 Other hyperlipidemia: Secondary | ICD-10-CM | POA: Diagnosis not present

## 2022-09-08 DIAGNOSIS — M6530 Trigger finger, unspecified finger: Secondary | ICD-10-CM | POA: Diagnosis not present

## 2022-09-08 DIAGNOSIS — R030 Elevated blood-pressure reading, without diagnosis of hypertension: Secondary | ICD-10-CM | POA: Diagnosis not present

## 2022-09-08 DIAGNOSIS — E162 Hypoglycemia, unspecified: Secondary | ICD-10-CM | POA: Diagnosis not present

## 2022-09-08 DIAGNOSIS — Z23 Encounter for immunization: Secondary | ICD-10-CM | POA: Diagnosis not present

## 2022-09-08 DIAGNOSIS — E1140 Type 2 diabetes mellitus with diabetic neuropathy, unspecified: Secondary | ICD-10-CM | POA: Diagnosis not present

## 2022-09-08 DIAGNOSIS — G43009 Migraine without aura, not intractable, without status migrainosus: Secondary | ICD-10-CM | POA: Diagnosis not present

## 2022-09-08 DIAGNOSIS — K2100 Gastro-esophageal reflux disease with esophagitis, without bleeding: Secondary | ICD-10-CM | POA: Diagnosis not present

## 2022-09-08 DIAGNOSIS — E7849 Other hyperlipidemia: Secondary | ICD-10-CM | POA: Diagnosis not present

## 2022-09-08 DIAGNOSIS — Z0001 Encounter for general adult medical examination with abnormal findings: Secondary | ICD-10-CM | POA: Diagnosis not present

## 2022-09-08 DIAGNOSIS — E1122 Type 2 diabetes mellitus with diabetic chronic kidney disease: Secondary | ICD-10-CM | POA: Diagnosis not present

## 2022-10-31 DIAGNOSIS — R42 Dizziness and giddiness: Secondary | ICD-10-CM | POA: Diagnosis not present

## 2022-10-31 DIAGNOSIS — R531 Weakness: Secondary | ICD-10-CM | POA: Diagnosis not present

## 2022-10-31 DIAGNOSIS — R519 Headache, unspecified: Secondary | ICD-10-CM | POA: Diagnosis not present

## 2022-10-31 DIAGNOSIS — R030 Elevated blood-pressure reading, without diagnosis of hypertension: Secondary | ICD-10-CM | POA: Diagnosis not present

## 2022-11-17 DIAGNOSIS — B354 Tinea corporis: Secondary | ICD-10-CM | POA: Diagnosis not present

## 2022-12-30 DIAGNOSIS — E1122 Type 2 diabetes mellitus with diabetic chronic kidney disease: Secondary | ICD-10-CM | POA: Diagnosis not present

## 2022-12-30 DIAGNOSIS — E782 Mixed hyperlipidemia: Secondary | ICD-10-CM | POA: Diagnosis not present

## 2022-12-30 DIAGNOSIS — E1165 Type 2 diabetes mellitus with hyperglycemia: Secondary | ICD-10-CM | POA: Diagnosis not present

## 2022-12-30 DIAGNOSIS — E781 Pure hyperglyceridemia: Secondary | ICD-10-CM | POA: Diagnosis not present

## 2022-12-30 DIAGNOSIS — E7849 Other hyperlipidemia: Secondary | ICD-10-CM | POA: Diagnosis not present

## 2023-01-06 DIAGNOSIS — M6530 Trigger finger, unspecified finger: Secondary | ICD-10-CM | POA: Diagnosis not present

## 2023-01-06 DIAGNOSIS — E7849 Other hyperlipidemia: Secondary | ICD-10-CM | POA: Diagnosis not present

## 2023-01-06 DIAGNOSIS — R030 Elevated blood-pressure reading, without diagnosis of hypertension: Secondary | ICD-10-CM | POA: Diagnosis not present

## 2023-01-06 DIAGNOSIS — N1832 Chronic kidney disease, stage 3b: Secondary | ICD-10-CM | POA: Diagnosis not present

## 2023-01-06 DIAGNOSIS — E1140 Type 2 diabetes mellitus with diabetic neuropathy, unspecified: Secondary | ICD-10-CM | POA: Diagnosis not present

## 2023-01-06 DIAGNOSIS — K2100 Gastro-esophageal reflux disease with esophagitis, without bleeding: Secondary | ICD-10-CM | POA: Diagnosis not present

## 2023-01-06 DIAGNOSIS — G43009 Migraine without aura, not intractable, without status migrainosus: Secondary | ICD-10-CM | POA: Diagnosis not present

## 2023-01-06 DIAGNOSIS — E1122 Type 2 diabetes mellitus with diabetic chronic kidney disease: Secondary | ICD-10-CM | POA: Diagnosis not present

## 2023-01-06 DIAGNOSIS — E162 Hypoglycemia, unspecified: Secondary | ICD-10-CM | POA: Diagnosis not present

## 2023-03-31 DIAGNOSIS — Z23 Encounter for immunization: Secondary | ICD-10-CM | POA: Diagnosis not present

## 2023-05-05 DIAGNOSIS — E7849 Other hyperlipidemia: Secondary | ICD-10-CM | POA: Diagnosis not present

## 2023-05-05 DIAGNOSIS — E1165 Type 2 diabetes mellitus with hyperglycemia: Secondary | ICD-10-CM | POA: Diagnosis not present

## 2023-05-05 DIAGNOSIS — N183 Chronic kidney disease, stage 3 unspecified: Secondary | ICD-10-CM | POA: Diagnosis not present

## 2023-05-05 DIAGNOSIS — E1122 Type 2 diabetes mellitus with diabetic chronic kidney disease: Secondary | ICD-10-CM | POA: Diagnosis not present

## 2023-05-11 DIAGNOSIS — K2100 Gastro-esophageal reflux disease with esophagitis, without bleeding: Secondary | ICD-10-CM | POA: Diagnosis not present

## 2023-05-11 DIAGNOSIS — E1122 Type 2 diabetes mellitus with diabetic chronic kidney disease: Secondary | ICD-10-CM | POA: Diagnosis not present

## 2023-05-11 DIAGNOSIS — M6530 Trigger finger, unspecified finger: Secondary | ICD-10-CM | POA: Diagnosis not present

## 2023-05-11 DIAGNOSIS — E1140 Type 2 diabetes mellitus with diabetic neuropathy, unspecified: Secondary | ICD-10-CM | POA: Diagnosis not present

## 2023-05-11 DIAGNOSIS — R030 Elevated blood-pressure reading, without diagnosis of hypertension: Secondary | ICD-10-CM | POA: Diagnosis not present

## 2023-05-11 DIAGNOSIS — G43009 Migraine without aura, not intractable, without status migrainosus: Secondary | ICD-10-CM | POA: Diagnosis not present

## 2023-05-11 DIAGNOSIS — E7849 Other hyperlipidemia: Secondary | ICD-10-CM | POA: Diagnosis not present

## 2023-05-11 DIAGNOSIS — E162 Hypoglycemia, unspecified: Secondary | ICD-10-CM | POA: Diagnosis not present

## 2023-05-11 DIAGNOSIS — N1832 Chronic kidney disease, stage 3b: Secondary | ICD-10-CM | POA: Diagnosis not present

## 2023-06-17 DIAGNOSIS — H524 Presbyopia: Secondary | ICD-10-CM | POA: Diagnosis not present

## 2023-06-17 DIAGNOSIS — H401131 Primary open-angle glaucoma, bilateral, mild stage: Secondary | ICD-10-CM | POA: Diagnosis not present

## 2023-06-17 DIAGNOSIS — E119 Type 2 diabetes mellitus without complications: Secondary | ICD-10-CM | POA: Diagnosis not present

## 2023-06-17 DIAGNOSIS — Z7984 Long term (current) use of oral hypoglycemic drugs: Secondary | ICD-10-CM | POA: Diagnosis not present

## 2023-06-17 DIAGNOSIS — Z961 Presence of intraocular lens: Secondary | ICD-10-CM | POA: Diagnosis not present

## 2023-06-17 DIAGNOSIS — H0288A Meibomian gland dysfunction right eye, upper and lower eyelids: Secondary | ICD-10-CM | POA: Diagnosis not present

## 2023-08-05 DIAGNOSIS — H401132 Primary open-angle glaucoma, bilateral, moderate stage: Secondary | ICD-10-CM | POA: Diagnosis not present

## 2023-08-05 DIAGNOSIS — Z961 Presence of intraocular lens: Secondary | ICD-10-CM | POA: Diagnosis not present

## 2023-08-25 DIAGNOSIS — L570 Actinic keratosis: Secondary | ICD-10-CM | POA: Diagnosis not present

## 2023-08-25 DIAGNOSIS — L309 Dermatitis, unspecified: Secondary | ICD-10-CM | POA: Diagnosis not present

## 2023-09-02 DIAGNOSIS — H2640 Unspecified secondary cataract: Secondary | ICD-10-CM | POA: Diagnosis not present

## 2023-09-02 DIAGNOSIS — Z961 Presence of intraocular lens: Secondary | ICD-10-CM | POA: Diagnosis not present

## 2023-09-02 DIAGNOSIS — H401132 Primary open-angle glaucoma, bilateral, moderate stage: Secondary | ICD-10-CM | POA: Diagnosis not present

## 2023-09-09 DIAGNOSIS — E7849 Other hyperlipidemia: Secondary | ICD-10-CM | POA: Diagnosis not present

## 2023-09-09 DIAGNOSIS — N1832 Chronic kidney disease, stage 3b: Secondary | ICD-10-CM | POA: Diagnosis not present

## 2023-09-09 DIAGNOSIS — E1122 Type 2 diabetes mellitus with diabetic chronic kidney disease: Secondary | ICD-10-CM | POA: Diagnosis not present

## 2023-09-16 DIAGNOSIS — E782 Mixed hyperlipidemia: Secondary | ICD-10-CM | POA: Diagnosis not present

## 2023-09-16 DIAGNOSIS — N1832 Chronic kidney disease, stage 3b: Secondary | ICD-10-CM | POA: Diagnosis not present

## 2023-09-16 DIAGNOSIS — E1122 Type 2 diabetes mellitus with diabetic chronic kidney disease: Secondary | ICD-10-CM | POA: Diagnosis not present

## 2023-10-07 DIAGNOSIS — Z1389 Encounter for screening for other disorder: Secondary | ICD-10-CM | POA: Diagnosis not present

## 2023-10-07 DIAGNOSIS — Z0000 Encounter for general adult medical examination without abnormal findings: Secondary | ICD-10-CM | POA: Diagnosis not present

## 2023-12-08 DIAGNOSIS — D485 Neoplasm of uncertain behavior of skin: Secondary | ICD-10-CM | POA: Diagnosis not present

## 2023-12-08 DIAGNOSIS — L821 Other seborrheic keratosis: Secondary | ICD-10-CM | POA: Diagnosis not present

## 2024-01-11 DIAGNOSIS — N1832 Chronic kidney disease, stage 3b: Secondary | ICD-10-CM | POA: Diagnosis not present

## 2024-01-11 DIAGNOSIS — E1122 Type 2 diabetes mellitus with diabetic chronic kidney disease: Secondary | ICD-10-CM | POA: Diagnosis not present

## 2024-01-11 DIAGNOSIS — Z0001 Encounter for general adult medical examination with abnormal findings: Secondary | ICD-10-CM | POA: Diagnosis not present

## 2024-01-11 DIAGNOSIS — E781 Pure hyperglyceridemia: Secondary | ICD-10-CM | POA: Diagnosis not present

## 2024-01-11 DIAGNOSIS — E7849 Other hyperlipidemia: Secondary | ICD-10-CM | POA: Diagnosis not present

## 2024-01-15 DIAGNOSIS — N1832 Chronic kidney disease, stage 3b: Secondary | ICD-10-CM | POA: Diagnosis not present

## 2024-01-15 DIAGNOSIS — E782 Mixed hyperlipidemia: Secondary | ICD-10-CM | POA: Diagnosis not present

## 2024-01-15 DIAGNOSIS — E1122 Type 2 diabetes mellitus with diabetic chronic kidney disease: Secondary | ICD-10-CM | POA: Diagnosis not present

## 2024-03-30 DIAGNOSIS — H401132 Primary open-angle glaucoma, bilateral, moderate stage: Secondary | ICD-10-CM | POA: Diagnosis not present

## 2024-03-30 DIAGNOSIS — H2640 Unspecified secondary cataract: Secondary | ICD-10-CM | POA: Diagnosis not present

## 2024-03-30 DIAGNOSIS — Z961 Presence of intraocular lens: Secondary | ICD-10-CM | POA: Diagnosis not present

## 2024-05-16 ENCOUNTER — Telehealth: Payer: Self-pay | Admitting: Neurology

## 2024-05-16 NOTE — Telephone Encounter (Signed)
 Wife states appointment not needed

## 2024-05-18 ENCOUNTER — Ambulatory Visit: Admitting: Neurology

## 2024-07-02 ENCOUNTER — Emergency Department (HOSPITAL_COMMUNITY)

## 2024-07-02 ENCOUNTER — Other Ambulatory Visit: Payer: Self-pay

## 2024-07-02 ENCOUNTER — Inpatient Hospital Stay (HOSPITAL_COMMUNITY)
Admission: EM | Admit: 2024-07-02 | Discharge: 2024-07-11 | DRG: 871 | Disposition: A | Discharge status: Skilled Nursing Facility | Attending: Internal Medicine | Admitting: Internal Medicine

## 2024-07-02 DIAGNOSIS — Z801 Family history of malignant neoplasm of trachea, bronchus and lung: Secondary | ICD-10-CM | POA: Diagnosis not present

## 2024-07-02 DIAGNOSIS — Z79899 Other long term (current) drug therapy: Secondary | ICD-10-CM

## 2024-07-02 DIAGNOSIS — J9801 Acute bronchospasm: Secondary | ICD-10-CM | POA: Diagnosis present

## 2024-07-02 DIAGNOSIS — D6959 Other secondary thrombocytopenia: Secondary | ICD-10-CM | POA: Diagnosis present

## 2024-07-02 DIAGNOSIS — I130 Hypertensive heart and chronic kidney disease with heart failure and stage 1 through stage 4 chronic kidney disease, or unspecified chronic kidney disease: Secondary | ICD-10-CM | POA: Diagnosis present

## 2024-07-02 DIAGNOSIS — Z7982 Long term (current) use of aspirin: Secondary | ICD-10-CM | POA: Diagnosis not present

## 2024-07-02 DIAGNOSIS — E782 Mixed hyperlipidemia: Secondary | ICD-10-CM | POA: Diagnosis present

## 2024-07-02 DIAGNOSIS — J101 Influenza due to other identified influenza virus with other respiratory manifestations: Secondary | ICD-10-CM | POA: Diagnosis present

## 2024-07-02 DIAGNOSIS — E1122 Type 2 diabetes mellitus with diabetic chronic kidney disease: Secondary | ICD-10-CM | POA: Diagnosis present

## 2024-07-02 DIAGNOSIS — F32A Depression, unspecified: Secondary | ICD-10-CM | POA: Diagnosis present

## 2024-07-02 DIAGNOSIS — Z1152 Encounter for screening for COVID-19: Secondary | ICD-10-CM

## 2024-07-02 DIAGNOSIS — N1832 Chronic kidney disease, stage 3b: Secondary | ICD-10-CM | POA: Diagnosis present

## 2024-07-02 DIAGNOSIS — Z823 Family history of stroke: Secondary | ICD-10-CM

## 2024-07-02 DIAGNOSIS — E1165 Type 2 diabetes mellitus with hyperglycemia: Secondary | ICD-10-CM | POA: Diagnosis present

## 2024-07-02 DIAGNOSIS — I5031 Acute diastolic (congestive) heart failure: Secondary | ICD-10-CM | POA: Diagnosis present

## 2024-07-02 DIAGNOSIS — Z87891 Personal history of nicotine dependence: Secondary | ICD-10-CM | POA: Diagnosis not present

## 2024-07-02 DIAGNOSIS — J9601 Acute respiratory failure with hypoxia: Secondary | ICD-10-CM | POA: Diagnosis present

## 2024-07-02 DIAGNOSIS — R5381 Other malaise: Secondary | ICD-10-CM | POA: Diagnosis not present

## 2024-07-02 DIAGNOSIS — K219 Gastro-esophageal reflux disease without esophagitis: Secondary | ICD-10-CM | POA: Diagnosis present

## 2024-07-02 DIAGNOSIS — A419 Sepsis, unspecified organism: Principal | ICD-10-CM | POA: Diagnosis present

## 2024-07-02 DIAGNOSIS — Y92003 Bedroom of unspecified non-institutional (private) residence as the place of occurrence of the external cause: Secondary | ICD-10-CM

## 2024-07-02 DIAGNOSIS — D696 Thrombocytopenia, unspecified: Secondary | ICD-10-CM | POA: Diagnosis not present

## 2024-07-02 DIAGNOSIS — Z7984 Long term (current) use of oral hypoglycemic drugs: Secondary | ICD-10-CM

## 2024-07-02 DIAGNOSIS — F419 Anxiety disorder, unspecified: Secondary | ICD-10-CM | POA: Diagnosis present

## 2024-07-02 DIAGNOSIS — E1140 Type 2 diabetes mellitus with diabetic neuropathy, unspecified: Secondary | ICD-10-CM

## 2024-07-02 DIAGNOSIS — J984 Other disorders of lung: Secondary | ICD-10-CM | POA: Diagnosis present

## 2024-07-02 DIAGNOSIS — W1830XA Fall on same level, unspecified, initial encounter: Secondary | ICD-10-CM | POA: Diagnosis present

## 2024-07-02 DIAGNOSIS — Z8249 Family history of ischemic heart disease and other diseases of the circulatory system: Secondary | ICD-10-CM

## 2024-07-02 DIAGNOSIS — E872 Acidosis, unspecified: Secondary | ICD-10-CM | POA: Diagnosis present

## 2024-07-02 LAB — CBC WITH DIFFERENTIAL/PLATELET
Abs Immature Granulocytes: 0.04 K/uL (ref 0.00–0.07)
Basophils Absolute: 0 K/uL (ref 0.0–0.1)
Basophils Relative: 0 %
Eosinophils Absolute: 0 K/uL (ref 0.0–0.5)
Eosinophils Relative: 0 %
HCT: 35.5 % — ABNORMAL LOW (ref 39.0–52.0)
Hemoglobin: 11.6 g/dL — ABNORMAL LOW (ref 13.0–17.0)
Immature Granulocytes: 1 %
Lymphocytes Relative: 6 %
Lymphs Abs: 0.4 K/uL — ABNORMAL LOW (ref 0.7–4.0)
MCH: 30.9 pg (ref 26.0–34.0)
MCHC: 32.7 g/dL (ref 30.0–36.0)
MCV: 94.4 fL (ref 80.0–100.0)
Monocytes Absolute: 0.6 K/uL (ref 0.1–1.0)
Monocytes Relative: 8 %
Neutro Abs: 5.6 K/uL (ref 1.7–7.7)
Neutrophils Relative %: 85 %
Platelets: 100 K/uL — ABNORMAL LOW (ref 150–400)
RBC: 3.76 MIL/uL — ABNORMAL LOW (ref 4.22–5.81)
RDW: 14.9 % (ref 11.5–15.5)
WBC: 6.7 K/uL (ref 4.0–10.5)
nRBC: 0 % (ref 0.0–0.2)

## 2024-07-02 LAB — RESP PANEL BY RT-PCR (RSV, FLU A&B, COVID)  RVPGX2
Influenza A by PCR: POSITIVE — AB
Influenza B by PCR: NEGATIVE
Resp Syncytial Virus by PCR: NEGATIVE
SARS Coronavirus 2 by RT PCR: NEGATIVE

## 2024-07-02 LAB — COMPREHENSIVE METABOLIC PANEL WITH GFR
ALT: 15 U/L (ref 0–44)
AST: 37 U/L (ref 15–41)
Albumin: 3.5 g/dL (ref 3.5–5.0)
Alkaline Phosphatase: 68 U/L (ref 38–126)
Anion gap: 12 (ref 5–15)
BUN: 13 mg/dL (ref 8–23)
CO2: 21 mmol/L — ABNORMAL LOW (ref 22–32)
Calcium: 8.9 mg/dL (ref 8.9–10.3)
Chloride: 101 mmol/L (ref 98–111)
Creatinine, Ser: 1.89 mg/dL — ABNORMAL HIGH (ref 0.61–1.24)
GFR, Estimated: 35 mL/min — ABNORMAL LOW
Glucose, Bld: 155 mg/dL — ABNORMAL HIGH (ref 70–99)
Potassium: 4.3 mmol/L (ref 3.5–5.1)
Sodium: 135 mmol/L (ref 135–145)
Total Bilirubin: 0.9 mg/dL (ref 0.0–1.2)
Total Protein: 6.1 g/dL — ABNORMAL LOW (ref 6.5–8.1)

## 2024-07-02 LAB — BLOOD GAS, VENOUS
Acid-Base Excess: 0.3 mmol/L (ref 0.0–2.0)
Bicarbonate: 23.3 mmol/L (ref 20.0–28.0)
Drawn by: 52812
O2 Saturation: 84.8 %
Patient temperature: 38.2
pCO2, Ven: 34 mmHg — ABNORMAL LOW (ref 44–60)
pH, Ven: 7.45 — ABNORMAL HIGH (ref 7.25–7.43)
pO2, Ven: 56 mmHg — ABNORMAL HIGH (ref 32–45)

## 2024-07-02 LAB — URINALYSIS, ROUTINE W REFLEX MICROSCOPIC
Bacteria, UA: NONE SEEN
Bilirubin Urine: NEGATIVE
Glucose, UA: NEGATIVE mg/dL
Ketones, ur: 5 mg/dL — AB
Leukocytes,Ua: NEGATIVE
Nitrite: NEGATIVE
Protein, ur: 30 mg/dL — AB
Specific Gravity, Urine: 1.019 (ref 1.005–1.030)
pH: 5 (ref 5.0–8.0)

## 2024-07-02 LAB — LACTIC ACID, PLASMA
Lactic Acid, Venous: 3 mmol/L (ref 0.5–1.9)
Lactic Acid, Venous: 3.4 mmol/L (ref 0.5–1.9)
Lactic Acid, Venous: 3.1 mmol/L (ref 0.5–1.9)
Lactic Acid, Venous: 2.8 mmol/L (ref 0.5–1.9)

## 2024-07-02 LAB — PROTIME-INR
INR: 1.2 (ref 0.8–1.2)
Prothrombin Time: 15.4 s — ABNORMAL HIGH (ref 11.4–15.2)

## 2024-07-02 LAB — TSH: TSH: 2.04 u[IU]/mL (ref 0.350–4.500)

## 2024-07-02 LAB — PROCALCITONIN: Procalcitonin: 0.4 ng/mL

## 2024-07-02 LAB — PRO BRAIN NATRIURETIC PEPTIDE: Pro Brain Natriuretic Peptide: 5450 pg/mL — ABNORMAL HIGH

## 2024-07-02 LAB — GLUCOSE, CAPILLARY
Glucose-Capillary: 133 mg/dL — ABNORMAL HIGH (ref 70–99)
Glucose-Capillary: 177 mg/dL — ABNORMAL HIGH (ref 70–99)

## 2024-07-02 LAB — HEMOGLOBIN A1C
Hgb A1c MFr Bld: 6.1 % — ABNORMAL HIGH (ref 4.8–5.6)
Mean Plasma Glucose: 128.37 mg/dL

## 2024-07-02 LAB — VITAMIN B12: Vitamin B-12: 251 pg/mL (ref 180–914)

## 2024-07-02 LAB — AMMONIA: Ammonia: 51 umol/L — ABNORMAL HIGH (ref 9–35)

## 2024-07-02 LAB — CBG MONITORING, ED: Glucose-Capillary: 158 mg/dL — ABNORMAL HIGH (ref 70–99)

## 2024-07-02 LAB — FOLATE: Folate: 3.5 ng/mL — ABNORMAL LOW

## 2024-07-02 LAB — T4, FREE: Free T4: 1.35 ng/dL (ref 0.80–2.00)

## 2024-07-02 MED ORDER — SODIUM CHLORIDE 0.9 % IV SOLN
1.0000 g | INTRAVENOUS | Status: AC
  Administered 2024-07-02 – 2024-07-05 (×5): 1 g via INTRAVENOUS
  Filled 2024-07-02 (×2): qty 10

## 2024-07-02 MED ORDER — ONDANSETRON HCL 4 MG/2ML IJ SOLN: 4.0000 mg | Freq: Four times a day (QID) | INTRAMUSCULAR | Status: DC | PRN

## 2024-07-02 MED ORDER — VANCOMYCIN HCL 1500 MG/300ML IV SOLN
1500.0000 mg | Freq: Once | INTRAVENOUS | Status: AC
  Administered 2024-07-02: 1500 mg via INTRAVENOUS
  Filled 2024-07-02: qty 300

## 2024-07-02 MED ORDER — ACETAMINOPHEN 650 MG RE SUPP: 650.0000 mg | Freq: Four times a day (QID) | RECTAL | Status: DC | PRN

## 2024-07-02 MED ORDER — AMITRIPTYLINE HCL 10 MG PO TABS
10.0000 mg | ORAL_TABLET | Freq: Every day | ORAL | Status: DC
  Administered 2024-07-02 – 2024-07-10 (×9): 10 mg via ORAL
  Filled 2024-07-02 (×2): qty 1

## 2024-07-02 MED ORDER — BUDESONIDE 0.5 MG/2ML IN SUSP
0.5000 mg | Freq: Two times a day (BID) | RESPIRATORY_TRACT | Status: DC
  Administered 2024-07-02 – 2024-07-11 (×18): 0.5 mg via RESPIRATORY_TRACT
  Filled 2024-07-02 (×4): qty 2

## 2024-07-02 MED ORDER — ACETAMINOPHEN 500 MG PO TABS
1000.0000 mg | ORAL_TABLET | Freq: Once | ORAL | Status: AC
  Administered 2024-07-02: 1000 mg via ORAL
  Filled 2024-07-02: qty 2

## 2024-07-02 MED ORDER — METHYLPREDNISOLONE SODIUM SUCC 125 MG IJ SOLR
60.0000 mg | Freq: Two times a day (BID) | INTRAMUSCULAR | Status: DC
  Administered 2024-07-02 – 2024-07-06 (×8): 60 mg via INTRAVENOUS
  Filled 2024-07-02 (×3): qty 2

## 2024-07-02 MED ORDER — LACTATED RINGERS IV SOLN: INTRAVENOUS | Status: DC

## 2024-07-02 MED ORDER — IPRATROPIUM-ALBUTEROL 0.5-2.5 (3) MG/3ML IN SOLN
3.0000 mL | Freq: Four times a day (QID) | RESPIRATORY_TRACT | Status: DC
  Administered 2024-07-02 (×2): 3 mL via RESPIRATORY_TRACT
  Filled 2024-07-02 (×2): qty 3

## 2024-07-02 MED ORDER — LACTATED RINGERS IV BOLUS
1000.0000 mL | Freq: Once | INTRAVENOUS | Status: AC
  Administered 2024-07-02: 1000 mL via INTRAVENOUS

## 2024-07-02 MED ORDER — ARFORMOTEROL TARTRATE 15 MCG/2ML IN NEBU
15.0000 ug | INHALATION_SOLUTION | Freq: Two times a day (BID) | RESPIRATORY_TRACT | Status: DC
  Administered 2024-07-02 – 2024-07-11 (×18): 15 ug via RESPIRATORY_TRACT
  Filled 2024-07-02 (×4): qty 2

## 2024-07-02 MED ORDER — FLUOXETINE HCL 20 MG PO CAPS
20.0000 mg | ORAL_CAPSULE | Freq: Every morning | ORAL | Status: DC
  Administered 2024-07-03 – 2024-07-11 (×9): 20 mg via ORAL
  Filled 2024-07-02 (×2): qty 1

## 2024-07-02 MED ORDER — CALCIUM CARBONATE ANTACID 500 MG PO CHEW: 1.0000 | CHEWABLE_TABLET | Freq: Every day | ORAL | Status: DC | PRN

## 2024-07-02 MED ORDER — TIMOLOL MALEATE 0.5 % OP SOLN
1.0000 [drp] | Freq: Every morning | OPHTHALMIC | Status: DC
  Administered 2024-07-02 – 2024-07-11 (×10): 1 [drp] via OPHTHALMIC
  Filled 2024-07-02: qty 5

## 2024-07-02 MED ORDER — CLONAZEPAM 0.5 MG PO TABS
1.0000 mg | ORAL_TABLET | Freq: Every day | ORAL | Status: DC
  Administered 2024-07-02 – 2024-07-10 (×9): 1 mg via ORAL
  Filled 2024-07-02 (×2): qty 2

## 2024-07-02 MED ORDER — VITAMIN D 25 MCG (1000 UNIT) PO TABS
2000.0000 [IU] | ORAL_TABLET | Freq: Every morning | ORAL | Status: DC
  Administered 2024-07-03 – 2024-07-11 (×9): 2000 [IU] via ORAL
  Filled 2024-07-02 (×2): qty 2

## 2024-07-02 MED ORDER — PREGABALIN 75 MG PO CAPS: 75.0000 mg | ORAL_CAPSULE | ORAL | Status: DC

## 2024-07-02 MED ORDER — FUROSEMIDE 10 MG/ML IJ SOLN
40.0000 mg | Freq: Once | INTRAMUSCULAR | Status: AC
  Administered 2024-07-02: 40 mg via INTRAVENOUS
  Filled 2024-07-02: qty 4

## 2024-07-02 MED ORDER — INSULIN ASPART 100 UNIT/ML IJ SOLN
0.0000 [IU] | Freq: Three times a day (TID) | INTRAMUSCULAR | Status: DC
  Administered 2024-07-02 – 2024-07-03 (×2): 1 [IU] via SUBCUTANEOUS
  Administered 2024-07-03: 2 [IU] via SUBCUTANEOUS
  Administered 2024-07-03: 3 [IU] via SUBCUTANEOUS
  Administered 2024-07-04: 2 [IU] via SUBCUTANEOUS
  Administered 2024-07-04: 5 [IU] via SUBCUTANEOUS
  Administered 2024-07-04 – 2024-07-05 (×3): 2 [IU] via SUBCUTANEOUS
  Administered 2024-07-05: 5 [IU] via SUBCUTANEOUS
  Administered 2024-07-06: 3 [IU] via SUBCUTANEOUS
  Administered 2024-07-06 – 2024-07-07 (×3): 7 [IU] via SUBCUTANEOUS
  Administered 2024-07-07: 5 [IU] via SUBCUTANEOUS
  Administered 2024-07-07 – 2024-07-08 (×3): 3 [IU] via SUBCUTANEOUS
  Administered 2024-07-08: 5 [IU] via SUBCUTANEOUS
  Administered 2024-07-09: 6 [IU] via SUBCUTANEOUS
  Administered 2024-07-09: 3 [IU] via SUBCUTANEOUS
  Administered 2024-07-09 – 2024-07-10 (×3): 2 [IU] via SUBCUTANEOUS
  Administered 2024-07-10: 5 [IU] via SUBCUTANEOUS
  Administered 2024-07-11 (×2): 2 [IU] via SUBCUTANEOUS
  Filled 2024-07-02 (×6): qty 1

## 2024-07-02 MED ORDER — PANTOPRAZOLE SODIUM 40 MG PO TBEC
80.0000 mg | DELAYED_RELEASE_TABLET | ORAL | Status: DC
  Administered 2024-07-03 – 2024-07-11 (×9): 80 mg via ORAL
  Filled 2024-07-02 (×2): qty 2

## 2024-07-02 MED ORDER — VANCOMYCIN HCL 10 G IV SOLR: 20.0000 mg/kg | Freq: Once | INTRAVENOUS | Status: DC

## 2024-07-02 MED ORDER — SIMVASTATIN 20 MG PO TABS
20.0000 mg | ORAL_TABLET | Freq: Every day | ORAL | Status: DC
  Administered 2024-07-02 – 2024-07-10 (×9): 20 mg via ORAL
  Filled 2024-07-02 (×2): qty 1

## 2024-07-02 MED ORDER — PREGABALIN 75 MG PO CAPS
75.0000 mg | ORAL_CAPSULE | Freq: Every day | ORAL | Status: DC
  Administered 2024-07-03 – 2024-07-11 (×9): 75 mg via ORAL
  Filled 2024-07-02 (×2): qty 1

## 2024-07-02 MED ORDER — ACETAMINOPHEN 325 MG PO TABS
650.0000 mg | ORAL_TABLET | Freq: Four times a day (QID) | ORAL | Status: DC | PRN
  Administered 2024-07-11: 650 mg via ORAL
  Filled 2024-07-02: qty 2

## 2024-07-02 MED ORDER — AZITHROMYCIN 250 MG PO TABS
500.0000 mg | ORAL_TABLET | Freq: Every day | ORAL | Status: AC
  Administered 2024-07-02 – 2024-07-06 (×5): 500 mg via ORAL
  Filled 2024-07-02 (×2): qty 2

## 2024-07-02 MED ORDER — OSELTAMIVIR PHOSPHATE 30 MG PO CAPS
30.0000 mg | ORAL_CAPSULE | Freq: Two times a day (BID) | ORAL | Status: DC
  Administered 2024-07-02 – 2024-07-03 (×3): 30 mg via ORAL
  Filled 2024-07-02 (×3): qty 1

## 2024-07-02 MED ORDER — LORATADINE 10 MG PO TABS
10.0000 mg | ORAL_TABLET | Freq: Every day | ORAL | Status: DC
  Administered 2024-07-03 – 2024-07-11 (×9): 10 mg via ORAL
  Filled 2024-07-02 (×2): qty 1

## 2024-07-02 MED ORDER — IPRATROPIUM-ALBUTEROL 0.5-2.5 (3) MG/3ML IN SOLN: 3.0000 mL | RESPIRATORY_TRACT | Status: DC | PRN

## 2024-07-02 MED ORDER — ONDANSETRON HCL 4 MG PO TABS: 4.0000 mg | ORAL_TABLET | Freq: Four times a day (QID) | ORAL | Status: DC | PRN

## 2024-07-02 MED ORDER — SODIUM CHLORIDE 0.9 % IV SOLN
2.0000 g | Freq: Once | INTRAVENOUS | Status: AC
  Administered 2024-07-02: 2 g via INTRAVENOUS
  Filled 2024-07-02: qty 12.5

## 2024-07-02 MED ORDER — INSULIN ASPART 100 UNIT/ML IJ SOLN
0.0000 [IU] | Freq: Every day | INTRAMUSCULAR | Status: DC
  Administered 2024-07-04 – 2024-07-06 (×3): 3 [IU] via SUBCUTANEOUS
  Administered 2024-07-08: 2 [IU] via SUBCUTANEOUS
  Administered 2024-07-09: 3 [IU] via SUBCUTANEOUS
  Administered 2024-07-10: 4 [IU] via SUBCUTANEOUS

## 2024-07-02 MED ORDER — ASPIRIN 81 MG PO TBEC
81.0000 mg | DELAYED_RELEASE_TABLET | Freq: Every day | ORAL | Status: DC
  Administered 2024-07-02 – 2024-07-11 (×10): 81 mg via ORAL
  Filled 2024-07-02 (×3): qty 1

## 2024-07-02 MED ORDER — PREGABALIN 75 MG PO CAPS
150.0000 mg | ORAL_CAPSULE | Freq: Every day | ORAL | Status: DC
  Administered 2024-07-03 – 2024-07-10 (×8): 150 mg via ORAL
  Filled 2024-07-02: qty 2

## 2024-07-02 NOTE — ED Notes (Signed)
 XR at bedside

## 2024-07-02 NOTE — H&P (Signed)
 " History and Physical    Patient: Chase Malone FMW:[id number] DOB: 30-Jul-1945 DOA: 07/02/2024 DOS: the patient was seen and examined on 07/02/2024 PCP: [facility]  Patient coming from: Home  Chief Complaint:  Chief Complaint  Patient presents with   Weakness   HPI: Chase Malone is a 78 year old male with a history of diabetes mellitus type 2, hypertension, hyperlipidemia, CKD stage III, mild cognitive impairment, anxiety/depression presenting with at least 1 week history of generalized weakness, coughing, shortness of breath, chest congestion.  He states that his cough is mostly clear sputum.  Wife is at the bedside to supplement history.  The patient was trying to go to the bathroom to shave.  He required the help of his grandson as the patient was weak and unsteady.  Wife states that he has been having difficulty getting out of bed because of increasing generalized weakness.  Subsequently, the patient made his way back to the bedroom when he fell.  Fortunately, the patient's grandson and wife were near him and were able to ease him onto the floor.  The patient was found on the floor by EMS.  He seemed confused.  Patient was noted to have oxygen saturation in the low 90s.  He was placed on 3 L by EMS. Wife states that the patient has had a functional decline over the past 2 months.  He has had decreased oral intake for the past week.  He had 2 falls this past week.  She states that he has not been placed on any new medications.  There is not been any complaints of chest pain, hemoptysis, nausea, vomiting, diarrhea, abdominal pain, dysuria, hematuria.  In the ED, the patient was febrile up to 100.7 F.  He was hemodynamically stable.  Oxygen saturation was 90% on room air.  He was placed on 2 L with oxygen up to 95%.  WBC 6.7, hemoglobin 1.6, platelets 100,000.  Sodium 135, potassium 4.3, bicarbonate 21, serum creatinine 1.89.  AST 37, ALT 15, alk phosphatase 68, total bilirubin  0.9.  Lactic acid 3.0>> 3.4.  VBG showed 7.4 5/34/56/23.  Chest x-ray showed mild diffuse interstitial opacities.  CT brain was negative for any acute findings.  Influenza A was positive.  Ammonia 51.  EKG showed sinus rhythm nonspecific ST changes.  The patient was started on oseltamavir, vancomycin , cefepime .  He was given 3 L of LR bolus.  Review of Systems: As mentioned in the history of present illness. All other systems reviewed and are negative. Past Medical History:  Diagnosis Date   Anxiety    Arthritis    CKD (chronic kidney disease) stage 3, GFR 30-59 ml/min (HCC)    Depression    GERD (gastroesophageal reflux disease)    History of kidney stones    History of pneumonia 2013   Migraine    Type 2 diabetes mellitus (HCC)    Past Surgical History:  Procedure Laterality Date   bilateral cataract surgery      bilateral trigger fingers      2 fingers on each hand    CHOLECYSTECTOMY     CYSTOSCOPY WITH LITHOLAPAXY N/A 04/22/2021   Procedure: CYSTOSCOPY WITH LITHOLAPAXY;  Surgeon: Sherrilee Belvie CROME, MD;  Location: [facility];  Service: Urology;  Laterality: N/A;   HOLMIUM LASER APPLICATION N/A 04/22/2021   Procedure: HOLMIUM LASER APPLICATION;  Surgeon: Sherrilee Belvie CROME, MD;  Location: [facility];  Service: Urology;  Laterality: N/A;   left hand carpal tunnel  left knee surgery      right knee surgery      x 3   ROBOT ASSITED LAPAROSCOPIC NEPHROURETERECTOMY Right 06/20/2016   Procedure: XI ROBOT ASSITED LAPAROSCOPIC NEPHROURETERECTOMY extensive adhesiolysis;  Surgeon: Ricardo Likens, MD;  Location: [facility];  Service: Urology;  Laterality: Right;   TRIGGER FINGER RELEASE Left 01/10/2021   Procedure: RELEASE TRIGGER FINGER/A-1 PULLEY LEFT INDEX TRIGGER RLEASE AND INCISION ANNULAR LIGAMENT CYST;  Surgeon: Murrell Drivers, MD;  Location: [facility];  Service: Orthopedics;  Laterality: Left;  Bier block   VASECTOMY     Social History:  reports that he quit smoking about 51  years ago. His smoking use included cigarettes. He has never used smokeless tobacco. He reports that he does not drink alcohol  and does not use drugs.  Allergies[1]  Family History  Problem Relation Age of Onset   Seizures Mother    Lung cancer Mother    Heart disease Father    Stroke Father     Prior to Admission medications  Medication Sig Start Date End Date Taking? Authorizing Provider  acetaminophen  (TYLENOL ) 500 MG tablet Take 1,000 mg by mouth every 6 (six) hours as needed for moderate pain.    [provider]  amitriptyline  (ELAVIL ) 10 MG tablet Take 10 mg by mouth at bedtime.    [provider]  aspirin  EC 81 MG tablet Take 81 mg by mouth daily.    [provider]  calcium  carbonate (TUMS - DOSED IN MG ELEMENTAL CALCIUM ) 500 MG chewable tablet Chew 1 tablet by mouth daily as needed for indigestion or heartburn.    [provider]  cetirizine (ZYRTEC) 10 MG tablet Take 10 mg by mouth daily.    [provider]  Cholecalciferol  (VITAMIN D3) 50 MCG (2000 UT) TABS Take 2,000 Units by mouth daily.    [provider]  clonazePAM  (KLONOPIN ) 1 MG tablet Take 1 mg by mouth at bedtime.    [provider]  diclofenac Sodium (VOLTAREN) 1 % GEL Apply 1 application topically 4 (four) times daily as needed (pain).    [provider]  FLUoxetine  (PROZAC ) 20 MG capsule Take 20 mg by mouth daily.    [provider]  metFORMIN (GLUCOPHAGE-XR) 500 MG 24 hr tablet Take 1,000 mg by mouth every morning.    [provider]  Omega-3 Fatty Acids (FISH OIL) 1000 MG CAPS Take 1,000 mg by mouth daily.    [provider]  pantoprazole  (PROTONIX ) 40 MG tablet Take 80 mg by mouth every morning.     [provider]  pregabalin  (LYRICA ) 75 MG capsule Take 75-150 mg by mouth See admin instructions. 75 mg in the morning and 150 mg in the evening    [provider]  simvastatin  (ZOCOR ) 20 MG tablet Take  20 mg by mouth at bedtime.    [provider]  SUMAtriptan (IMITREX) 100 MG tablet Take 100 mg by mouth daily as needed for migraine. May repeat in 2 hours if headache persists or recurs.    [provider]  timolol  (BETIMOL ) 0.5 % ophthalmic solution Place 1 drop into both eyes daily.    [provider]    Physical Exam: Vitals:   07/02/24 0830 07/02/24 0845 07/02/24 0915 07/02/24 1000  BP: 119/71 106/72 103/73   Pulse: 100 88 91 88  Resp: (!) 21 (!) 26 (!) 24 (!) 27  Temp:      TempSrc:      SpO2:  95% 95% 95% 95%  Weight:      Height:       GENERAL:  A&O x 2, NAD, well developed, cooperative, follows commands HEENT: [place]/AT, No thrush, No icterus, No oral ulcers Neck:  No neck mass, No meningismus, soft, supple CV: RRR, no S3, no S4, no rub, no JVD Lungs:  bilateral rales.  Scattered bilateral wheeze. Abd: soft/NT +BS, nondistended Ext: tace LE edema, no lymphangitis, no cyanosis, no rashes Neuro:  CN II-XII intact, strength 4/5 in RUE, RLE, strength 4/5 LUE, LLE; sensation intact bilateral; no dysmetria; babinski equivocal  Data Reviewed: Data reviewed above in history  Assessment and Plan: Sepsis  - Present on admission - Presented with fever and tachycardia - Secondary to influenza - UA negative for pyuria - Follow blood cultures - Chest x-ray with bilateral interstitial opacities  Pulmonary infiltrates - Likely secondary to influenza - Check PCT - Check proBNP  Influenza pneumonitis - Continue oseltamavir - Start bronchodilators  CKD stage IIIb - Baseline creatinine 1.6-1.8  Thrombocytopenia - Secondary to infectious process - TSH - B12 - Folic acid   Lactic acidosis - Continue IV fluids  Generalized weakness - PT evaluation - Workup as above  Diabetes mellitus type 2 - Holding metformin - Check hemoglobin A1c - NovoLog  sliding scale  Mixed hyperlipidemia - Continue statin  Depression/anxiety - Continue fluoxetine   and clonazepam     Advance Care Planning: FULL  Consults: none  Family Communication: wife 12/27  Severity of Illness: The appropriate patient status for this patient is INPATIENT. Inpatient status is judged to be reasonable and necessary in order to provide the required intensity of service to ensure the patient's safety. The patient's presenting symptoms, physical exam findings, and initial radiographic and laboratory data in the context of their chronic comorbidities is felt to place them at high risk for further clinical deterioration. Furthermore, it is not anticipated that the patient will be medically stable for discharge from the hospital within 2 midnights of admission.   * I certify that at the point of admission it is my clinical judgment that the patient will require inpatient hospital care spanning beyond 2 midnights from the point of admission due to high intensity of service, high risk for further deterioration and high frequency of surveillance required.*  Author: Alm Schneider, MD 07/02/2024 10:19 AM  For on call review [email or web address].      [1] No Known Allergies  "

## 2024-07-02 NOTE — ED Triage Notes (Signed)
 Pt from home brought in by [facility]. Pt states he fell but the family states they assisted him to floor. Pt was on floor at time of EMS arrival. Pt states he was trying to go shave when he fell. He states he did hit he head but family denies this. Wife says he felt warm to touch and she gave tylenol  around 0330. Family says he is confused at baseline but seems more confused than usual he has also been weak for the past month. En-route to hospital pt satting low 90's was placed on 3L O2.

## 2024-07-02 NOTE — ED Notes (Signed)
 Pt given ice water  and NS informed of new diet order.

## 2024-07-02 NOTE — ED Provider Notes (Signed)
 " [place] EMERGENCY DEPARTMENT AT [facility] Provider Note   CSN: [id number] Arrival date & time: 07/02/24  9579     Patient presents with: Weakness   Chase Malone is a 78 y.o. male.  {Add pertinent medical, surgical, social history, OB history to HPI:6865} 78 year old male with a history of diabetes, hyperlipidemia, chronic kidney disease who presents ER today secondary to generalized weakness.  Varying reports.  Patient had an episode of weakness tonight that caused him to go to the floor.  Saw that the family helped him to the floor but the patient thinks he fell and hit his head lightly.  Was tachypneic with EMS was started on oxygen but not hypoxic.  Patient states he has been coughing for the last week has been productive of a clear sputum but nothing green or worsening.  No nausea or vomiting.  No other sick contacts.   Weakness      Prior to Admission medications  Medication Sig Start Date End Date Taking? Authorizing Provider  acetaminophen  (TYLENOL ) 500 MG tablet Take 1,000 mg by mouth every 6 (six) hours as needed for moderate pain.    [provider]  amitriptyline  (ELAVIL ) 10 MG tablet Take 10 mg by mouth at bedtime.    [provider]  aspirin  EC 81 MG tablet Take 81 mg by mouth daily.    [provider]  calcium  carbonate (TUMS - DOSED IN MG ELEMENTAL CALCIUM ) 500 MG chewable tablet Chew 1 tablet by mouth daily as needed for indigestion or heartburn.    [provider]  cetirizine (ZYRTEC) 10 MG tablet Take 10 mg by mouth daily.    [provider]  Cholecalciferol  (VITAMIN D3) 50 MCG (2000 UT) TABS Take 2,000 Units by mouth daily.    [provider]  clonazePAM  (KLONOPIN ) 1 MG tablet Take 1 mg by mouth at bedtime.    [provider]  diclofenac Sodium (VOLTAREN) 1 % GEL Apply 1 application topically 4 (four) times daily as needed (pain).    [provider]  FLUoxetine  (PROZAC ) 20 MG  capsule Take 20 mg by mouth daily.    [provider]  metFORMIN (GLUCOPHAGE-XR) 500 MG 24 hr tablet Take 1,000 mg by mouth every morning.    [provider]  Omega-3 Fatty Acids (FISH OIL) 1000 MG CAPS Take 1,000 mg by mouth daily.    [provider]  pantoprazole  (PROTONIX ) 40 MG tablet Take 80 mg by mouth every morning.     [provider]  pregabalin  (LYRICA ) 75 MG capsule Take 75-150 mg by mouth See admin instructions. 75 mg in the morning and 150 mg in the evening    [provider]  simvastatin  (ZOCOR ) 20 MG tablet Take 20 mg by mouth at bedtime.    [provider]  SUMAtriptan (IMITREX) 100 MG tablet Take 100 mg by mouth daily as needed for migraine. May repeat in 2 hours if headache persists or recurs.    [provider]  timolol  (BETIMOL ) 0.5 % ophthalmic solution Place 1 drop into both eyes daily.    [provider]    Allergies: Patient has no known allergies.    Review of Systems  Neurological:  Positive for weakness.    Updated Vital Signs BP 108/66   Pulse 98   Temp (!) 100.7 F (38.2 C) (Rectal)   Resp (!) 23   Ht 5' 6 (1.676 m)   SpO2 95%   BMI 29.38 kg/m  Physical Exam Vitals and nursing note reviewed.  Constitutional:      Appearance: He is well-developed.  HENT:     Head: Normocephalic and atraumatic.  Cardiovascular:     Rate and Rhythm: Tachycardia present.  Pulmonary:     Effort: Pulmonary effort is normal. Tachypnea present. No respiratory distress.  Abdominal:     General: There is no distension.  Musculoskeletal:        General: Normal range of motion.     Cervical back: Normal range of motion.  Neurological:     Mental Status: He is alert.     (all labs ordered are listed, but only abnormal results are displayed) Labs Reviewed  CULTURE, BLOOD (ROUTINE X 2)  CULTURE, BLOOD (ROUTINE X 2)  URINE CULTURE  RESP PANEL BY RT-PCR (RSV, FLU A&B, COVID)  [id number]   COMPREHENSIVE METABOLIC PANEL WITH GFR  CBC WITH DIFFERENTIAL/PLATELET  URINALYSIS, ROUTINE W REFLEX MICROSCOPIC  AMMONIA  BLOOD GAS, VENOUS  LACTIC ACID, PLASMA  LACTIC ACID, PLASMA  PROTIME-INR  CBG MONITORING, ED    EKG: None  Radiology: No results found.  {Document cardiac monitor, telemetry assessment procedure when appropriate:32947} Procedures   Medications Ordered in the ED  lactated ringers  bolus 1,000 mL (has no administration in time range)  vancomycin  (VANCOCIN ) 20 mg/kg in sodium chloride  0.9 % 500 mL IVPB (has no administration in time range)  ceFEPIme  (MAXIPIME ) 2 g in sodium chloride  0.9 % 100 mL IVPB (has no administration in time range)  lactated ringers  infusion (has no administration in time range)      {Click here for ABCD2, HEART and other calculators REFRESH Note before signing:1}                              Medical Decision Making Amount and/or Complexity of Data Reviewed Labs: ordered. Radiology: ordered.  Risk Prescription drug management.  Patient tachycardic, tachypneic but hot to touch.  Rectal temperature checked and was 100.7.  Sepsis workup initiated.  Will also evaluate for hyperammonemia, hypercarbia, stroke.  Broad-spectrum antibiotics given after blood cultures. ***  {Document critical care time when appropriate  Document review of labs and clinical decision tools ie CHADS2VASC2, etc  Document your independent review of radiology images and any outside records  Document your discussion with family members, caretakers and with consultants  Document social determinants of health affecting pt's care  Document your decision making why or why not admission, treatments were needed:32947:::1}   Final diagnoses:  None    ED Discharge Orders     None        "

## 2024-07-02 NOTE — ED Provider Notes (Signed)
 Patient signed out to me at 0700 by Dr. Lorette pending repeat lactic.  In short this is an 78 year old male with past medical history of diabetes, hyperlipidemia and CKD that presented to the emergency department with generalized weakness.  He was afebrile on arrival and ill-appearing.  With a past positive for the flu.  He has been undergoing sepsis workup and has an elevated lactate of 3.0.  He was satting in the low 90s on room air and remains on 2 L nasal cannula.  Creatinine slightly increased from his baseline.  Patient is pending a repeat lactic and likely will require admission.  9:03 AM Repeat lactic 3.4, slightly increased. Patient is ill appearing, on 2L [place] in bed. Will be given additional fluids and will need admission.    Kingsley, Nechemia Chiappetta K, DO 07/02/24 [phone]

## 2024-07-02 NOTE — ED Notes (Signed)
 Pt to ct

## 2024-07-02 NOTE — Hospital Course (Addendum)
 78 year old male with a history of diabetes mellitus type 2, hypertension, hyperlipidemia, CKD stage III, mild cognitive impairment, anxiety/depression presenting with at least 1 week history of generalized weakness, coughing, shortness of breath, chest congestion.  He states that his cough is mostly clear sputum.  Wife is at the bedside to supplement history.  The patient was trying to go to the bathroom to shave.  He required the help of his grandson as the patient was weak and unsteady.  Wife states that he has been having difficulty getting out of bed because of increasing generalized weakness.  Subsequently, the patient made his way back to the bedroom when he fell.  Fortunately, the patient's grandson and wife were near him and were able to ease him onto the floor.  The patient was found on the floor by EMS.  He seemed confused.  Patient was noted to have oxygen saturation in the low 90s.  He was placed on 3 L by EMS. Wife states that the patient has had a functional decline over the past 2 months.  He has had decreased oral intake for the past week.  He had 2 falls this past week.  She states that he has not been placed on any new medications.  There is not been any complaints of chest pain, hemoptysis, nausea, vomiting, diarrhea, abdominal pain, dysuria, hematuria.  In the ED, the patient was febrile up to 100.7 F.  He was hemodynamically stable.  Oxygen saturation was 90% on room air.  He was placed on 2 L with oxygen up to 95%.  WBC 6.7, hemoglobin 1.6, platelets 100,000.  Sodium 135, potassium 4.3, bicarbonate 21, serum creatinine 1.89.  AST 37, ALT 15, alk phosphatase 68, total bilirubin 0.9.  Lactic acid 3.0>> 3.4.  VBG showed 7.4 5/34/56/23.  Chest x-ray showed mild diffuse interstitial opacities.  CT brain was negative for any acute findings.  Influenza A was positive.  Ammonia 51.  EKG showed sinus rhythm nonspecific ST changes.  The patient was started on oseltamavir, vancomycin , cefepime .  He  was given 3 L of LR bolus.

## 2024-07-02 NOTE — ED Notes (Signed)
Family in room  

## 2024-07-02 NOTE — Progress Notes (Addendum)
 Responded to nursing call:  red MEWS   Subjective: Pt more sob after getting back to bed from BR.  Denies cp, n/v/d, abd pain  Vitals:   07/02/24 1230 07/02/24 1300 07/02/24 1345 07/02/24 1805  BP: 122/73 129/78 (!) 154/83 (!) 179/101  Pulse: 89 88 94 (!) 113  Resp: (!) 24 (!) 24 (!) 25 (!) 29  Temp:   97.9 F (36.6 C) 98.2 F (36.8 C)  TempSrc:   Oral Oral  SpO2: 97% 98% 93% 94%  Weight:      Height:       CV--RRR Lung--bilateral rhonchi.  Bilateral wheeze Abd--soft+BS/NT Ext-- 1 +LE edema   Assessment/Plan:  Acute resp failure with hypoxia -now appears fluid overloaded -lasix  40 mg IV x 1 -start solumedrol -give prn duoneb -stop IVF -add brovana  and pulmicort  -no increase WOB so can stay on floor for now and monitor response to new orders    Alm Schneider, DO [facility]

## 2024-07-02 NOTE — Sepsis Progress Note (Signed)
 Elink monitoring for the code sepsis protocol.

## 2024-07-02 NOTE — Plan of Care (Signed)
  Problem: Education: Goal: Knowledge of General Education information will improve Description: Including pain rating scale, medication(s)/side effects and non-pharmacologic comfort measures Outcome: Progressing   Problem: Clinical Measurements: Goal: Respiratory complications will improve Outcome: Progressing   Problem: Clinical Measurements: Goal: Diagnostic test results will improve Outcome: Progressing   Problem: Clinical Measurements: Goal: Cardiovascular complication will be avoided Outcome: Progressing   

## 2024-07-02 NOTE — ED Notes (Signed)
 Cultures collected before abx

## 2024-07-02 NOTE — ED Notes (Signed)
 Hospitalist at bedside

## 2024-07-03 ENCOUNTER — Inpatient Hospital Stay (HOSPITAL_COMMUNITY)

## 2024-07-03 DIAGNOSIS — I5031 Acute diastolic (congestive) heart failure: Secondary | ICD-10-CM | POA: Insufficient documentation

## 2024-07-03 DIAGNOSIS — A419 Sepsis, unspecified organism: Secondary | ICD-10-CM | POA: Diagnosis not present

## 2024-07-03 DIAGNOSIS — D696 Thrombocytopenia, unspecified: Secondary | ICD-10-CM | POA: Diagnosis not present

## 2024-07-03 DIAGNOSIS — J9601 Acute respiratory failure with hypoxia: Secondary | ICD-10-CM | POA: Diagnosis not present

## 2024-07-03 LAB — URINE CULTURE: Culture: NO GROWTH

## 2024-07-03 LAB — CBC
HCT: 33.6 % — ABNORMAL LOW (ref 39.0–52.0)
Hemoglobin: 11.1 g/dL — ABNORMAL LOW (ref 13.0–17.0)
MCH: 31.3 pg (ref 26.0–34.0)
MCHC: 33 g/dL (ref 30.0–36.0)
MCV: 94.6 fL (ref 80.0–100.0)
Platelets: 92 K/uL — ABNORMAL LOW (ref 150–400)
RBC: 3.55 MIL/uL — ABNORMAL LOW (ref 4.22–5.81)
RDW: 14.9 % (ref 11.5–15.5)
WBC: 5 K/uL (ref 4.0–10.5)
nRBC: 0 % (ref 0.0–0.2)

## 2024-07-03 LAB — GLUCOSE, CAPILLARY
Glucose-Capillary: 147 mg/dL — ABNORMAL HIGH (ref 70–99)
Glucose-Capillary: 226 mg/dL — ABNORMAL HIGH (ref 70–99)
Glucose-Capillary: 186 mg/dL — ABNORMAL HIGH (ref 70–99)
Glucose-Capillary: 190 mg/dL — ABNORMAL HIGH (ref 70–99)

## 2024-07-03 LAB — BASIC METABOLIC PANEL WITH GFR
Anion gap: 17 — ABNORMAL HIGH (ref 5–15)
BUN: 17 mg/dL (ref 8–23)
CO2: 22 mmol/L (ref 22–32)
Calcium: 8.8 mg/dL — ABNORMAL LOW (ref 8.9–10.3)
Chloride: 100 mmol/L (ref 98–111)
Creatinine, Ser: 1.97 mg/dL — ABNORMAL HIGH (ref 0.61–1.24)
GFR, Estimated: 34 mL/min — ABNORMAL LOW
Glucose, Bld: 168 mg/dL — ABNORMAL HIGH (ref 70–99)
Potassium: 3.9 mmol/L (ref 3.5–5.1)
Sodium: 139 mmol/L (ref 135–145)

## 2024-07-03 LAB — MAGNESIUM: Magnesium: 2 mg/dL (ref 1.7–2.4)

## 2024-07-03 MED ORDER — FUROSEMIDE 10 MG/ML IJ SOLN: 40.0000 mg | Freq: Once | INTRAMUSCULAR | Status: DC

## 2024-07-03 MED ORDER — OSELTAMIVIR PHOSPHATE 30 MG PO CAPS
30.0000 mg | ORAL_CAPSULE | Freq: Every day | ORAL | Status: AC
  Administered 2024-07-04 – 2024-07-06 (×3): 30 mg via ORAL

## 2024-07-03 MED ORDER — FUROSEMIDE 10 MG/ML IJ SOLN
40.0000 mg | Freq: Every day | INTRAMUSCULAR | Status: DC
  Administered 2024-07-03 – 2024-07-08 (×6): 40 mg via INTRAVENOUS
  Filled 2024-07-03: qty 4

## 2024-07-03 MED ORDER — IPRATROPIUM-ALBUTEROL 0.5-2.5 (3) MG/3ML IN SOLN
3.0000 mL | Freq: Three times a day (TID) | RESPIRATORY_TRACT | Status: DC
  Administered 2024-07-03 – 2024-07-04 (×5): 3 mL via RESPIRATORY_TRACT
  Filled 2024-07-03 (×3): qty 3

## 2024-07-03 NOTE — Progress Notes (Signed)
 RE: Chase Malone DOB: [birth year] Date: 07/03/2024  MUST ID: [id number]  To Whom It May Concern:   Please be advised that the above name patient will require a short-term nursing home stay- anticipated 30 days or less rehabilitation and strengthening. The plan is for return home.

## 2024-07-03 NOTE — TOC Initial Note (Signed)
 Transition of Care [facility]) - Initial/Assessment Note    Patient Details  Name: Chase Malone MRN: [id number] Date of Birth: 21-Jul-1945  [facility] CM/SW Contact:    Lucie Lunger, LCSWA Phone Number: 07/03/2024, 2:00 PM  Clinical Narrative:                 CSW spoke with pt and brother at bedside to complete assessment. Pt states he is from home with his wife. He is independent in completing his ADLs. Pt has a walker in the home but does not always use it. Pt is agreeable to SNF at this time. Referral to be sent out for review. TOC to follow.   Expected Discharge Plan: Skilled Nursing Facility Barriers to Discharge: Continued Medical Work up   Patient Goals and CMS Choice Patient states their goals for this hospitalization and ongoing recovery are:: get stronger CMS Medicare.gov Compare Post Acute Care list provided to:: Patient Choice offered to / list presented to : Patient [place] [facility]provided to:: Patient    Expected Discharge Plan and Services In-house Referral: Clinical Social Work Discharge Planning Services: CM Consult Post Acute Care Choice: Skilled Nursing Facility Living arrangements for the past 2 months: Single Family Home                                      Prior Living Arrangements/Services Living arrangements for the past 2 months: Single Family Home Lives with:: Spouse Patient language and need for interpreter reviewed:: Yes Do you feel safe going back to the place where you live?: Yes      Need for Family Participation in Patient Care: Yes (Comment) Care giver support system in place?: Yes (comment)   Criminal Activity/Legal Involvement Pertinent to Current Situation/Hospitalization: No - Comment as needed  Activities of Daily Living   ADL Screening (condition at time of admission) Independently performs ADLs?: Yes (appropriate for developmental age) Is the patient deaf or have  difficulty hearing?: Yes Does the patient have difficulty seeing, even when wearing glasses/contacts?: No Does the patient have difficulty concentrating, remembering, or making decisions?: Yes  Permission Sought/Granted                  Emotional Assessment Appearance:: Appears stated age Attitude/Demeanor/Rapport: Engaged Affect (typically observed): Accepting Orientation: : Oriented to Self, Oriented to Place, Oriented to Situation Alcohol  / Substance Use: Not Applicable Psych Involvement: No (comment)  Admission diagnosis:  Lactic acidosis [E87.20] Influenza A [J10.1] Sepsis due to undetermined organism [facility]) [A41.9] Patient Active Problem List   Diagnosis Date Noted   Sepsis due to undetermined organism (HCC) 07/02/2024   Acute respiratory failure with hypoxia (HCC) 07/02/2024   Chronic kidney disease, stage 3b (HCC) 07/02/2024   Lactic acidosis 07/02/2024   Thrombocytopenia 07/02/2024   Influenza A 07/02/2024   Atrophic kidney, 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 [place] [address] Phone: [phone] Fax: [phone]     Social Drivers of Health (SDOH) Social History: SDOH Screenings   Food Insecurity: No Food Insecurity (07/02/2024)  Housing: Low Risk (07/02/2024)  Transportation Needs: No Transportation Needs (07/02/2024)  Utilities: Not At Risk (07/02/2024)  Social Connections: Moderately Isolated (07/02/2024)   SDOH Interventions:     Readmission Risk Interventions    07/03/2024  1:59 PM  Readmission Risk Prevention Plan  Transportation Screening Complete  Home Care Screening Complete  Medication Review (RN CM) Complete

## 2024-07-03 NOTE — Progress Notes (Signed)
 "          PROGRESS NOTE  Chase Malone FMW:[id number] DOB: 19-Dec-1945 DOA: 07/02/2024 PCP: [facility]  Brief History:  78 year old male with a history of diabetes mellitus type 2, hypertension, hyperlipidemia, CKD stage III, mild cognitive impairment, anxiety/depression presenting with at least 1 week history of generalized weakness, coughing, shortness of breath, chest congestion.  He states that his cough is mostly clear sputum.  Wife is at the bedside to supplement history.  The patient was trying to go to the bathroom to shave.  He required the help of his grandson as the patient was weak and unsteady.  Wife states that he has been having difficulty getting out of bed because of increasing generalized weakness.  Subsequently, the patient made his way back to the bedroom when he fell.  Fortunately, the patient's grandson and wife were near him and were able to ease him onto the floor.  The patient was found on the floor by EMS.  He seemed confused.  Patient was noted to have oxygen saturation in the low 90s.  He was placed on 3 L by EMS. Wife states that the patient has had a functional decline over the past 2 months.  He has had decreased oral intake for the past week.  He had 2 falls this past week.  She states that he has not been placed on any new medications.  There is not been any complaints of chest pain, hemoptysis, nausea, vomiting, diarrhea, abdominal pain, dysuria, hematuria.  In the ED, the patient was febrile up to 100.7 F.  He was hemodynamically stable.  Oxygen saturation was 90% on room air.  He was placed on 2 L with oxygen up to 95%.  WBC 6.7, hemoglobin 1.6, platelets 100,000.  Sodium 135, potassium 4.3, bicarbonate 21, serum creatinine 1.89.  AST 37, ALT 15, alk phosphatase 68, total bilirubin 0.9.  Lactic acid 3.0>> 3.4.  VBG showed 7.4 5/34/56/23.  Chest x-ray showed mild diffuse interstitial opacities.  CT brain was negative for any acute findings.  Influenza A  was positive.  Ammonia 51.  EKG showed sinus rhythm nonspecific ST changes.  The patient was started on oseltamavir, vancomycin , cefepime .  He was given 3 L of LR bolus.   Assessment/Plan: Sepsis  - Present on admission - Presented with fever and tachycardia - Secondary to influenza - UA negative for pyuria - Follow blood cultures--neg to date - Chest x-ray with bilateral interstitial opacities  Acute diastolic HF -start daily weight -continue IV lasix  daily -accurate I/Os -Echo   Pulmonary infiltrates - Likely secondary to influenza - Check PCT 0.40 - continue empiric ceftriaxone /azithro   Influenza pneumonitis - Continue oseltamavir - Continue bronchodilators - add solumedrol for bronchospasms   CKD stage IIIb - Baseline creatinine 1.6-1.8   Thrombocytopenia - Secondary to infectious process - TSH--2.040 - B12--251 - Folic acid --3.5   Lactic acidosis - initially on IV fluids>>stopped with fluid overload   Generalized weakness - PT evaluation>>SNF - Workup as above   Diabetes mellitus type 2 - Holding metformin - Check hemoglobin A1c 6.1 - NovoLog  sliding scale   Mixed hyperlipidemia - Continue statin   Depression/anxiety - Continue fluoxetine  and clonazepam         Family Communication:   Brother at bedside 12/28  Consultants:  none  Code Status:  FULL   DVT Prophylaxis:  SCDs   Procedures: As Listed in Progress Note Above  Antibiotics: Ceftriaxone  12/27>> Azithro 12/27>>    Subjective:  Patient denies fevers, chills, headache, chest pain,nausea, vomiting, diarrhea, abdominal pain, dysuria, hematuria, hematochezia, and melena. Sob is a little better.  Has dry cough   Objective: Vitals:   07/03/24 0648 07/03/24 0649 07/03/24 0650 07/03/24 1302  BP:    110/66  Pulse:    82  Resp:    18  Temp:    98.2 F (36.8 C)  TempSrc:      SpO2: 98% 98% 98% 94%  Weight:      Height:        Intake/Output Summary (Last 24 hours) at  07/03/2024 1356 Last data filed at 07/03/2024 0500 Gross per 24 hour  Intake 1394.21 ml  Output 1100 ml  Net 294.21 ml   Weight change:  Exam:  General:  Pt is alert, follows commands appropriately, not in acute distress HEENT: No icterus, No thrush, No neck mass, [place]/AT Cardiovascular: RRR, S1/S2, no rubs, no gallops Respiratory: bilateral crackles.  Bilateral wheeze Abdomen: Soft/+BS, non tender, non distended, no guarding Extremities:1+LE edema, No lymphangitis, No petechiae, No rashes, no synovitis   Data Reviewed: I have personally reviewed following labs and imaging studies Basic Metabolic Panel: Recent Labs  Lab 07/02/24 0513 07/03/24 0437  NA 135 139  K 4.3 3.9  CL 101 100  CO2 21* 22  GLUCOSE 155* 168*  BUN 13 17  CREATININE 1.89* 1.97*  CALCIUM  8.9 8.8*  MG  --  2.0   Liver Function Tests: Recent Labs  Lab 07/02/24 0513  AST 37  ALT 15  ALKPHOS 68  BILITOT 0.9  PROT 6.1*  ALBUMIN  3.5   No results for input(s): LIPASE, AMYLASE in the last 168 hours. Recent Labs  Lab 07/02/24 0534  AMMONIA 51*   Coagulation Profile: Recent Labs  Lab 07/02/24 0513  INR 1.2   CBC: Recent Labs  Lab 07/02/24 0513 07/03/24 0437  WBC 6.7 5.0  NEUTROABS 5.6  --   HGB 11.6* 11.1*  HCT 35.5* 33.6*  MCV 94.4 94.6  PLT 100* 92*   Cardiac Enzymes: No results for input(s): CKTOTAL, CKMB, CKMBINDEX, TROPONINI in the last 168 hours. BNP: Invalid input(s): POCBNP CBG: Recent Labs  Lab 07/02/24 0609 07/02/24 1620 07/02/24 1920 07/03/24 0734 07/03/24 1127  GLUCAP 158* 133* 177* 147* 226*   HbA1C: Recent Labs    07/02/24 1525  HGBA1C 6.1*   Urine analysis:    Component Value Date/Time   COLORURINE YELLOW 07/02/2024 0541   APPEARANCEUR CLEAR 07/02/2024 0541   APPEARANCEUR Clear 05/09/2021 1023   LABSPEC 1.019 07/02/2024 0541   PHURINE 5.0 07/02/2024 0541   GLUCOSEU NEGATIVE 07/02/2024 0541   HGBUR MODERATE (A) 07/02/2024 0541    BILIRUBINUR NEGATIVE 07/02/2024 0541   BILIRUBINUR Negative 05/09/2021 1023   KETONESUR 5 (A) 07/02/2024 0541   PROTEINUR 30 (A) 07/02/2024 0541   NITRITE NEGATIVE 07/02/2024 0541   LEUKOCYTESUR NEGATIVE 07/02/2024 0541   Sepsis Labs: @LABRCNTIP (procalcitonin:4,lacticidven:4) ) Recent Results (from the past 240 hours)  Blood culture (routine x 2)     Status: None (Preliminary result)   Collection Time: 07/02/24  5:13 AM   Specimen: BLOOD RIGHT FOREARM  Result Value Ref Range Status   Specimen Description BLOOD RIGHT FOREARM  Final   Special Requests   Final    BOTTLES DRAWN AEROBIC AND ANAEROBIC Blood Culture results may not be optimal due to an inadequate volume of blood received in culture bottles   Culture   Final    NO GROWTH 1 DAY Performed at [facility]  [facility], [address], [place], [place] [address]    Report Status PENDING  Incomplete  Blood culture (routine x 2)     Status: None (Preliminary result)   Collection Time: 07/02/24  5:13 AM   Specimen: Left Antecubital; Blood  Result Value Ref Range Status   Specimen Description LEFT ANTECUBITAL  Final   Special Requests   Final    BOTTLES DRAWN AEROBIC AND ANAEROBIC Blood Culture results may not be optimal due to an inadequate volume of blood received in culture bottles   Culture   Final    NO GROWTH 1 DAY Performed at [facility], [address] [place], [place] [address]    Report Status PENDING  Incomplete  Resp panel by RT-PCR (RSV, Flu A&B, Covid) Anterior Nasal Swab     Status: Abnormal   Collection Time: 07/02/24  5:13 AM   Specimen: Anterior Nasal Swab  Result Value Ref Range Status   SARS Coronavirus 2 by RT PCR NEGATIVE NEGATIVE Final    Comment: (NOTE) SARS-CoV-2 target nucleic acids are NOT DETECTED.  The SARS-CoV-2 RNA is generally detectable in upper respiratory specimens during the acute phase of infection. The lowest concentration of SARS-CoV-2 viral copies this assay can detect is 138 copies/mL. A  negative result does not preclude SARS-Cov-2 infection and should not be used as the sole basis for treatment or other patient management decisions. A negative result may occur with  improper specimen collection/handling, submission of specimen other than nasopharyngeal swab, presence of viral mutation(s) within the areas targeted by this assay, and inadequate number of viral copies(<138 copies/mL). A negative result must be combined with clinical observations, patient history, and epidemiological information. The expected result is Negative.  Fact Sheet for Patients:  bloggercourse.com  Fact Sheet for Healthcare Providers:  seriousbroker.it  This test is no t yet approved or cleared by the [place]  FDA and  has been authorized for detection and/or diagnosis of SARS-CoV-2 by FDA under an Emergency Use Authorization (EUA). This EUA will remain  in effect (meaning this test can be used) for the duration of the COVID-19 declaration under Section 564(b)(1) of the Act, 21 U.S.C.section 360bbb-3(b)(1), unless the authorization is terminated  or revoked sooner.       Influenza A by PCR POSITIVE (A) NEGATIVE Final   Influenza B by PCR NEGATIVE NEGATIVE Final    Comment: (NOTE) The Xpert Xpress SARS-CoV-2/FLU/RSV plus assay is intended as an aid in the diagnosis of influenza from Nasopharyngeal swab specimens and should not be used as a sole basis for treatment. Nasal washings and aspirates are unacceptable for Xpert Xpress SARS-CoV-2/FLU/RSV testing.  Fact Sheet for Patients: bloggercourse.com  Fact Sheet for Healthcare Providers: seriousbroker.it  This test is not yet approved or cleared by the [place]  FDA and has been authorized for detection and/or diagnosis of SARS-CoV-2 by FDA under an Emergency Use Authorization (EUA). This EUA will remain in effect (meaning this test  can be used) for the duration of the COVID-19 declaration under Section 564(b)(1) of the Act, 21 U.S.C. section 360bbb-3(b)(1), unless the authorization is terminated or revoked.     Resp Syncytial Virus by PCR NEGATIVE NEGATIVE Final    Comment: (NOTE) Fact Sheet for Patients: bloggercourse.com  Fact Sheet for Healthcare Providers: seriousbroker.it  This test is not yet approved or cleared by the [place]  FDA and has been authorized for detection and/or diagnosis of SARS-CoV-2 by FDA under an Emergency Use Authorization (EUA). This EUA will remain in effect (meaning  this test can be used) for the duration of the COVID-19 declaration under Section 564(b)(1) of the Act, 21 U.S.C. section 360bbb-3(b)(1), unless the authorization is terminated or revoked.  Performed at [facility], [address], [place], [place] [address]   Urine Culture     Status: None   Collection Time: 07/02/24  5:41 AM   Specimen: Urine, Catheterized  Result Value Ref Range Status   Specimen Description   Final    URINE, CATHETERIZED Performed at [facility], [address], [place], [place] [address]    Special Requests   Final    NONE Performed at [facility], [address] [place], [place] [address]    Culture   Final    NO GROWTH Performed at [facility], [address], [place], [place] [address]    Report Status 07/03/2024 FINAL  Final     Scheduled Meds:  amitriptyline   10 mg Oral QHS   arformoterol   15 mcg Nebulization BID   aspirin  EC  81 mg Oral Daily   azithromycin   500 mg Oral Daily   budesonide  (PULMICORT ) nebulizer solution  0.5 mg Nebulization BID   cholecalciferol   2,000 Units Oral q morning   clonazePAM   1 mg Oral QHS   FLUoxetine   20 mg Oral q morning   furosemide   40 mg Intravenous Once   insulin  aspart  0-5 Units Subcutaneous QHS   insulin  aspart  0-9 Units Subcutaneous TID WC   ipratropium-albuterol   3 mL  Nebulization TID   loratadine   10 mg Oral Daily   methylPREDNISolone  (SOLU-MEDROL ) injection  60 mg Intravenous Q12H   oseltamivir   30 mg Oral BID   pantoprazole   80 mg Oral BH-q7a   pregabalin   75 mg Oral Daily   And   pregabalin   150 mg Oral QHS   simvastatin   20 mg Oral QHS   timolol   1 drop Both Eyes q morning   Continuous Infusions:  cefTRIAXone  (ROCEPHIN )  IV 200 mL/hr at 07/03/24 0500    Procedures/Studies: CT HEAD WO CONTRAST Result Date: 07/02/2024 EXAM: CT HEAD WITHOUT CONTRAST 07/02/2024 05:58:10 AM TECHNIQUE: CT of the head was performed without the administration of intravenous contrast. Automated exposure control, iterative reconstruction, and/or weight based adjustment of the mA/kV was utilized to reduce the radiation dose to as low as reasonably achievable. COMPARISON: Head CT 06/15/2010. CLINICAL HISTORY: 78 year old male. Fall at home. Increased confusion from baseline. FINDINGS: BRAIN AND VENTRICLES: Background brain volume remains normal for age. 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 Mild heterogeneity elsewhere in the bilateral basal ganglia (right caudate series 2 image 35 which also appears likely to be chronic). Gray white differentiation otherwise normal for age. No acute hemorrhage. No evidence of acute infarct. No hydrocephalus. No extra-axial collection. No mass effect or midline shift. Calcified atherosclerosis at the skull base. No suspicious intracranial vascular hyperdensity. ORBITS: Postoperative changes to both globes since 2011. No acute orbit injury identified. SINUSES: Trace low density fluid in the right sphenoid sinus appears inflammatory, while other visible paranasal sinuses, tympanic cavities and mastoids appear clear. SOFT TISSUES AND SKULL: No acute scalp soft tissue injury identified. No skull fracture. IMPRESSION: 1. 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  2. No acute intracranial abnormality.  No acute traumatic injury identified. Electronically signed by: Helayne Hurst MD 07/02/2024 06:04 AM EST RP Workstation: [id number]   DG Chest Port 1 View Result  Date: 07/02/2024 EXAM: 1 VIEW(S) XRAY OF THE CHEST 07/02/2024 05:27:54 AM COMPARISON: Portable chest x ray 06/14/2010. CLINICAL HISTORY: 78 year old male with questionable sepsis, evaluate for abnormality. FINDINGS: LUNGS AND PLEURA: Mildly lower lung volumes. Mild diffuse, symmetric increased pulmonary interstitial markings. No consolidation. No convincing confluent lung opacity. No pleural effusion. No pneumothorax. HEART AND MEDIASTINUM: Mediastinal contours remain normal. Negative visible trachea. BONES AND SOFT TISSUES: No acute osseous abnormality. VISIBLE ABDOMEN: Paucity of bowel gas in the visible abdomen. Cholecystectomy clips suspected. IMPRESSION: 1. Mild diffuse interstitial opacity. Favor vascular congestion / interstitial edema, but viral/atypical respiratory infection could appear similar. Electronically signed by: Helayne Hurst MD 07/02/2024 05:31 AM EST RP Workstation: [id number]    Alm Schneider, DO  [facility]  If 7PM-7AM, please contact night-coverage [email or web address] Password [email or web address] 07/03/2024, 1:56 PM   LOS: 1 day   "

## 2024-07-03 NOTE — Plan of Care (Signed)
" °  Problem: Acute Rehab PT Goals(only PT should resolve) Goal: Pt Will Go Supine/Side To Sit Outcome: Progressing Flowsheets (Taken 07/03/2024 1230) Pt will go Supine/Side to Sit: with minimal assist Goal: Patient Will Transfer Sit To/From Stand Outcome: Progressing Flowsheets (Taken 07/03/2024 1230) Patient will transfer sit to/from stand: with supervision Goal: Pt Will Transfer Bed To Chair/Chair To Bed Outcome: Progressing Flowsheets (Taken 07/03/2024 1230) Pt will Transfer Bed to Chair/Chair to Bed: with supervision Goal: Pt Will Ambulate Outcome: Progressing Flowsheets (Taken 07/03/2024 1230) Pt will Ambulate:  15 feet  with least restrictive assistive device  with supervision   12:31 PM, 07/03/2024 Rosaria Settler, PT, DPT [place] with [facility]  "

## 2024-07-03 NOTE — [place] FL2 (Signed)
 " [place]  MEDICAID FL2 LEVEL OF CARE FORM     IDENTIFICATION  Patient Name: Chase Malone Birthdate: 01/08/1946 Sex: male Admission Date (Current Location): 07/02/2024  [facility] and [place] Number:  [employer] and Address:  [facility],  [address] [place] [address]      Provider Number: [id number]  Attending Physician Name and Address:  Evonnie Lenis, MD  Relative Name and Phone Number:       Current Level of Care: Hospital Recommended Level of Care: Skilled Nursing Facility Prior Approval Number:    Date Approved/Denied:   PASRR Number:    Discharge Plan: SNF    Current Diagnoses: Patient Active Problem List   Diagnosis Date Noted   Sepsis due to undetermined organism (HCC) 07/02/2024   Acute respiratory failure with hypoxia (HCC) 07/02/2024   Chronic kidney disease, stage 3b (HCC) 07/02/2024   Lactic acidosis 07/02/2024   Thrombocytopenia 07/02/2024   Influenza A 07/02/2024   Atrophic kidney, acquired 06/20/2016    Orientation RESPIRATION BLADDER Height & Weight     Self, Situation, Place  Normal Incontinent Weight: 182 lb 1.6 oz (82.6 kg) Height:  5' 6 (167.6 cm)  BEHAVIORAL SYMPTOMS/MOOD NEUROLOGICAL BOWEL NUTRITION STATUS      Incontinent Diet (Carb modified)  AMBULATORY STATUS COMMUNICATION OF NEEDS Skin   Extensive Assist Verbally Normal                       Personal Care Assistance Level of Assistance  Bathing, Feeding, Dressing Bathing Assistance: Limited assistance Feeding assistance: Independent Dressing Assistance: Limited assistance     Functional Limitations Info  Sight, Hearing, Speech Sight Info: Impaired Hearing Info: Impaired Speech Info: Adequate    SPECIAL CARE FACTORS FREQUENCY  PT (By licensed PT), OT (By licensed OT)     PT Frequency: 5 times weekly OT Frequency: 5 times weekly            Contractures Contractures Info: Not present    Additional Factors Info  Code Status,  Allergies Code Status Info: FULL Allergies Info: NKA           Current Medications (07/03/2024):  This is the current hospital active medication list Current Facility-Administered Medications  Medication Dose Route Frequency Provider Last Rate Last Admin   acetaminophen  (TYLENOL ) tablet 650 mg  650 mg Oral Q6H PRN Tat, Lenis, MD       Or   acetaminophen  (TYLENOL ) suppository 650 mg  650 mg Rectal Q6H PRN Tat, Lenis, MD       amitriptyline  (ELAVIL ) tablet 10 mg  10 mg Oral QHS Tat, David, MD   10 mg at 07/02/24 2102   arformoterol  (BROVANA ) nebulizer solution 15 mcg  15 mcg Nebulization BID Evonnie Lenis, MD   15 mcg at 07/03/24 9350   aspirin  EC tablet 81 mg  81 mg Oral Daily Tat, Lenis, MD   81 mg at 07/03/24 9195   azithromycin  (ZITHROMAX ) tablet 500 mg  500 mg Oral Daily Tat, Lenis, MD   500 mg at 07/03/24 9196   budesonide  (PULMICORT ) nebulizer solution 0.5 mg  0.5 mg Nebulization BID Tat, Lenis, MD   0.5 mg at 07/03/24 9351   calcium  carbonate (TUMS - dosed in mg elemental calcium ) chewable tablet 200 mg of elemental calcium   1 tablet Oral Daily PRN Tat, Lenis, MD       cefTRIAXone  (ROCEPHIN ) 1 g in sodium chloride  0.9 % 100 mL IVPB  1 g Intravenous  Q24H Evonnie Lenis, MD 200 mL/hr at 07/03/24 0500 Infusion Verify at 07/03/24 0500   cholecalciferol  (VITAMIN D3) 25 MCG (1000 UNIT) tablet 2,000 Units  2,000 Units Oral q morning Tat, David, MD   2,000 Units at 07/03/24 0803   clonazePAM  (KLONOPIN ) tablet 1 mg  1 mg Oral QHS Tat, David, MD   1 mg at 07/02/24 2102   FLUoxetine  (PROZAC ) capsule 20 mg  20 mg Oral q morning Tat, David, MD   20 mg at 07/03/24 9196   furosemide  (LASIX ) injection 40 mg  40 mg Intravenous Daily Tat, Lenis, MD       insulin  aspart (novoLOG ) injection 0-5 Units  0-5 Units Subcutaneous QHS Tat, Lenis, MD       insulin  aspart (novoLOG ) injection 0-9 Units  0-9 Units Subcutaneous TID WC Tat, Lenis, MD   3 Units at 07/03/24 1300   ipratropium-albuterol  (DUONEB) 0.5-2.5 (3)  MG/3ML nebulizer solution 3 mL  3 mL Nebulization Q2H PRN Tat, Lenis, MD       ipratropium-albuterol  (DUONEB) 0.5-2.5 (3) MG/3ML nebulizer solution 3 mL  3 mL Nebulization TID Tat, Lenis, MD   3 mL at 07/03/24 9351   loratadine  (CLARITIN ) tablet 10 mg  10 mg Oral Daily Tat, Lenis, MD   10 mg at 07/03/24 9194   methylPREDNISolone  sodium succinate (SOLU-MEDROL ) 125 mg/2 mL injection 60 mg  60 mg Intravenous Q12H Tat, David, MD   60 mg at 07/03/24 9197   ondansetron  (ZOFRAN ) tablet 4 mg  4 mg Oral Q6H PRN Tat, Lenis, MD       Or   ondansetron  (ZOFRAN ) injection 4 mg  4 mg Intravenous Q6H PRN Tat, Lenis, MD       oseltamivir  (TAMIFLU ) capsule 30 mg  30 mg Oral BID Mesner, Selinda, MD   30 mg at 07/03/24 0805   pantoprazole  (PROTONIX ) EC tablet 80 mg  80 mg Oral [place] Evonnie Lenis, MD   80 mg at 07/03/24 9195   pregabalin  (LYRICA ) capsule 75 mg  75 mg Oral Daily Tat, Lenis, MD   75 mg at 07/03/24 9195   And   pregabalin  (LYRICA ) capsule 150 mg  150 mg Oral QHS TatLenis, MD       simvastatin  (ZOCOR ) tablet 20 mg  20 mg Oral QHS Tat, David, MD   20 mg at 07/02/24 2102   timolol  (TIMOPTIC ) 0.5 % ophthalmic solution 1 drop  1 drop Both Eyes q morning Tat, David, MD   1 drop at 07/03/24 9195     Discharge Medications: Please see discharge summary for a list of discharge medications.  Relevant Imaging Results:  Relevant Lab Results:   Additional Information SSN: [id number], [place]     "

## 2024-07-03 NOTE — Plan of Care (Signed)

## 2024-07-03 NOTE — Evaluation (Signed)
 Physical Therapy Evaluation Patient Details Name: Chase Malone MRN: [id number] DOB: 22-Oct-1945 Today's Date: 07/03/2024  History of Present Illness  Chase Malone is a 78 year old male with a history of diabetes mellitus type 2, hypertension, hyperlipidemia, CKD stage III, mild cognitive impairment, anxiety/depression presenting with at least 1 week history of generalized weakness, coughing, shortness of breath, chest congestion.  He states that his cough is mostly clear sputum.  Wife is at the bedside to supplement history.  The patient was trying to go to the bathroom to shave.  He required the help of his grandson as the patient was weak and unsteady.  Wife states that he has been having difficulty getting out of bed because of increasing generalized weakness.  Subsequently, the patient made his way back to the bedroom when he fell.  Fortunately, the patient's grandson and wife were near him and were able to ease him onto the floor.  The patient was found on the floor by EMS.  He seemed confused.  Patient was noted to have oxygen saturation in the low 90s.  He was placed on 3 L by EMS.  Wife states that the patient has had a functional decline over the past 2 months.  He has had decreased oral intake for the past week.  He had 2 falls this past week.  She states that he has not been placed on any new medications.  There is not been any complaints of chest pain, hemoptysis, nausea, vomiting, diarrhea, abdominal pain, dysuria, hematuria.   Clinical Impression  Patient agreeable to PT evaluation. 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 Patient also reports at least 15 falls in last 6 months. This date, pt requires moderate assist with bed mobility, and CGA/min assist with transfers and ambulation with RW.  Patient limited most due to LE  weakness, unsteadiness, and fatigue. Reports no dizziness/lightheadedness throughout session. Pt on RA throughout with SpO2 remaining above appropriate levels. O2 left off at end of session, nursing staff notified. Pt tolerates sitting in chair at EOS, alarm set, call button in reach and all needs met. Patient will benefit from continued skilled physical therapy acutely and in recommended venue in order to address current deficits, decrease fall risk, and improve overall function.        If plan is discharge home, recommend the following: A lot of help with walking and/or transfers;A little help with bathing/dressing/bathroom;Assist for transportation;Assistance with cooking/housework;Help with stairs or ramp for entrance   Can travel by private vehicle        Equipment Recommendations None recommended by PT  Recommendations for Other Services       Functional Status Assessment Patient has had a recent decline in their functional status and demonstrates the ability to make significant improvements in function in a reasonable and predictable amount of time.     Precautions / Restrictions Precautions Precautions: Fall Recall of Precautions/Restrictions: Intact Restrictions Weight Bearing Restrictions Per Provider Order: No      Mobility  Bed Mobility Overal bed mobility: Needs Assistance Bed Mobility: Supine to Sit     Supine to sit: Mod assist, Min assist     General bed mobility comments: HOB flat, slow labored movement, use of railings, assisted with trunk elevation and scotting bottom towards EOB once sitting up    Transfers Overall transfer level:  Needs assistance Equipment used: Rolling walker (2 wheels) Transfers: Sit to/from Stand, Bed to chair/wheelchair/BSC Sit to Stand: Min assist, Contact guard assist   Step pivot transfers: Contact guard assist       General transfer comment: min/CGA with STS from bed with RW due to LE weakness, slow labored movement,  requires multiple attempts for stand when attempting independently, short steps during transfer with flexed trunk, generally unsteady    Ambulation/Gait Ambulation/Gait assistance: Contact guard assist, Min assist Gait Distance (Feet): 8 Feet Assistive device: Rolling walker (2 wheels) Gait Pattern/deviations: Step-to pattern, Decreased step length - right, Decreased step length - left, Decreased stride length, Trunk flexed, Narrow base of support Gait velocity: Dec     General Gait Details: Pt ambulates in room at bedside with RW and CGA/min A for safety, demo the above deviations and general unsteadiness, utilizing forward and backward steps to complete, slight posteior lean with backwards steps with pt correcting slightly with tactile cueing, pt reports and limited due to inc fatigue  Stairs            Wheelchair Mobility     Tilt Bed    Modified Rankin (Stroke Patients Only)       Balance Overall balance assessment: Needs assistance Sitting-balance support: Feet supported, Bilateral upper extremity supported Sitting balance-Leahy Scale: Fair Sitting balance - Comments: seated EOB   Standing balance support: During functional activity, Reliant on assistive device for balance, Bilateral upper extremity supported Standing balance-Leahy Scale: Fair Standing balance comment: w/ RW                             Pertinent Vitals/Pain Pain Assessment Pain Assessment: No/denies pain    Home Living Family/patient expects to be discharged to:: Private residence Living Arrangements: Spouse/significant other;Children Available Help at Discharge: Family;Available 24 hours/day Type of Home: House Home Access: Stairs to enter Entrance Stairs-Rails: Can reach both Entrance Stairs-Number of Steps: 4-7   Home Layout: One level Home Equipment: Cane - Programmer, [employer] (2 wheels);Shower seat;Grab bars - tub/shower      Prior Function Prior Level of Function :  Independent/Modified Independent             Mobility Comments: pt reports 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 about a month ago but reports decline in past month with only being able to ambulate short distances in home with QC, reports at least 15 falls in past 6 months ADLs Comments: Reports mostly independent with ADLs besides wife assisting with washing back     Extremity/Trunk Assessment   Upper Extremity Assessment Upper Extremity Assessment: Generalized weakness (Shoulder flexion AROM ~ 150 bilaterally, pt reports increased discomfort in shoulders with PROM past this level, MMT 4/5)    Lower Extremity Assessment Lower Extremity Assessment: Generalized weakness (ankle DF MMT 4+/5 bilaterally, hip flexion MMT 4-/5 bilaterally, reports discomfort in knees with pressure during hip flexion MMT)    Cervical / Trunk Assessment Cervical / Trunk Assessment: Kyphotic  Communication   Communication Communication: No apparent difficulties    Cognition Arousal: Alert Behavior During Therapy: WFL for tasks assessed/performed                             Following commands: Intact       Cueing Cueing Techniques: Verbal cues, Visual cues     General Comments      Exercises  Assessment/Plan    PT Assessment Patient needs continued PT services;All further PT needs can be met in the next venue of care  PT Problem List Decreased strength;Decreased range of motion;Decreased activity tolerance;Decreased balance;Decreased mobility       PT Treatment Interventions DME instruction;Balance training;Gait training;Functional mobility training;Therapeutic activities;Therapeutic exercise;Patient/family education    PT Goals (Current goals can be found in the Care Plan section)  Acute Rehab PT Goals Patient Stated Goal: Return home PT Goal Formulation: With patient Time For Goal Achievement: 07/11/24 Potential to Achieve Goals: Good    Frequency Min  3X/week     Co-evaluation               AM-PAC PT 6 Clicks Mobility  Outcome Measure Help needed turning from your back to your side while in a flat bed without using bedrails?: A Little Help needed moving from lying on your back to sitting on the side of a flat bed without using bedrails?: A Lot Help needed moving to and from a bed to a chair (including a wheelchair)?: A Little Help needed standing up from a chair using your arms (e.g., wheelchair or bedside chair)?: A Little Help needed to walk in hospital room?: A Lot Help needed climbing 3-5 steps with a railing? : A Lot 6 Click Score: 15    End of Session Equipment Utilized During Treatment: Gait belt Activity Tolerance: Patient tolerated treatment well;Patient limited by fatigue Patient left: in chair;with call bell/phone within reach;with chair alarm set Nurse Communication: Mobility status PT Visit Diagnosis: Unsteadiness on feet (R26.81);Other abnormalities of gait and mobility (R26.89);Muscle weakness (generalized) (M62.81);Difficulty in walking, not elsewhere classified (R26.2)    Time: 1010-1036 PT Time Calculation (min) (ACUTE ONLY): 26 min   Charges:   PT Evaluation $PT Eval Moderate Complexity: 1 Mod   PT General Charges $$ ACUTE PT VISIT: 1 Visit         12:29 PM, 07/03/2024 Getsemani Lindon Powell-Butler, PT, DPT [place] with [facility]

## 2024-07-04 ENCOUNTER — Encounter (HOSPITAL_COMMUNITY): Payer: Self-pay | Admitting: Internal Medicine

## 2024-07-04 ENCOUNTER — Inpatient Hospital Stay (HOSPITAL_COMMUNITY)

## 2024-07-04 DIAGNOSIS — I5031 Acute diastolic (congestive) heart failure: Secondary | ICD-10-CM | POA: Diagnosis not present

## 2024-07-04 LAB — GLUCOSE, CAPILLARY
Glucose-Capillary: 158 mg/dL — ABNORMAL HIGH (ref 70–99)
Glucose-Capillary: 263 mg/dL — ABNORMAL HIGH (ref 70–99)
Glucose-Capillary: 198 mg/dL — ABNORMAL HIGH (ref 70–99)
Glucose-Capillary: 267 mg/dL — ABNORMAL HIGH (ref 70–99)
Glucose-Capillary: 270 mg/dL — ABNORMAL HIGH (ref 70–99)

## 2024-07-04 LAB — MAGNESIUM: Magnesium: 2.2 mg/dL (ref 1.7–2.4)

## 2024-07-04 LAB — ECHOCARDIOGRAM COMPLETE
AR max vel: 1.82 cm2
AV Area VTI: 1.91 cm2
AV Area mean vel: 2.07 cm2
AV Mean grad: 6 mmHg
AV Peak grad: 11.8 mmHg
Ao pk vel: 1.72 m/s
Area-P 1/2: 3.91 cm2
Calc EF: 64.7 %
Height: 66 in
S' Lateral: 3.3 cm
Single Plane A2C EF: 65.2 %
Single Plane A4C EF: 64 %
Weight: 2903.02 [oz_av]

## 2024-07-04 LAB — BASIC METABOLIC PANEL WITH GFR
Anion gap: 11 (ref 5–15)
BUN: 26 mg/dL — ABNORMAL HIGH (ref 8–23)
CO2: 27 mmol/L (ref 22–32)
Calcium: 8.6 mg/dL — ABNORMAL LOW (ref 8.9–10.3)
Chloride: 102 mmol/L (ref 98–111)
Creatinine, Ser: 1.91 mg/dL — ABNORMAL HIGH (ref 0.61–1.24)
GFR, Estimated: 35 mL/min — ABNORMAL LOW
Glucose, Bld: 179 mg/dL — ABNORMAL HIGH (ref 70–99)
Potassium: 4.1 mmol/L (ref 3.5–5.1)
Sodium: 139 mmol/L (ref 135–145)

## 2024-07-04 MED ORDER — IPRATROPIUM-ALBUTEROL 0.5-2.5 (3) MG/3ML IN SOLN
3.0000 mL | Freq: Two times a day (BID) | RESPIRATORY_TRACT | Status: AC
  Administered 2024-07-04 – 2024-07-11 (×14): 3 mL via RESPIRATORY_TRACT
  Filled 2024-07-04 (×13): qty 3

## 2024-07-04 MED ORDER — VITAMIN B-12 100 MCG PO TABS
500.0000 ug | ORAL_TABLET | Freq: Every day | ORAL | Status: DC
  Administered 2024-07-05 – 2024-07-11 (×7): 500 ug via ORAL
  Filled 2024-07-04 (×7): qty 5

## 2024-07-04 MED ORDER — FOLIC ACID 1 MG PO TABS
1.0000 mg | ORAL_TABLET | Freq: Every day | ORAL | Status: DC
  Administered 2024-07-04 – 2024-07-11 (×8): 1 mg via ORAL
  Filled 2024-07-04 (×8): qty 1

## 2024-07-04 MED ORDER — CYANOCOBALAMIN 1000 MCG/ML IJ SOLN
1000.0000 ug | Freq: Once | INTRAMUSCULAR | Status: AC
  Administered 2024-07-04: 1000 ug via INTRAMUSCULAR
  Filled 2024-07-04: qty 1

## 2024-07-04 NOTE — Progress Notes (Signed)
 Mobility Specialist Progress Note:    07/04/24 1332  Mobility  Activity Pivoted/transferred from bed to chair  Level of Assistance Moderate assist, patient does 50-74%  Assistive Device Front wheel walker  Distance Ambulated (ft) 3 ft  Range of Motion/Exercises Active;All extremities  Activity Response Tolerated well  Mobility Referral Yes  Mobility visit 1 Mobility  Mobility Specialist Start Time (ACUTE ONLY) 1332  Mobility Specialist Stop Time (ACUTE ONLY) 1352  Mobility Specialist Time Calculation (min) (ACUTE ONLY) 20 min   Pt received supine, agreeable to mobility. Required ModA for supine to sit and MinA to stand and transfer with RW. Tolerated well,asx throughout. Alarm on, call bell in reach. NT in room, all needs met.  Luie Laneve Mobility Specialist Please contact via Special Educational Needs Teacher or  Rehab office at [phone]

## 2024-07-04 NOTE — Progress Notes (Signed)
*  PRELIMINARY RESULTS* Echocardiogram 2D Echocardiogram has been performed.  Chase Malone 07/04/2024, 11:44 AM

## 2024-07-04 NOTE — Plan of Care (Signed)

## 2024-07-04 NOTE — TOC Progression Note (Signed)
 Transition of Care [facility]) - Progression Note    Patient Details  Name: Chase Malone MRN: [id number] Date of Birth: 1946/06/21  [facility] CM/SW Contact  Lucie Lunger, [place] Phone Number: 07/04/2024, 12:57 PM  Clinical Narrative:    CSW spoke with pts spouse to provide update that [facility] is not able to offer a bed for pt at this time. Pts wife requested that referral be sent to [facility] as this is their second choice. CSW awaiting update from Destiny in admissions at [facility] on if they can extend a bed offer. Insurance shara has been started for SNF, bed choice will be added. TOC to follow.   Expected Discharge Plan: Skilled Nursing Facility Barriers to Discharge: Continued Medical Work up               Expected Discharge Plan and Services In-house Referral: Clinical Social Work Discharge Planning Services: CM Consult Post Acute Care Choice: Skilled Nursing Facility Living arrangements for the past 2 months: Single Family Home                                       Social Drivers of Health (SDOH) Interventions SDOH Screenings   Food Insecurity: No Food Insecurity (07/02/2024)  Housing: Low Risk (07/02/2024)  Transportation Needs: No Transportation Needs (07/02/2024)  Utilities: Not At Risk (07/02/2024)  Social Connections: Moderately Isolated (07/02/2024)  Tobacco Use: Medium Risk (07/04/2024)    Readmission Risk Interventions    07/03/2024    1:59 PM  Readmission Risk Prevention Plan  Transportation Screening Complete  Home Care Screening Complete  Medication Review (RN CM) Complete

## 2024-07-05 LAB — BASIC METABOLIC PANEL WITH GFR
Anion gap: 18 — ABNORMAL HIGH (ref 5–15)
BUN: 32 mg/dL — ABNORMAL HIGH (ref 8–23)
CO2: 21 mmol/L — ABNORMAL LOW (ref 22–32)
Calcium: 8.9 mg/dL (ref 8.9–10.3)
Chloride: 100 mmol/L (ref 98–111)
Creatinine, Ser: 1.69 mg/dL — ABNORMAL HIGH (ref 0.61–1.24)
GFR, Estimated: 41 mL/min — ABNORMAL LOW
Glucose, Bld: 198 mg/dL — ABNORMAL HIGH (ref 70–99)
Potassium: 3.6 mmol/L (ref 3.5–5.1)
Sodium: 139 mmol/L (ref 135–145)

## 2024-07-05 LAB — GLUCOSE, CAPILLARY
Glucose-Capillary: 180 mg/dL — ABNORMAL HIGH (ref 70–99)
Glucose-Capillary: 255 mg/dL — ABNORMAL HIGH (ref 70–99)
Glucose-Capillary: 192 mg/dL — ABNORMAL HIGH (ref 70–99)
Glucose-Capillary: 276 mg/dL — ABNORMAL HIGH (ref 70–99)

## 2024-07-05 NOTE — Inpatient Diabetes Management (Signed)
 Inpatient Diabetes Program Recommendations  AACE/ADA: New Consensus Statement on Inpatient Glycemic Control   Target Ranges:  Prepandial:   less than 140 mg/dL      Peak postprandial:   less than 180 mg/dL (1-2 hours)      Critically ill patients:  140 - 180 mg/dL     Latest Reference Range & Units 07/04/24 07:54 07/04/24 11:46 07/04/24 16:34 07/04/24 20:16 07/04/24 21:09 07/05/24 07:40 07/05/24 11:39  Glucose-Capillary 70 - 99 mg/dL 841 (H) 736 (H) 801 (H) 267 (H) 270 (H) 180 (H) 255 (H)   Review of Glycemic Control  Diabetes history: DM2 Outpatient Diabetes medications: Actos 15 mg QAM, Metformin XR 500 mg QAM Current orders for Inpatient glycemic control: Novolog  0-9 units TID with meals, Novolog  0-5 units at bedtime; Solumedrol 60 mg Q12H  Inpatient Diabetes Program Recommendations:    Insulin : If steroids are continued, please consider ordering Novolog  3 units TID with meals for meal coverage if patient eats at least 50% of meals.  Outpatient DM: Due to acute heart failure, please discontinue Actos as an outpatient.  Thanks, Earnie Gainer, RN, MSN, CDCES Diabetes Coordinator Inpatient Diabetes Program [phone] (Team Pager from 8am to 5pm)

## 2024-07-05 NOTE — TOC Progression Note (Signed)
 Transition of Care [facility]) - Progression Note    Patient Details  Name: Chase Malone MRN: [id number] Date of Birth: 1945-08-28  Transition of Care [facility]) CM/SW Contact  Lucie Lunger, [place] Phone Number: 07/05/2024, 9:12 AM  Clinical Narrative:    CSW spoke to pts spouse to review bed offers. Pts spouse states they want to accept bed at [facility]. CSW updated HUB to reflect bed choice. CSW updated Destiny in admissions who states that due to flu they will not be able to accept pt until 1/5. TOC to follow.   Expected Discharge Plan: Skilled Nursing Facility Barriers to Discharge: Continued Medical Work up               Expected Discharge Plan and Services In-house Referral: Clinical Social Work Discharge Planning Services: CM Consult Post Acute Care Choice: Skilled Nursing Facility Living arrangements for the past 2 months: Single Family Home                                       Social Drivers of Health (SDOH) Interventions SDOH Screenings   Food Insecurity: No Food Insecurity (07/02/2024)  Housing: Low Risk (07/02/2024)  Transportation Needs: No Transportation Needs (07/02/2024)  Utilities: Not At Risk (07/02/2024)  Social Connections: Moderately Isolated (07/02/2024)  Tobacco Use: Medium Risk (07/04/2024)    Readmission Risk Interventions    07/03/2024    1:59 PM  Readmission Risk Prevention Plan  Transportation Screening Complete  Home Care Screening Complete  Medication Review (RN CM) Complete

## 2024-07-05 NOTE — Plan of Care (Signed)

## 2024-07-05 NOTE — Progress Notes (Signed)
 "          PROGRESS NOTE  Chase Malone FMW:[id number] DOB: Feb 23, 1946 DOA: 07/02/2024 PCP: [facility]  Brief History:  78 year old male with a history of diabetes mellitus type 2, hypertension, hyperlipidemia, CKD stage III, mild cognitive impairment, anxiety/depression presenting with at least 1 week history of generalized weakness, coughing, shortness of breath, chest congestion.  He states that his cough is mostly clear sputum.  Wife is at the bedside to supplement history.  The patient was trying to go to the bathroom to shave.  He required the help of his grandson as the patient was weak and unsteady.  Wife states that he has been having difficulty getting out of bed because of increasing generalized weakness.  Subsequently, the patient made his way back to the bedroom when he fell.  Fortunately, the patient's grandson and wife were near him and were able to ease him onto the floor.  The patient was found on the floor by EMS.  He seemed confused.  Patient was noted to have oxygen saturation in the low 90s.  He was placed on 3 L by EMS. Wife states that the patient has had a functional decline over the past 2 months.  He has had decreased oral intake for the past week.  He had 2 falls this past week.  She states that he has not been placed on any new medications.  There is not been any complaints of chest pain, hemoptysis, nausea, vomiting, diarrhea, abdominal pain, dysuria, hematuria.  In the ED, the patient was febrile up to 100.7 F.  He was hemodynamically stable.  Oxygen saturation was 90% on room air.  He was placed on 2 L with oxygen up to 95%.  WBC 6.7, hemoglobin 1.6, platelets 100,000.  Sodium 135, potassium 4.3, bicarbonate 21, serum creatinine 1.89.  AST 37, ALT 15, alk phosphatase 68, total bilirubin 0.9.  Lactic acid 3.0>> 3.4.  VBG showed 7.4 5/34/56/23.  Chest x-ray showed mild diffuse interstitial opacities.  CT brain was negative for any acute findings.  Influenza A  was positive.  Ammonia 51.  EKG showed sinus rhythm nonspecific ST changes.  The patient was started on oseltamavir, vancomycin , cefepime .  He was given 3 L of LR bolus.   Assessment/Plan:  Sepsis  - Present on admission - Presented with fever and tachycardia - Secondary to influenza - UA negative for pyuria - Follow blood cultures--neg to date - Chest x-ray with bilateral interstitial opacities - sepsis physiology resolved   Acute HFpEF -start daily weight -continue IV lasix  daily -accurate I/Os--incomplete - 12/29 Echo EF 60-65%, no WMA, , normal RVF, mild MR - obtain ReDS vest reading -d/c pioglitazone   Pulmonary infiltrates - secondary to influenza and pulm edema - Check PCT 0.40 - continue empiric ceftriaxone /azithro--finish 5 days on 12/31 -12/29 CT chest--Patchy ground-glass and reticulonodular infiltrates, suspicious for atypical infection; Small bilateral pleural effusions with bibasilar compressive atelectasis. Trace interstitial pulmonary edema   Influenza pneumonitis - Continue oseltamavir - Continue bronchodilators - add solumedrol for bronchospasms   CKD stage IIIb - Baseline creatinine 1.6-1.8   Thrombocytopenia - Secondary to infectious process - TSH--2.040 - B12--251 - Folic acid --3.5   Lactic acidosis - initially on IV fluids>>stopped with fluid overload   Generalized weakness - PT evaluation>>SNF - Workup as above   Diabetes mellitus type 2 - Holding metformin - Check hemoglobin A1c 6.1 - NovoLog  sliding scale   Mixed hyperlipidemia - Continue statin   Depression/anxiety - Continue  fluoxetine  and clonazepam                Family Communication:   wife 12/30   Consultants:  none   Code Status:  FULL    DVT Prophylaxis:  SCDs     Procedures: As Listed in Progress Note Above   Antibiotics: Ceftriaxone  12/27>>12/31 Azithro 12/27>>12/31          Subjective: Overall, patient is breathing better.  Denies f/c, cp,  n/v/d.  Has some dyspnea on exertion.  Objective: Vitals:   07/05/24 0426 07/05/24 0815 07/05/24 0817 07/05/24 0822  BP:      Pulse:      Resp:      Temp:      TempSrc:      SpO2:  95% 100% 100%  Weight: 87.6 kg     Height:        Intake/Output Summary (Last 24 hours) at 07/05/2024 1054 Last data filed at 07/05/2024 0100 Gross per 24 hour  Intake 100 ml  Output 700 ml  Net -600 ml   Weight change: 5.3 kg Exam:  General:  Pt is alert, follows commands appropriately, not in acute distress HEENT: No icterus, No thrush, No neck mass, [place]/AT Cardiovascular: RRR, S1/S2, no rubs, no gallops Respiratory: CTA bilaterally, no wheezing, no crackles, no rhonchi Abdomen: Soft/+BS, non tender, non distended, no guarding Extremities: No edema, No lymphangitis, No petechiae, No rashes, no synovitis   Data Reviewed: I have personally reviewed following labs and imaging studies Basic Metabolic Panel: Recent Labs  Lab 07/02/24 0513 07/03/24 0437 07/04/24 0501 07/05/24 0955  NA 135 139 139 139  K 4.3 3.9 4.1 3.6  CL 101 100 102 100  CO2 21* 22 27 21*  GLUCOSE 155* 168* 179* 198*  BUN 13 17 26* 32*  CREATININE 1.89* 1.97* 1.91* 1.69*  CALCIUM  8.9 8.8* 8.6* 8.9  MG  --  2.0 2.2  --    Liver Function Tests: Recent Labs  Lab 07/02/24 0513  AST 37  ALT 15  ALKPHOS 68  BILITOT 0.9  PROT 6.1*  ALBUMIN  3.5   No results for input(s): LIPASE, AMYLASE in the last 168 hours. Recent Labs  Lab 07/02/24 0534  AMMONIA 51*   Coagulation Profile: Recent Labs  Lab 07/02/24 0513  INR 1.2   CBC: Recent Labs  Lab 07/02/24 0513 07/03/24 0437  WBC 6.7 5.0  NEUTROABS 5.6  --   HGB 11.6* 11.1*  HCT 35.5* 33.6*  MCV 94.4 94.6  PLT 100* 92*   Cardiac Enzymes: No results for input(s): CKTOTAL, CKMB, CKMBINDEX, TROPONINI in the last 168 hours. BNP: Invalid input(s): POCBNP CBG: Recent Labs  Lab 07/04/24 1146 07/04/24 1634 07/04/24 2016 07/04/24 2109  07/05/24 0740  GLUCAP 263* 198* 267* 270* 180*   HbA1C: Recent Labs    07/02/24 1525  HGBA1C 6.1*   Urine analysis:    Component Value Date/Time   COLORURINE YELLOW 07/02/2024 0541   APPEARANCEUR CLEAR 07/02/2024 0541   APPEARANCEUR Clear 05/09/2021 1023   LABSPEC 1.019 07/02/2024 0541   PHURINE 5.0 07/02/2024 0541   GLUCOSEU NEGATIVE 07/02/2024 0541   HGBUR MODERATE (A) 07/02/2024 0541   BILIRUBINUR NEGATIVE 07/02/2024 0541   BILIRUBINUR Negative 05/09/2021 1023   KETONESUR 5 (A) 07/02/2024 0541   PROTEINUR 30 (A) 07/02/2024 0541   NITRITE NEGATIVE 07/02/2024 0541   LEUKOCYTESUR NEGATIVE 07/02/2024 0541   Sepsis Labs: @LABRCNTIP (procalcitonin:4,lacticidven:4) ) Recent Results (from the past 240 hours)  Blood culture (routine x 2)  Status: None (Preliminary result)   Collection Time: 07/02/24  5:13 AM   Specimen: BLOOD RIGHT FOREARM  Result Value Ref Range Status   Specimen Description BLOOD RIGHT FOREARM  Final   Special Requests   Final    BOTTLES DRAWN AEROBIC AND ANAEROBIC Blood Culture results may not be optimal due to an inadequate volume of blood received in culture bottles   Culture   Final    NO GROWTH 3 DAYS Performed at [facility], [address], [place], [place] [address]    Report Status PENDING  Incomplete  Blood culture (routine x 2)     Status: None (Preliminary result)   Collection Time: 07/02/24  5:13 AM   Specimen: Left Antecubital; Blood  Result Value Ref Range Status   Specimen Description LEFT ANTECUBITAL  Final   Special Requests   Final    BOTTLES DRAWN AEROBIC AND ANAEROBIC Blood Culture results may not be optimal due to an inadequate volume of blood received in culture bottles   Culture   Final    NO GROWTH 3 DAYS Performed at [facility], [address], [place], [place] [address]    Report Status PENDING  Incomplete  Resp panel by RT-PCR (RSV, Flu A&B, Covid) Anterior Nasal Swab     Status: Abnormal   Collection Time: 07/02/24   5:13 AM   Specimen: Anterior Nasal Swab  Result Value Ref Range Status   SARS Coronavirus 2 by RT PCR NEGATIVE NEGATIVE Final    Comment: (NOTE) SARS-CoV-2 target nucleic acids are NOT DETECTED.  The SARS-CoV-2 RNA is generally detectable in upper respiratory specimens during the acute phase of infection. The lowest concentration of SARS-CoV-2 viral copies this assay can detect is 138 copies/mL. A negative result does not preclude SARS-Cov-2 infection and should not be used as the sole basis for treatment or other patient management decisions. A negative result may occur with  improper specimen collection/handling, submission of specimen other than nasopharyngeal swab, presence of viral mutation(s) within the areas targeted by this assay, and inadequate number of viral copies(<138 copies/mL). A negative result must be combined with clinical observations, patient history, and epidemiological information. The expected result is Negative.  Fact Sheet for Patients:  bloggercourse.com  Fact Sheet for Healthcare Providers:  seriousbroker.it  This test is no t yet approved or cleared by the [place]  FDA and  has been authorized for detection and/or diagnosis of SARS-CoV-2 by FDA under an Emergency Use Authorization (EUA). This EUA will remain  in effect (meaning this test can be used) for the duration of the COVID-19 declaration under Section 564(b)(1) of the Act, 21 U.S.C.section 360bbb-3(b)(1), unless the authorization is terminated  or revoked sooner.       Influenza A by PCR POSITIVE (A) NEGATIVE Final   Influenza B by PCR NEGATIVE NEGATIVE Final    Comment: (NOTE) The Xpert Xpress SARS-CoV-2/FLU/RSV plus assay is intended as an aid in the diagnosis of influenza from Nasopharyngeal swab specimens and should not be used as a sole basis for treatment. Nasal washings and aspirates are unacceptable for Xpert Xpress  SARS-CoV-2/FLU/RSV testing.  Fact Sheet for Patients: bloggercourse.com  Fact Sheet for Healthcare Providers: seriousbroker.it  This test is not yet approved or cleared by the [place]  FDA and has been authorized for detection and/or diagnosis of SARS-CoV-2 by FDA under an Emergency Use Authorization (EUA). This EUA will remain in effect (meaning this test can be used) for the duration of the COVID-19 declaration under Section  564(b)(1) of the Act, 21 U.S.C. section 360bbb-3(b)(1), unless the authorization is terminated or revoked.     Resp Syncytial Virus by PCR NEGATIVE NEGATIVE Final    Comment: (NOTE) Fact Sheet for Patients: bloggercourse.com  Fact Sheet for Healthcare Providers: seriousbroker.it  This test is not yet approved or cleared by the [place]  FDA and has been authorized for detection and/or diagnosis of SARS-CoV-2 by FDA under an Emergency Use Authorization (EUA). This EUA will remain in effect (meaning this test can be used) for the duration of the COVID-19 declaration under Section 564(b)(1) of the Act, 21 U.S.C. section 360bbb-3(b)(1), unless the authorization is terminated or revoked.  Performed at [facility], [address] [place], [place] [address]   Urine Culture     Status: None   Collection Time: 07/02/24  5:41 AM   Specimen: Urine, Catheterized  Result Value Ref Range Status   Specimen Description   Final    URINE, CATHETERIZED Performed at [facility], [address] [place], [place] [address]    Special Requests   Final    NONE Performed at [facility], [address], [place], [place] [address]    Culture   Final    NO GROWTH Performed at [facility], [address], [place], [place] [address]    Report Status 07/03/2024 FINAL  Final     Scheduled Meds:  amitriptyline   10 mg Oral QHS   arformoterol   15 mcg  Nebulization BID   aspirin  EC  81 mg Oral Daily   azithromycin   500 mg Oral Daily   budesonide  (PULMICORT ) nebulizer solution  0.5 mg Nebulization BID   cholecalciferol   2,000 Units Oral q morning   clonazePAM   1 mg Oral QHS   FLUoxetine   20 mg Oral q morning   folic acid   1 mg Oral Daily   furosemide   40 mg Intravenous Daily   insulin  aspart  0-5 Units Subcutaneous QHS   insulin  aspart  0-9 Units Subcutaneous TID WC   ipratropium-albuterol   3 mL Nebulization BID   loratadine   10 mg Oral Daily   methylPREDNISolone  (SOLU-MEDROL ) injection  60 mg Intravenous Q12H   oseltamivir   30 mg Oral Daily   pantoprazole   80 mg Oral BH-q7a   pregabalin   75 mg Oral Daily   And   pregabalin   150 mg Oral QHS   simvastatin   20 mg Oral QHS   timolol   1 drop Both Eyes q morning   vitamin B-12  500 mcg Oral Daily   Continuous Infusions:  cefTRIAXone  (ROCEPHIN )  IV Stopped (07/04/24 1738)    Procedures/Studies: ECHOCARDIOGRAM COMPLETE Result Date: 07/04/2024    ECHOCARDIOGRAM REPORT   Patient Name:   Juvenal R Cappella Date of Exam: 07/04/2024 Medical Rec #:  [id number]     Height:       66.0 in Accession #:    [id number]    Weight:       181.4 lb Date of Birth:  [birth year]     BSA:          1.919 m Patient Age:    78 years      BP:           122/72 mmHg Patient Gender: M             HR:           82 bpm. Exam Location:  [facility] Procedure: 2D Echo, Cardiac Doppler, Color Doppler and Strain Analysis (Both  Spectral and Color Flow Doppler were utilized during procedure). Indications:    CHF-Acute Diastolic I50.31  History:        Patient has prior history of Echocardiogram examinations, most                 recent 06/08/2019. CHF, Signs/Symptoms:Syncope; Risk                 Factors:Former Smoker, Diabetes, Dyslipidemia and Hypertension.  Sonographer:    Aida Pizza RCS Referring Phys: [phone] Jaunice Mirza  Sonographer Comments: Global longitudinal strain was attempted. IMPRESSIONS  1. Left ventricular  ejection fraction, by estimation, is 60 to 65%. The left ventricle has normal function. The left ventricle has no regional wall motion abnormalities. Left ventricular diastolic parameters are indeterminate. The average left ventricular global longitudinal strain is -9.7 %.  2. Right ventricular systolic function is normal. The right ventricular size is normal. Tricuspid regurgitation signal is inadequate for assessing PA pressure.  3. The mitral valve is normal in structure. Mild mitral valve regurgitation. No evidence of mitral stenosis.  4. The aortic valve is normal in structure. Aortic valve regurgitation is not visualized. Aortic valve sclerosis/calcification is present, without any evidence of aortic stenosis. Aortic valve mean gradient measures 6.0 mmHg.  5. The inferior vena cava is dilated in size with >50% respiratory variability, suggesting right atrial pressure of 8 mmHg. Comparison(s): No significant change from prior study. FINDINGS  Left Ventricle: Left ventricular ejection fraction, by estimation, is 60 to 65%. The left ventricle has normal function. The left ventricle has no regional wall motion abnormalities. The average left ventricular global longitudinal strain is -9.7 %. Strain was performed and the global longitudinal strain is indeterminate. The left ventricular internal cavity size was normal in size. There is no left ventricular hypertrophy. Left ventricular diastolic parameters are indeterminate. Right Ventricle: The right ventricular size is normal. No increase in right ventricular wall thickness. Right ventricular systolic function is normal. Tricuspid regurgitation signal is inadequate for assessing PA pressure. Left Atrium: Left atrial size was normal in size. Right Atrium: Right atrial size was normal in size. Pericardium: There is no evidence of pericardial effusion. Mitral Valve: The mitral valve is normal in structure. Mild mitral valve regurgitation. No evidence of mitral valve  stenosis. Tricuspid Valve: The tricuspid valve is normal in structure. Tricuspid valve regurgitation is trivial. No evidence of tricuspid stenosis. Aortic Valve: The aortic valve is normal in structure. Aortic valve regurgitation is not visualized. Aortic valve sclerosis/calcification is present, without any evidence of aortic stenosis. Aortic valve mean gradient measures 6.0 mmHg. Aortic valve peak  gradient measures 11.8 mmHg. Aortic valve area, by VTI measures 1.91 cm. Pulmonic Valve: The pulmonic valve was not well visualized. Pulmonic valve regurgitation is not visualized. No evidence of pulmonic stenosis. Aorta: The aortic root is normal in size and structure. Venous: The inferior vena cava is dilated in size with greater than 50% respiratory variability, suggesting right atrial pressure of 8 mmHg. IAS/Shunts: No atrial level shunt detected by color flow Doppler. Additional Comments: 3D was performed not requiring image post processing on an independent workstation and was indeterminate.  LEFT VENTRICLE PLAX 2D LVIDd:         5.00 cm      Diastology LVIDs:         3.30 cm      LV e' medial:    9.32 cm/s LV PW:         1.00 cm  LV E/e' medial:  11.7 LV IVS:        1.00 cm      LV e' lateral:   10.30 cm/s LVOT diam:     1.80 cm      LV E/e' lateral: 10.6 LV SV:         67 LV SV Index:   35           2D Longitudinal Strain LVOT Area:     2.54 cm     2D Strain GLS Avg:     -9.7 %  LV Volumes (MOD) LV vol d, MOD A2C: 80.7 ml LV vol d, MOD A4C: 103.0 ml LV vol s, MOD A2C: 28.1 ml LV vol s, MOD A4C: 37.1 ml LV SV MOD A2C:     52.6 ml LV SV MOD A4C:     103.0 ml LV SV MOD BP:      60.9 ml RIGHT VENTRICLE RV S prime:     12.70 cm/s TAPSE (M-mode): 2.1 cm LEFT ATRIUM             Index        RIGHT ATRIUM           Index LA diam:        4.00 cm 2.08 cm/m   RA Area:     14.10 cm LA Vol (A2C):   56.4 ml 29.39 ml/m  RA Volume:   32.80 ml  17.09 ml/m LA Vol (A4C):   55.3 ml 28.82 ml/m LA Biplane Vol: 58.4 ml 30.44  ml/m  AORTIC VALVE AV Area (Vmax):    1.82 cm AV Area (Vmean):   2.07 cm AV Area (VTI):     1.91 cm AV Vmax:           172.00 cm/s AV Vmean:          105.000 cm/s AV VTI:            0.348 m AV Peak Grad:      11.8 mmHg AV Mean Grad:      6.0 mmHg LVOT Vmax:         123.00 cm/s LVOT Vmean:        85.600 cm/s LVOT VTI:          0.262 m LVOT/AV VTI ratio: 0.75  AORTA Ao Root diam: 3.30 cm MITRAL VALVE MV Area (PHT): 3.91 cm     SHUNTS MV Decel Time: 194 msec     Systemic VTI:  0.26 m MV E velocity: 109.00 cm/s  Systemic Diam: 1.80 cm MV A velocity: 99.00 cm/s MV E/A ratio:  1.10 Vishnu Priya Mallipeddi Electronically signed by Diannah Late Mallipeddi Signature Date/Time: 07/04/2024/3:53:59 PM    Final    CT CHEST WO CONTRAST Result Date: 07/04/2024 EXAM: CT CHEST WITHOUT CONTRAST 07/03/2024 03:00:33 PM TECHNIQUE: CT of the chest was performed without the administration of intravenous contrast. Multiplanar reformatted images are provided for review. Automated exposure control, iterative reconstruction, and/or weight based adjustment of the mA/kV was utilized to reduce the radiation dose to as low as reasonably achievable. COMPARISON: None available. CLINICAL HISTORY: Respiratory illness, nondiagnostic xray. FINDINGS: MEDIASTINUM: Heart: Extensive coronary artery calcification. Global cardiac size within normal limits. No pericardial effusion. Vessels: Central pulmonary arteries are of normal caliber. Mild atherosclerotic calcification within the thoracic aorta. No aortic aneurysm. Central airways: The central airways are clear. LYMPH NODES: No mediastinal, hilar or axillary lymphadenopathy. LUNGS AND PLEURA: Small bilateral pleural effusions with associated bibasilar compressive  atelectasis. Trace interstitial pulmonary edema. Superimposed patchy ground glass and reticular nodular infiltrates are seen, most prevalent within the left lower lobe, suspicious for atypical infection in the appropriate clinical  setting. No central obstructing lesion. No pneumothorax. SOFT TISSUES/BONES: No acute abnormality of the bones or soft tissues. UPPER ABDOMEN: Small hiatal hernia. Cirrhosis. Recanalized umbilical vein in keeping with changes of portal venous hypertension. Cholecystectomy and right nephrectomy have been performed. IMPRESSION: 1. Patchy ground-glass and reticulonodular infiltrates, suspicious for atypical infection. 2. Extensive coronary artery calcification. 3. Small bilateral pleural effusions with bibasilar compressive atelectasis. Trace interstitial pulmonary edema. Together, the findings may reflect changes of mild cardiogenic failure. 4. Cirrhosis with recanalized umbilical vein, consistent with portal hypertension. 5. Small hiatal hernia. Electronically signed by: Dorethia Molt MD 07/04/2024 02:06 AM EST RP Workstation: [id number]   CT HEAD WO CONTRAST Result Date: 07/02/2024 EXAM: CT HEAD WITHOUT CONTRAST 07/02/2024 05:58:10 AM TECHNIQUE: CT of the head was performed without the administration of intravenous contrast. Automated exposure control, iterative reconstruction, and/or weight based adjustment of the mA/kV was utilized to reduce the radiation dose to as low as reasonably achievable. COMPARISON: Head CT 06/15/2010. CLINICAL HISTORY: 78 year old male. Fall at home. Increased confusion from baseline. FINDINGS: BRAIN AND VENTRICLES: Background brain volume remains normal for age. 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 Mild heterogeneity elsewhere in the bilateral basal ganglia (right caudate series 2 image 35 which also appears likely to be chronic). Gray white differentiation otherwise normal for age. No acute hemorrhage. No evidence of acute infarct. No hydrocephalus. No extra-axial collection. No mass effect or midline shift. Calcified atherosclerosis at the skull base. No suspicious intracranial vascular hyperdensity. ORBITS: Postoperative  changes to both globes since 2011. No acute orbit injury identified. SINUSES: Trace low density fluid in the right sphenoid sinus appears inflammatory, while other visible paranasal sinuses, tympanic cavities and mastoids appear clear. SOFT TISSUES AND SKULL: No acute scalp soft tissue injury identified. No skull fracture. IMPRESSION: 1. 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 2. No acute intracranial abnormality.  No acute traumatic injury identified. Electronically signed by: Helayne Hurst MD 07/02/2024 06:04 AM EST RP Workstation: [id number]   DG Chest Port 1 View Result Date: 07/02/2024 EXAM: 1 VIEW(S) XRAY OF THE CHEST 07/02/2024 05:27:54 AM COMPARISON: Portable chest x ray 06/14/2010. CLINICAL HISTORY: 78 year old male with questionable sepsis, evaluate for abnormality. FINDINGS: LUNGS AND PLEURA: Mildly lower lung volumes. Mild diffuse, symmetric increased pulmonary interstitial markings. No consolidation. No convincing confluent lung opacity. No pleural effusion. No pneumothorax. HEART AND MEDIASTINUM: Mediastinal contours remain normal. Negative visible trachea. BONES AND SOFT TISSUES: No acute osseous abnormality. VISIBLE ABDOMEN: Paucity of bowel gas in the visible abdomen. Cholecystectomy clips suspected. IMPRESSION: 1. Mild diffuse interstitial opacity. Favor vascular congestion / interstitial edema, but viral/atypical respiratory infection could appear similar. Electronically signed by: Helayne Hurst MD 07/02/2024 05:31 AM EST RP Workstation: [id number]    Alm Schneider, DO  [facility]  If 7PM-7AM, please contact night-coverage [email or web address] Password [email or web address] 07/05/2024, 10:54 AM   LOS: 3 days   "

## 2024-07-06 DIAGNOSIS — A419 Sepsis, unspecified organism: Secondary | ICD-10-CM | POA: Diagnosis not present

## 2024-07-06 DIAGNOSIS — I5031 Acute diastolic (congestive) heart failure: Secondary | ICD-10-CM | POA: Diagnosis not present

## 2024-07-06 DIAGNOSIS — E872 Acidosis, unspecified: Secondary | ICD-10-CM | POA: Diagnosis not present

## 2024-07-06 DIAGNOSIS — R5381 Other malaise: Secondary | ICD-10-CM | POA: Diagnosis not present

## 2024-07-06 DIAGNOSIS — N1832 Chronic kidney disease, stage 3b: Secondary | ICD-10-CM | POA: Diagnosis not present

## 2024-07-06 DIAGNOSIS — J101 Influenza due to other identified influenza virus with other respiratory manifestations: Secondary | ICD-10-CM | POA: Diagnosis not present

## 2024-07-06 DIAGNOSIS — D696 Thrombocytopenia, unspecified: Secondary | ICD-10-CM | POA: Diagnosis not present

## 2024-07-06 DIAGNOSIS — J9601 Acute respiratory failure with hypoxia: Secondary | ICD-10-CM | POA: Diagnosis not present

## 2024-07-06 LAB — BASIC METABOLIC PANEL WITH GFR
Anion gap: 16 — ABNORMAL HIGH (ref 5–15)
BUN: 32 mg/dL — ABNORMAL HIGH (ref 8–23)
CO2: 22 mmol/L (ref 22–32)
Calcium: 8.6 mg/dL — ABNORMAL LOW (ref 8.9–10.3)
Chloride: 100 mmol/L (ref 98–111)
Creatinine, Ser: 1.56 mg/dL — ABNORMAL HIGH (ref 0.61–1.24)
GFR, Estimated: 45 mL/min — ABNORMAL LOW
Glucose, Bld: 218 mg/dL — ABNORMAL HIGH (ref 70–99)
Potassium: 3.7 mmol/L (ref 3.5–5.1)
Sodium: 137 mmol/L (ref 135–145)

## 2024-07-06 LAB — GLUCOSE, CAPILLARY
Glucose-Capillary: 210 mg/dL — ABNORMAL HIGH (ref 70–99)
Glucose-Capillary: 302 mg/dL — ABNORMAL HIGH (ref 70–99)
Glucose-Capillary: 346 mg/dL — ABNORMAL HIGH (ref 70–99)
Glucose-Capillary: 261 mg/dL — ABNORMAL HIGH (ref 70–99)

## 2024-07-06 LAB — MAGNESIUM: Magnesium: 2.4 mg/dL (ref 1.7–2.4)

## 2024-07-06 MED ORDER — METHYLPREDNISOLONE SODIUM SUCC 40 MG IJ SOLR
40.0000 mg | Freq: Two times a day (BID) | INTRAMUSCULAR | Status: DC
  Administered 2024-07-06 – 2024-07-08 (×4): 40 mg via INTRAVENOUS
  Filled 2024-07-06 (×4): qty 1

## 2024-07-06 MED ORDER — INSULIN ASPART 100 UNIT/ML IJ SOLN
3.0000 [IU] | Freq: Three times a day (TID) | INTRAMUSCULAR | Status: DC
  Administered 2024-07-07 – 2024-07-11 (×14): 3 [IU] via SUBCUTANEOUS
  Filled 2024-07-06 (×13): qty 1

## 2024-07-06 NOTE — Inpatient Diabetes Management (Signed)
 Inpatient Diabetes Program Recommendations  AACE/ADA: New Consensus Statement on Inpatient Glycemic Control  Target Ranges:  Prepandial:   less than 140 mg/dL      Peak postprandial:   less than 180 mg/dL (1-2 hours)      Critically ill patients:  140 - 180 mg/dL    Latest Reference Range & Units 07/05/24 07:40 07/05/24 11:39 07/05/24 16:17 07/05/24 21:55 07/06/24 07:39  Glucose-Capillary 70 - 99 mg/dL 819 (H) 744 (H) 807 (H) 276 (H) 210 (H)    Latest Reference Range & Units 07/04/24 07:54 07/04/24 11:46 07/04/24 16:34 07/04/24 20:16 07/04/24 21:09  Glucose-Capillary 70 - 99 mg/dL 841 (H) 736 (H) 801 (H) 267 (H) 270 (H)   Review of Glycemic Control  Diabetes history: DM2 Outpatient Diabetes medications: Actos 15 mg QAM, Metformin XR 500 mg QAM Current orders for Inpatient glycemic control: Novolog  0-9 units TID with meals, Novolog  0-5 units at bedtime; Solumedrol 60 mg Q12H   Inpatient Diabetes Program Recommendations:     Insulin : If steroids are continued, please consider ordering Novolog  3 units TID with meals for meal coverage if patient eats at least 50% of meals.   Outpatient DM: Due to acute heart failure, please discontinue Actos as an outpatient.   Thanks, Earnie Gainer, RN, MSN, CDCES Diabetes Coordinator Inpatient Diabetes Program [phone] (Team Pager from 8am to 5pm)

## 2024-07-06 NOTE — Progress Notes (Signed)
 "          Chase Malone FMW:[id number] DOB: 10-05-1945 DOA: 07/02/2024 PCP: [facility]  Brief History:  78 year old male with a history of diabetes mellitus type 2, hypertension, hyperlipidemia, CKD stage III, mild cognitive impairment, anxiety/depression presenting with at least 1 week history of generalized weakness, coughing, shortness of breath, chest congestion.  He states that his cough is mostly clear sputum.  Wife is at the bedside to supplement history.  The patient was trying to go to the bathroom to shave.  He required the help of his grandson as the patient was weak and unsteady.  Wife states that he has been having difficulty getting out of bed because of increasing generalized weakness.  Subsequently, the patient made his way back to the bedroom when he fell.  Fortunately, the patient's grandson and wife were near him and were able to ease him onto the floor.  The patient was found on the floor by EMS.  He seemed confused.  Patient was noted to have oxygen saturation in the low 90s.  He was placed on 3 L by EMS. Wife states that the patient has had a functional decline over the past 2 months.  He has had decreased oral intake for the past week.  He had 2 falls this past week.  She states that he has not been placed on any new medications.  There is not been any complaints of chest pain, hemoptysis, nausea, vomiting, diarrhea, abdominal pain, dysuria, hematuria.   In the ED, the patient was febrile up to 100.7 F.  He was hemodynamically stable.  Oxygen saturation was 90% on room air.  He was placed on 2 L with oxygen up to 95%.  WBC 6.7, hemoglobin 1.6, platelets 100,000.  Sodium 135, potassium 4.3, bicarbonate 21, serum creatinine 1.89.  AST 37, ALT 15, alk phosphatase 68, total bilirubin 0.9.  Lactic acid 3.0>> 3.4.  VBG showed 7.4 5/34/56/23.  Chest x-ray showed mild diffuse interstitial opacities.  CT brain was negative for any acute findings.  Influenza A  was positive.  Ammonia 51.  EKG showed sinus rhythm nonspecific ST changes.  The patient was started on oseltamavir, vancomycin , cefepime .  He was given 3 L of LR bolus.    Assessment/Plan:  Sepsis  - Present on admission - Presented with fever and tachycardia - Secondary to influenza - UA negative for pyuria - Follow blood cultures--neg to date - Chest x-ray with bilateral interstitial opacities - sepsis physiology resolved and antibiotic therapy completed.. - Continue mental and adequate oral hydration and supportive care.   Acute HFpEF - Continue daily weight. - 12/29 Echo EF 60-65%, no WMA, , normal RVF, mild MR - continue to obtain ReDS vest reading -d/c pioglitazone -Low-sodium diet has been discussed with patient - Continue IV diuresis and follow strict intake and output.   Pulmonary infiltrates - secondary to influenza and pulm edema - Check PCT 0.40 - continue empiric ceftriaxone /azithro--finish 5 days on 12/31 -12/29 CT chest--Patchy ground-glass and reticulonodular infiltrates, suspicious for atypical infection; Small bilateral pleural effusions with bibasilar compressive atelectasis. Trace interstitial pulmonary edema   Influenza pneumonitis - Has completed treatment with Tamiflu . - Continue bronchodilators - Continue supportive care, antipyretics and steroids tapering.   CKD stage IIIb - Baseline creatinine 1.6-1.8   Thrombocytopenia - Secondary to infectious process - TSH--2.040 - B12--251 - Folic acid --3.5   Lactic acidosis - initially on IV fluids>>stopped with fluid overload   Generalized weakness -  PT evaluation>>SNF - Workup as above -Per TOC report patient most likely amenable for skilled nursing facility discharge based on influenza diagnosis on 07/11/2024   Diabetes mellitus type 2 - Holding metformin - Check hemoglobin A1c 6.1 - NovoLog  sliding scale -CBGs elevated in the setting of his steroids usage - Continue to follow fluctuation and  adjust hypoglycemic regimen as needed -NovoLog  meal coverage has been added.   Mixed hyperlipidemia - Continue statin   Depression/anxiety - Continue fluoxetine  and clonazepam         Family Communication:   No family at bedside.   Consultants:  none   Code Status:  FULL    DVT Prophylaxis:  SCDs     Procedures: As Listed in Chase Note Above   Antibiotics: Ceftriaxone  12/27>>12/31 Azithro 12/27>>12/31    Subjective: Still reporting short winded sensation with activity; no requiring oxygen supplementation and overall feeling better.  Good saturation on room air.  No chest pain, no nausea, no vomiting.  Objective: Vitals:   07/06/24 0815 07/06/24 0819 07/06/24 0827 07/06/24 1343  BP:    111/66  Pulse:    75  Resp:    17  Temp:    97.9 F (36.6 C)  TempSrc:    Oral  SpO2: 97% 100% 100% 98%  Weight:      Height:        Intake/Output Summary (Last 24 hours) at 07/06/2024 1836 Last data filed at 07/06/2024 1133 Gross per 24 hour  Intake 720 ml  Output 1600 ml  Net -880 ml   Weight change: -1.1 kg  Exam: General exam: Alert, awake, in no acute distress. Respiratory system: Improved air movement bilaterally; positive rhonchi appreciated.  No using accessory muscle. Cardiovascular system:RRR. No murmurs, rubs, gallops. Gastrointestinal system: Abdomen is nondistended, soft and nontender. No organomegaly or masses felt. Normal bowel sounds heard. Central nervous system: Generally weak.  No focal neurological deficits. Extremities: No cyanosis or clubbing. Skin: No petechiae. Psychiatry: Judgement and insight appear normal. Mood & affect appropriate.    Data Reviewed: I have personally reviewed following labs and imaging studies  Basic Metabolic Panel: Recent Labs  Lab 07/02/24 0513 07/03/24 0437 07/04/24 0501 07/05/24 0955 07/06/24 0509  NA 135 139 139 139 137  K 4.3 3.9 4.1 3.6 3.7  CL 101 100 102 100 100  CO2 21* 22 27 21* 22  GLUCOSE 155*  168* 179* 198* 218*  BUN 13 17 26* 32* 32*  CREATININE 1.89* 1.97* 1.91* 1.69* 1.56*  CALCIUM  8.9 8.8* 8.6* 8.9 8.6*  MG  --  2.0 2.2  --  2.4   Liver Function Tests: Recent Labs  Lab 07/02/24 0513  AST 37  ALT 15  ALKPHOS 68  BILITOT 0.9  PROT 6.1*  ALBUMIN  3.5   No results for input(s): LIPASE, AMYLASE in the last 168 hours. Recent Labs  Lab 07/02/24 0534  AMMONIA 51*   Coagulation Profile: Recent Labs  Lab 07/02/24 0513  INR 1.2   CBC: Recent Labs  Lab 07/02/24 0513 07/03/24 0437  WBC 6.7 5.0  NEUTROABS 5.6  --   HGB 11.6* 11.1*  HCT 35.5* 33.6*  MCV 94.4 94.6  PLT 100* 92*   CBG: Recent Labs  Lab 07/05/24 1617 07/05/24 2155 07/06/24 0739 07/06/24 1125 07/06/24 1639  GLUCAP 192* 276* 210* 302* 346*   HbA1C: No results for input(s): HGBA1C in the last 72 hours.  Urine analysis:    Component Value Date/Time   COLORURINE YELLOW 07/02/2024 0541  APPEARANCEUR CLEAR 07/02/2024 0541   APPEARANCEUR Clear 05/09/2021 1023   LABSPEC 1.019 07/02/2024 0541   PHURINE 5.0 07/02/2024 0541   GLUCOSEU NEGATIVE 07/02/2024 0541   HGBUR MODERATE (A) 07/02/2024 0541   BILIRUBINUR NEGATIVE 07/02/2024 0541   BILIRUBINUR Negative 05/09/2021 1023   KETONESUR 5 (A) 07/02/2024 0541   PROTEINUR 30 (A) 07/02/2024 0541   NITRITE NEGATIVE 07/02/2024 0541   LEUKOCYTESUR NEGATIVE 07/02/2024 0541   Sepsis Labs: @LABRCNTIP (procalcitonin:4,lacticidven:4) ) Recent Results (from the past 240 hours)  Blood culture (routine x 2)     Status: None (Preliminary result)   Collection Time: 07/02/24  5:13 AM   Specimen: BLOOD RIGHT FOREARM  Result Value Ref Range Status   Specimen Description BLOOD RIGHT FOREARM  Final   Special Requests   Final    BOTTLES DRAWN AEROBIC AND ANAEROBIC Blood Culture results may not be optimal due to an inadequate volume of blood received in culture bottles   Culture   Final    NO GROWTH 4 DAYS Performed at [facility], [address] [place], [place] [address]    Report Status PENDING  Incomplete  Blood culture (routine x 2)     Status: None (Preliminary result)   Collection Time: 07/02/24  5:13 AM   Specimen: Left Antecubital; Blood  Result Value Ref Range Status   Specimen Description LEFT ANTECUBITAL  Final   Special Requests   Final    BOTTLES DRAWN AEROBIC AND ANAEROBIC Blood Culture results may not be optimal due to an inadequate volume of blood received in culture bottles   Culture   Final    NO GROWTH 4 DAYS Performed at [facility], [address] [place], [place] [address]    Report Status PENDING  Incomplete  Resp panel by RT-PCR (RSV, Flu A&B, Covid) Anterior Nasal Swab     Status: Abnormal   Collection Time: 07/02/24  5:13 AM   Specimen: Anterior Nasal Swab  Result Value Ref Range Status   SARS Coronavirus 2 by RT PCR NEGATIVE NEGATIVE Final    Comment: (NOTE) SARS-CoV-2 target nucleic acids are NOT DETECTED.  The SARS-CoV-2 RNA is generally detectable in upper respiratory specimens during the acute phase of infection. The lowest concentration of SARS-CoV-2 viral copies this assay can detect is 138 copies/mL. A negative result does not preclude SARS-Cov-2 infection and should not be used as the sole basis for treatment or other patient management decisions. A negative result may occur with  improper specimen collection/handling, submission of specimen other than nasopharyngeal swab, presence of viral mutation(s) within the areas targeted by this assay, and inadequate number of viral copies(<138 copies/mL). A negative result must be combined with clinical observations, patient history, and epidemiological information. The expected result is Negative.  Fact Sheet for Patients:  bloggercourse.com  Fact Sheet for Healthcare Providers:  seriousbroker.it  This test is no t yet approved or cleared by the [place]  FDA and  has been authorized  for detection and/or diagnosis of SARS-CoV-2 by FDA under an Emergency Use Authorization (EUA). This EUA will remain  in effect (meaning this test can be used) for the duration of the COVID-19 declaration under Section 564(b)(1) of the Act, 21 U.S.C.section 360bbb-3(b)(1), unless the authorization is terminated  or revoked sooner.       Influenza A by PCR POSITIVE (A) NEGATIVE Final   Influenza B by PCR NEGATIVE NEGATIVE Final    Comment: (NOTE) The Xpert Xpress SARS-CoV-2/FLU/RSV plus assay is intended as an aid in  the diagnosis of influenza from Nasopharyngeal swab specimens and should not be used as a sole basis for treatment. Nasal washings and aspirates are unacceptable for Xpert Xpress SARS-CoV-2/FLU/RSV testing.  Fact Sheet for Patients: bloggercourse.com  Fact Sheet for Healthcare Providers: seriousbroker.it  This test is not yet approved or cleared by the [place]  FDA and has been authorized for detection and/or diagnosis of SARS-CoV-2 by FDA under an Emergency Use Authorization (EUA). This EUA will remain in effect (meaning this test can be used) for the duration of the COVID-19 declaration under Section 564(b)(1) of the Act, 21 U.S.C. section 360bbb-3(b)(1), unless the authorization is terminated or revoked.     Resp Syncytial Virus by PCR NEGATIVE NEGATIVE Final    Comment: (NOTE) Fact Sheet for Patients: bloggercourse.com  Fact Sheet for Healthcare Providers: seriousbroker.it  This test is not yet approved or cleared by the [place]  FDA and has been authorized for detection and/or diagnosis of SARS-CoV-2 by FDA under an Emergency Use Authorization (EUA). This EUA will remain in effect (meaning this test can be used) for the duration of the COVID-19 declaration under Section 564(b)(1) of the Act, 21 U.S.C. section 360bbb-3(b)(1), unless the  authorization is terminated or revoked.  Performed at [facility], [address] [place], [place] [address]   Urine Culture     Status: None   Collection Time: 07/02/24  5:41 AM   Specimen: Urine, Catheterized  Result Value Ref Range Status   Specimen Description   Final    URINE, CATHETERIZED Performed at [facility], [address] [place], [place] [address]    Special Requests   Final    NONE Performed at [facility], [address], [place], [place] [address]    Culture   Final    NO GROWTH Performed at [facility], [address] [place], [place] [address]    Report Status 07/03/2024 FINAL  Final     Scheduled Meds:  amitriptyline   10 mg Oral QHS   arformoterol   15 mcg Nebulization BID   aspirin  EC  81 mg Oral Daily   budesonide  (PULMICORT ) nebulizer solution  0.5 mg Nebulization BID   cholecalciferol   2,000 Units Oral q morning   clonazePAM   1 mg Oral QHS   FLUoxetine   20 mg Oral q morning   folic acid   1 mg Oral Daily   furosemide   40 mg Intravenous Daily   insulin  aspart  0-5 Units Subcutaneous QHS   insulin  aspart  0-9 Units Subcutaneous TID WC   [START ON 07/07/2024] insulin  aspart  3 Units Subcutaneous TID WC   ipratropium-albuterol   3 mL Nebulization BID   loratadine   10 mg Oral Daily   methylPREDNISolone  (SOLU-MEDROL ) injection  40 mg Intravenous Q12H   pantoprazole   80 mg Oral BH-q7a   pregabalin   75 mg Oral Daily   And   pregabalin   150 mg Oral QHS   simvastatin   20 mg Oral QHS   timolol   1 drop Both Eyes q morning   vitamin B-12  500 mcg Oral Daily   Continuous Infusions:    Procedures/Studies: ECHOCARDIOGRAM COMPLETE Result Date: 07/04/2024    ECHOCARDIOGRAM REPORT   Patient Name:   Chase Malone Date of Exam: 07/04/2024 Medical Rec #:  [id number]     Height:       66.0 in Accession #:    [id number]    Weight:       181.4 lb Date of Birth:  12-30-1945  BSA:          1.919 m Patient Age:    78 years      BP:           122/72 mmHg  Patient Gender: M             HR:           82 bpm. Exam Location:  [facility] Procedure: 2D Echo, Cardiac Doppler, Color Doppler and Strain Analysis (Both            Spectral and Color Flow Doppler were utilized during procedure). Indications:    CHF-Acute Diastolic I50.31  History:        Patient has prior history of Echocardiogram examinations, most                 recent 06/08/2019. CHF, Signs/Symptoms:Syncope; Risk                 Factors:Former Smoker, Diabetes, Dyslipidemia and Hypertension.  Sonographer:    Aida Pizza RCS Referring Phys: [phone] DAVID TAT  Sonographer Comments: Global longitudinal strain was attempted. IMPRESSIONS  1. Left ventricular ejection fraction, by estimation, is 60 to 65%. The left ventricle has normal function. The left ventricle has no regional wall motion abnormalities. Left ventricular diastolic parameters are indeterminate. The average left ventricular global longitudinal strain is -9.7 %.  2. Right ventricular systolic function is normal. The right ventricular size is normal. Tricuspid regurgitation signal is inadequate for assessing PA pressure.  3. The mitral valve is normal in structure. Mild mitral valve regurgitation. No evidence of mitral stenosis.  4. The aortic valve is normal in structure. Aortic valve regurgitation is not visualized. Aortic valve sclerosis/calcification is present, without any evidence of aortic stenosis. Aortic valve mean gradient measures 6.0 mmHg.  5. The inferior vena cava is dilated in size with >50% respiratory variability, suggesting right atrial pressure of 8 mmHg. Comparison(s): No significant change from prior study. FINDINGS  Left Ventricle: Left ventricular ejection fraction, by estimation, is 60 to 65%. The left ventricle has normal function. The left ventricle has no regional wall motion abnormalities. The average left ventricular global longitudinal strain is -9.7 %. Strain was performed and the global longitudinal strain is  indeterminate. The left ventricular internal cavity size was normal in size. There is no left ventricular hypertrophy. Left ventricular diastolic parameters are indeterminate. Right Ventricle: The right ventricular size is normal. No increase in right ventricular wall thickness. Right ventricular systolic function is normal. Tricuspid regurgitation signal is inadequate for assessing PA pressure. Left Atrium: Left atrial size was normal in size. Right Atrium: Right atrial size was normal in size. Pericardium: There is no evidence of pericardial effusion. Mitral Valve: The mitral valve is normal in structure. Mild mitral valve regurgitation. No evidence of mitral valve stenosis. Tricuspid Valve: The tricuspid valve is normal in structure. Tricuspid valve regurgitation is trivial. No evidence of tricuspid stenosis. Aortic Valve: The aortic valve is normal in structure. Aortic valve regurgitation is not visualized. Aortic valve sclerosis/calcification is present, without any evidence of aortic stenosis. Aortic valve mean gradient measures 6.0 mmHg. Aortic valve peak  gradient measures 11.8 mmHg. Aortic valve area, by VTI measures 1.91 cm. Pulmonic Valve: The pulmonic valve was not well visualized. Pulmonic valve regurgitation is not visualized. No evidence of pulmonic stenosis. Aorta: The aortic root is normal in size and structure. Venous: The inferior vena cava is dilated in size with greater than 50% respiratory variability, suggesting right atrial pressure  of 8 mmHg. IAS/Shunts: No atrial level shunt detected by color flow Doppler. Additional Comments: 3D was performed not requiring image post processing on an independent workstation and was indeterminate.  LEFT VENTRICLE PLAX 2D LVIDd:         5.00 cm      Diastology LVIDs:         3.30 cm      LV e' medial:    9.32 cm/s LV PW:         1.00 cm      LV E/e' medial:  11.7 LV IVS:        1.00 cm      LV e' lateral:   10.30 cm/s LVOT diam:     1.80 cm      LV E/e'  lateral: 10.6 LV SV:         67 LV SV Index:   35           2D Longitudinal Strain LVOT Area:     2.54 cm     2D Strain GLS Avg:     -9.7 %  LV Volumes (MOD) LV vol d, MOD A2C: 80.7 ml LV vol d, MOD A4C: 103.0 ml LV vol s, MOD A2C: 28.1 ml LV vol s, MOD A4C: 37.1 ml LV SV MOD A2C:     52.6 ml LV SV MOD A4C:     103.0 ml LV SV MOD BP:      60.9 ml RIGHT VENTRICLE RV S prime:     12.70 cm/s TAPSE (M-mode): 2.1 cm LEFT ATRIUM             Index        RIGHT ATRIUM           Index LA diam:        4.00 cm 2.08 cm/m   RA Area:     14.10 cm LA Vol (A2C):   56.4 ml 29.39 ml/m  RA Volume:   32.80 ml  17.09 ml/m LA Vol (A4C):   55.3 ml 28.82 ml/m LA Biplane Vol: 58.4 ml 30.44 ml/m  AORTIC VALVE AV Area (Vmax):    1.82 cm AV Area (Vmean):   2.07 cm AV Area (VTI):     1.91 cm AV Vmax:           172.00 cm/s AV Vmean:          105.000 cm/s AV VTI:            0.348 m AV Peak Grad:      11.8 mmHg AV Mean Grad:      6.0 mmHg LVOT Vmax:         123.00 cm/s LVOT Vmean:        85.600 cm/s LVOT VTI:          0.262 m LVOT/AV VTI ratio: 0.75  AORTA Ao Root diam: 3.30 cm MITRAL VALVE MV Area (PHT): 3.91 cm     SHUNTS MV Decel Time: 194 msec     Systemic VTI:  0.26 m MV E velocity: 109.00 cm/s  Systemic Diam: 1.80 cm MV A velocity: 99.00 cm/s MV E/A ratio:  1.10 Vishnu Priya Mallipeddi Electronically signed by Diannah Late Mallipeddi Signature Date/Time: 07/04/2024/3:53:59 PM    Final    CT CHEST WO CONTRAST Result Date: 07/04/2024 EXAM: CT CHEST WITHOUT CONTRAST 07/03/2024 03:00:33 PM TECHNIQUE: CT of the chest was performed without the administration of intravenous contrast. Multiplanar reformatted images are provided for review. Automated  exposure control, iterative reconstruction, and/or weight based adjustment of the mA/kV was utilized to reduce the radiation dose to as low as reasonably achievable. COMPARISON: None available. CLINICAL HISTORY: Respiratory illness, nondiagnostic xray. FINDINGS: MEDIASTINUM: Heart:  Extensive coronary artery calcification. Global cardiac size within normal limits. No pericardial effusion. Vessels: Central pulmonary arteries are of normal caliber. Mild atherosclerotic calcification within the thoracic aorta. No aortic aneurysm. Central airways: The central airways are clear. LYMPH NODES: No mediastinal, hilar or axillary lymphadenopathy. LUNGS AND PLEURA: Small bilateral pleural effusions with associated bibasilar compressive atelectasis. Trace interstitial pulmonary edema. Superimposed patchy ground glass and reticular nodular infiltrates are seen, most prevalent within the left lower lobe, suspicious for atypical infection in the appropriate clinical setting. No central obstructing lesion. No pneumothorax. SOFT TISSUES/BONES: No acute abnormality of the bones or soft tissues. UPPER ABDOMEN: Small hiatal hernia. Cirrhosis. Recanalized umbilical vein in keeping with changes of portal venous hypertension. Cholecystectomy and right nephrectomy have been performed. IMPRESSION: 1. Patchy ground-glass and reticulonodular infiltrates, suspicious for atypical infection. 2. Extensive coronary artery calcification. 3. Small bilateral pleural effusions with bibasilar compressive atelectasis. Trace interstitial pulmonary edema. Together, the findings may reflect changes of mild cardiogenic failure. 4. Cirrhosis with recanalized umbilical vein, consistent with portal hypertension. 5. Small hiatal hernia. Electronically signed by: Dorethia Molt MD 07/04/2024 02:06 AM EST RP Workstation: [id number]   CT HEAD WO CONTRAST Result Date: 07/02/2024 EXAM: CT HEAD WITHOUT CONTRAST 07/02/2024 05:58:10 AM TECHNIQUE: CT of the head was performed without the administration of intravenous contrast. Automated exposure control, iterative reconstruction, and/or weight based adjustment of the mA/kV was utilized to reduce the radiation dose to as low as reasonably achievable. COMPARISON: Head CT 06/15/2010. CLINICAL  HISTORY: 78 year old male. Fall at home. Increased confusion from baseline. FINDINGS: BRAIN AND VENTRICLES: Background brain volume remains normal for age. 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 Mild heterogeneity elsewhere in the bilateral basal ganglia (right caudate series 2 image 35 which also appears likely to be chronic). Gray white differentiation otherwise normal for age. No acute hemorrhage. No evidence of acute infarct. No hydrocephalus. No extra-axial collection. No mass effect or midline shift. Calcified atherosclerosis at the skull base. No suspicious intracranial vascular hyperdensity. ORBITS: Postoperative changes to both globes since 2011. No acute orbit injury identified. SINUSES: Trace low density fluid in the right sphenoid sinus appears inflammatory, while other visible paranasal sinuses, tympanic cavities and mastoids appear clear. SOFT TISSUES AND SKULL: No acute scalp soft tissue injury identified. No skull fracture. IMPRESSION: 1. 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 2. No acute intracranial abnormality.  No acute traumatic injury identified. Electronically signed by: Helayne Hurst MD 07/02/2024 06:04 AM EST RP Workstation: [id number]   DG Chest Port 1 View Result Date: 07/02/2024 EXAM: 1 VIEW(S) XRAY OF THE CHEST 07/02/2024 05:27:54 AM COMPARISON: Portable chest x ray 06/14/2010. CLINICAL HISTORY: 78 year old male with questionable sepsis, evaluate for abnormality. FINDINGS: LUNGS AND PLEURA: Mildly lower lung volumes. Mild diffuse, symmetric increased pulmonary interstitial markings. No consolidation. No convincing confluent lung opacity. No pleural effusion. No pneumothorax. HEART AND MEDIASTINUM: Mediastinal contours remain normal. Negative visible trachea. BONES AND SOFT TISSUES: No acute osseous abnormality. VISIBLE ABDOMEN: Paucity of bowel gas in the visible abdomen. Cholecystectomy  clips suspected. IMPRESSION: 1. Mild diffuse interstitial opacity. Favor vascular congestion / interstitial edema, but viral/atypical respiratory infection could appear similar. Electronically signed by: Helayne Hurst MD 07/02/2024 05:31 AM EST  RP Workstation: [id number]    Eric Nunnery, MD  [facility]  If 7PM-7AM, please contact night-coverage [email or web address] Password [facility] 07/06/2024, 6:36 PM   LOS: 4 days   "

## 2024-07-06 NOTE — Plan of Care (Signed)

## 2024-07-07 DIAGNOSIS — N1832 Chronic kidney disease, stage 3b: Secondary | ICD-10-CM | POA: Diagnosis not present

## 2024-07-07 DIAGNOSIS — J101 Influenza due to other identified influenza virus with other respiratory manifestations: Secondary | ICD-10-CM | POA: Diagnosis not present

## 2024-07-07 DIAGNOSIS — R5381 Other malaise: Secondary | ICD-10-CM

## 2024-07-07 DIAGNOSIS — J9601 Acute respiratory failure with hypoxia: Secondary | ICD-10-CM | POA: Diagnosis not present

## 2024-07-07 DIAGNOSIS — A419 Sepsis, unspecified organism: Secondary | ICD-10-CM | POA: Diagnosis not present

## 2024-07-07 LAB — GLUCOSE, CAPILLARY
Glucose-Capillary: 217 mg/dL — ABNORMAL HIGH (ref 70–99)
Glucose-Capillary: 251 mg/dL — ABNORMAL HIGH (ref 70–99)
Glucose-Capillary: 307 mg/dL — ABNORMAL HIGH (ref 70–99)
Glucose-Capillary: 164 mg/dL — ABNORMAL HIGH (ref 70–99)

## 2024-07-07 LAB — CULTURE, BLOOD (ROUTINE X 2)
Culture: NO GROWTH
Culture: NO GROWTH

## 2024-07-07 NOTE — Plan of Care (Signed)

## 2024-07-07 NOTE — Plan of Care (Signed)
   Problem: Clinical Measurements: Goal: Ability to maintain clinical measurements within normal limits will improve Outcome: Progressing   Problem: Clinical Measurements: Goal: Will remain free from infection Outcome: Progressing   Problem: Clinical Measurements: Goal: Diagnostic test results will improve Outcome: Progressing

## 2024-07-07 NOTE — Progress Notes (Signed)
 "          PROGRESS NOTE  Chase Malone FMW:[id number] DOB: 01-12-1946 DOA: 07/02/2024 PCP: [facility]  Brief History:  79 year old male with a history of diabetes mellitus type 2, hypertension, hyperlipidemia, CKD stage III, mild cognitive impairment, anxiety/depression presenting with at least 1 week history of generalized weakness, coughing, shortness of breath, chest congestion.  He states that his cough is mostly clear sputum.  Wife is at the bedside to supplement history.  The patient was trying to go to the bathroom to shave.  He required the help of his grandson as the patient was weak and unsteady.  Wife states that he has been having difficulty getting out of bed because of increasing generalized weakness.  Subsequently, the patient made his way back to the bedroom when he fell.  Fortunately, the patient's grandson and wife were near him and were able to ease him onto the floor.  The patient was found on the floor by EMS.  He seemed confused.  Patient was noted to have oxygen saturation in the low 90s.  He was placed on 3 L by EMS. Wife states that the patient has had a functional decline over the past 2 months.  He has had decreased oral intake for the past week.  He had 2 falls this past week.  She states that he has not been placed on any new medications.  There is not been any complaints of chest pain, hemoptysis, nausea, vomiting, diarrhea, abdominal pain, dysuria, hematuria.   In the ED, the patient was febrile up to 100.7 F.  He was hemodynamically stable.  Oxygen saturation was 90% on room air.  He was placed on 2 L with oxygen up to 95%.  WBC 6.7, hemoglobin 1.6, platelets 100,000.  Sodium 135, potassium 4.3, bicarbonate 21, serum creatinine 1.89.  AST 37, ALT 15, alk phosphatase 68, total bilirubin 0.9.  Lactic acid 3.0>> 3.4.  VBG showed 7.4 5/34/56/23.  Chest x-ray showed mild diffuse interstitial opacities.  CT brain was negative for any acute findings.  Influenza A  was positive.  Ammonia 51.  EKG showed sinus rhythm nonspecific ST changes.  The patient was started on oseltamavir, vancomycin , cefepime .  He was given 3 L of LR bolus.    Assessment/Plan:  Sepsis  - Present on admission - Presented with fever and tachycardia - Secondary to influenza - UA negative for pyuria - Follow blood cultures--neg to date - Chest x-ray with bilateral interstitial opacities - sepsis physiology resolved and antibiotic therapy completed.. - Continue to maintain adequate oral hydration and continue supportive care.   Acute HFpEF - Continue daily weight. - 12/29 Echo EF 60-65%, no WMA, , normal RVF, mild MR - continue to obtain ReDS vest reading -d/c pioglitazone -Low-sodium diet has been discussed with patient - Continue IV diuresis and follow strict intake and output.   Pulmonary infiltrates - secondary to influenza and pulm edema - Check PCT 0.40 - continue empiric ceftriaxone /azithro--finish 5 days on 12/31 -12/29 CT chest--Patchy ground-glass and reticulonodular infiltrates, suspicious for atypical infection; Small bilateral pleural effusions with bibasilar compressive atelectasis. Trace interstitial pulmonary edema   Influenza pneumonitis - Has completed treatment with Tamiflu . - Continue bronchodilators - Continue supportive care, antipyretics and steroids tapering. -Patient is hemodynamically stable and ready for discharge to skilled nursing facility.   CKD stage IIIb - Baseline creatinine 1.6-1.8   Thrombocytopenia - Secondary to infectious process - TSH--2.040 - B12--251 - Folic acid --3.5   Lactic acidosis -  initially on IV fluids>>stopped with fluid overload   Generalized weakness - PT evaluation>>SNF - Workup as above -Per TOC report patient most likely amenable for skilled nursing facility discharge based on influenza diagnosis on 07/11/2024   Diabetes mellitus type 2 - Holding metformin - Check hemoglobin A1c 6.1 - NovoLog  sliding  scale -CBGs elevated in the setting of his steroids usage - Continue to follow fluctuation and adjust hypoglycemic regimen as needed -NovoLog  meal coverage has been added.   Mixed hyperlipidemia - Continue statin   Depression/anxiety - Continue fluoxetine  and clonazepam        Family Communication:   No family at bedside at time of evaluation..   Consultants:  none   Code Status:  FULL    DVT Prophylaxis:  SCDs     Procedures: As Listed in Progress Note Above   Antibiotics: Ceftriaxone  12/27>>12/31 Azithro 12/27>>12/31    Subjective: No fever, no chest pain, no nausea, no vomiting.  Patient reports generalized weakness and deconditioning.  No overnight events reported.  Hemodynamically stable.  Objective: Vitals:   07/06/24 1922 07/07/24 0440 07/07/24 0624 07/07/24 1407  BP: 125/74 133/75  109/63  Pulse: 77 73    Resp: 16 14  18   Temp: 97.6 F (36.4 C) 97.8 F (36.6 C)  (!) 97.5 F (36.4 C)  TempSrc: Oral Oral  Oral  SpO2: 98% 93% 96% 98%  Weight:  85.1 kg    Height:        Intake/Output Summary (Last 24 hours) at 07/07/2024 1458 Last data filed at 07/07/2024 1407 Gross per 24 hour  Intake 690 ml  Output 1550 ml  Net -860 ml   Weight change: -1.4 kg  Exam: General exam: Alert, awake, following commands appropriately and in no acute distress.  No overnight events reported. Respiratory system: Good saturation on room air.  No using accessory muscles. Cardiovascular system: Rate controlled, no rubs, no gallops, no JVD. Gastrointestinal system: Abdomen is nondistended, soft and nontender. No organomegaly or masses felt. Normal bowel sounds heard. Central nervous system: Generally weak.  No focal neurological deficits. Extremities: No cyanosis or clubbing. Skin: No petechiae. Psychiatry: Judgement and insight appear normal. Mood & affect appropriate.   Data Reviewed: I have personally reviewed following labs and imaging studies  Basic Metabolic  Panel: Recent Labs  Lab 07/02/24 0513 07/03/24 0437 07/04/24 0501 07/05/24 0955 07/06/24 0509  NA 135 139 139 139 137  K 4.3 3.9 4.1 3.6 3.7  CL 101 100 102 100 100  CO2 21* 22 27 21* 22  GLUCOSE 155* 168* 179* 198* 218*  BUN 13 17 26* 32* 32*  CREATININE 1.89* 1.97* 1.91* 1.69* 1.56*  CALCIUM  8.9 8.8* 8.6* 8.9 8.6*  MG  --  2.0 2.2  --  2.4   Liver Function Tests: Recent Labs  Lab 07/02/24 0513  AST 37  ALT 15  ALKPHOS 68  BILITOT 0.9  PROT 6.1*  ALBUMIN  3.5    Recent Labs  Lab 07/02/24 0534  AMMONIA 51*   Coagulation Profile: Recent Labs  Lab 07/02/24 0513  INR 1.2   CBC: Recent Labs  Lab 07/02/24 0513 07/03/24 0437  WBC 6.7 5.0  NEUTROABS 5.6  --   HGB 11.6* 11.1*  HCT 35.5* 33.6*  MCV 94.4 94.6  PLT 100* 92*   CBG: Recent Labs  Lab 07/06/24 1125 07/06/24 1639 07/06/24 2011 07/07/24 0727 07/07/24 1200  GLUCAP 302* 346* 261* 217* 251*   HbA1C: No results for input(s): HGBA1C in the last  72 hours.  Urine analysis:    Component Value Date/Time   COLORURINE YELLOW 07/02/2024 0541   APPEARANCEUR CLEAR 07/02/2024 0541   APPEARANCEUR Clear 05/09/2021 1023   LABSPEC 1.019 07/02/2024 0541   PHURINE 5.0 07/02/2024 0541   GLUCOSEU NEGATIVE 07/02/2024 0541   HGBUR MODERATE (A) 07/02/2024 0541   BILIRUBINUR NEGATIVE 07/02/2024 0541   BILIRUBINUR Negative 05/09/2021 1023   KETONESUR 5 (A) 07/02/2024 0541   PROTEINUR 30 (A) 07/02/2024 0541   NITRITE NEGATIVE 07/02/2024 0541   LEUKOCYTESUR NEGATIVE 07/02/2024 0541   Sepsis Labs: @LABRCNTIP (procalcitonin:4,lacticidven:4) ) Recent Results (from the past 240 hours)  Blood culture (routine x 2)     Status: None   Collection Time: 07/02/24  5:13 AM   Specimen: BLOOD RIGHT FOREARM  Result Value Ref Range Status   Specimen Description BLOOD RIGHT FOREARM  Final   Special Requests   Final    BOTTLES DRAWN AEROBIC AND ANAEROBIC Blood Culture results may not be optimal due to an inadequate  volume of blood received in culture bottles   Culture   Final    NO GROWTH 5 DAYS Performed at [facility], [address], [place], [place] [address]    Report Status 07/07/2024 FINAL  Final  Blood culture (routine x 2)     Status: None   Collection Time: 07/02/24  5:13 AM   Specimen: Left Antecubital; Blood  Result Value Ref Range Status   Specimen Description LEFT ANTECUBITAL  Final   Special Requests   Final    BOTTLES DRAWN AEROBIC AND ANAEROBIC Blood Culture results may not be optimal due to an inadequate volume of blood received in culture bottles   Culture   Final    NO GROWTH 5 DAYS Performed at [facility], [address] [place], [place] [address]    Report Status 07/07/2024 FINAL  Final  Resp panel by RT-PCR (RSV, Flu A&B, Covid) Anterior Nasal Swab     Status: Abnormal   Collection Time: 07/02/24  5:13 AM   Specimen: Anterior Nasal Swab  Result Value Ref Range Status   SARS Coronavirus 2 by RT PCR NEGATIVE NEGATIVE Final    Comment: (NOTE) SARS-CoV-2 target nucleic acids are NOT DETECTED.  The SARS-CoV-2 RNA is generally detectable in upper respiratory specimens during the acute phase of infection. The lowest concentration of SARS-CoV-2 viral copies this assay can detect is 138 copies/mL. A negative result does not preclude SARS-Cov-2 infection and should not be used as the sole basis for treatment or other patient management decisions. A negative result may occur with  improper specimen collection/handling, submission of specimen other than nasopharyngeal swab, presence of viral mutation(s) within the areas targeted by this assay, and inadequate number of viral copies(<138 copies/mL). A negative result must be combined with clinical observations, patient history, and epidemiological information. The expected result is Negative.  Fact Sheet for Patients:  bloggercourse.com  Fact Sheet for Healthcare Providers:   seriousbroker.it  This test is no t yet approved or cleared by the [place]  FDA and  has been authorized for detection and/or diagnosis of SARS-CoV-2 by FDA under an Emergency Use Authorization (EUA). This EUA will remain  in effect (meaning this test can be used) for the duration of the COVID-19 declaration under Section 564(b)(1) of the Act, 21 U.S.C.section 360bbb-3(b)(1), unless the authorization is terminated  or revoked sooner.       Influenza A by PCR POSITIVE (A) NEGATIVE Final   Influenza B by PCR NEGATIVE NEGATIVE Final  Comment: (NOTE) The Xpert Xpress SARS-CoV-2/FLU/RSV plus assay is intended as an aid in the diagnosis of influenza from Nasopharyngeal swab specimens and should not be used as a sole basis for treatment. Nasal washings and aspirates are unacceptable for Xpert Xpress SARS-CoV-2/FLU/RSV testing.  Fact Sheet for Patients: bloggercourse.com  Fact Sheet for Healthcare Providers: seriousbroker.it  This test is not yet approved or cleared by the [place]  FDA and has been authorized for detection and/or diagnosis of SARS-CoV-2 by FDA under an Emergency Use Authorization (EUA). This EUA will remain in effect (meaning this test can be used) for the duration of the COVID-19 declaration under Section 564(b)(1) of the Act, 21 U.S.C. section 360bbb-3(b)(1), unless the authorization is terminated or revoked.     Resp Syncytial Virus by PCR NEGATIVE NEGATIVE Final    Comment: (NOTE) Fact Sheet for Patients: bloggercourse.com  Fact Sheet for Healthcare Providers: seriousbroker.it  This test is not yet approved or cleared by the [place]  FDA and has been authorized for detection and/or diagnosis of SARS-CoV-2 by FDA under an Emergency Use Authorization (EUA). This EUA will remain in effect (meaning this test can be used)  for the duration of the COVID-19 declaration under Section 564(b)(1) of the Act, 21 U.S.C. section 360bbb-3(b)(1), unless the authorization is terminated or revoked.  Performed at [facility], [address], [place], [place] [address]   Urine Culture     Status: None   Collection Time: 07/02/24  5:41 AM   Specimen: Urine, Catheterized  Result Value Ref Range Status   Specimen Description   Final    URINE, CATHETERIZED Performed at [facility], [address], [place], [place] [address]    Special Requests   Final    NONE Performed at [facility], [address] [place], [place] [address]    Culture   Final    NO GROWTH Performed at [facility], [address], [place], [place] [address]    Report Status 07/03/2024 FINAL  Final     Scheduled Meds:  amitriptyline   10 mg Oral QHS   arformoterol   15 mcg Nebulization BID   aspirin  EC  81 mg Oral Daily   budesonide  (PULMICORT ) nebulizer solution  0.5 mg Nebulization BID   cholecalciferol   2,000 Units Oral q morning   clonazePAM   1 mg Oral QHS   FLUoxetine   20 mg Oral q morning   folic acid   1 mg Oral Daily   furosemide   40 mg Intravenous Daily   insulin  aspart  0-5 Units Subcutaneous QHS   insulin  aspart  0-9 Units Subcutaneous TID WC   insulin  aspart  3 Units Subcutaneous TID WC   ipratropium-albuterol   3 mL Nebulization BID   loratadine   10 mg Oral Daily   methylPREDNISolone  (SOLU-MEDROL ) injection  40 mg Intravenous Q12H   pantoprazole   80 mg Oral BH-q7a   pregabalin   75 mg Oral Daily   And   pregabalin   150 mg Oral QHS   simvastatin   20 mg Oral QHS   timolol   1 drop Both Eyes q morning   vitamin B-12  500 mcg Oral Daily   Continuous Infusions: None currently  Procedures/Studies: ECHOCARDIOGRAM COMPLETE Result Date: 07/04/2024    ECHOCARDIOGRAM REPORT   Patient Name:   KEMAURI MUSA Date of Exam: 07/04/2024 Medical Rec #:  [id number]     Height:       66.0 in Accession #:    [id number]    Weight:  181.4 lb Date of Birth:  14-Feb-1946     BSA:          1.919 m Patient Age:    78 years      BP:           122/72 mmHg Patient Gender: M             HR:           82 bpm. Exam Location:  [facility] Procedure: 2D Echo, Cardiac Doppler, Color Doppler and Strain Analysis (Both            Spectral and Color Flow Doppler were utilized during procedure). Indications:    CHF-Acute Diastolic I50.31  History:        Patient has prior history of Echocardiogram examinations, most                 recent 06/08/2019. CHF, Signs/Symptoms:Syncope; Risk                 Factors:Former Smoker, Diabetes, Dyslipidemia and Hypertension.  Sonographer:    Aida Pizza RCS Referring Phys: [phone] DAVID TAT  Sonographer Comments: Global longitudinal strain was attempted. IMPRESSIONS  1. Left ventricular ejection fraction, by estimation, is 60 to 65%. The left ventricle has normal function. The left ventricle has no regional wall motion abnormalities. Left ventricular diastolic parameters are indeterminate. The average left ventricular global longitudinal strain is -9.7 %.  2. Right ventricular systolic function is normal. The right ventricular size is normal. Tricuspid regurgitation signal is inadequate for assessing PA pressure.  3. The mitral valve is normal in structure. Mild mitral valve regurgitation. No evidence of mitral stenosis.  4. The aortic valve is normal in structure. Aortic valve regurgitation is not visualized. Aortic valve sclerosis/calcification is present, without any evidence of aortic stenosis. Aortic valve mean gradient measures 6.0 mmHg.  5. The inferior vena cava is dilated in size with >50% respiratory variability, suggesting right atrial pressure of 8 mmHg. Comparison(s): No significant change from prior study. FINDINGS  Left Ventricle: Left ventricular ejection fraction, by estimation, is 60 to 65%. The left ventricle has normal function. The left ventricle has no regional wall motion abnormalities. The average left  ventricular global longitudinal strain is -9.7 %. Strain was performed and the global longitudinal strain is indeterminate. The left ventricular internal cavity size was normal in size. There is no left ventricular hypertrophy. Left ventricular diastolic parameters are indeterminate. Right Ventricle: The right ventricular size is normal. No increase in right ventricular wall thickness. Right ventricular systolic function is normal. Tricuspid regurgitation signal is inadequate for assessing PA pressure. Left Atrium: Left atrial size was normal in size. Right Atrium: Right atrial size was normal in size. Pericardium: There is no evidence of pericardial effusion. Mitral Valve: The mitral valve is normal in structure. Mild mitral valve regurgitation. No evidence of mitral valve stenosis. Tricuspid Valve: The tricuspid valve is normal in structure. Tricuspid valve regurgitation is trivial. No evidence of tricuspid stenosis. Aortic Valve: The aortic valve is normal in structure. Aortic valve regurgitation is not visualized. Aortic valve sclerosis/calcification is present, without any evidence of aortic stenosis. Aortic valve mean gradient measures 6.0 mmHg. Aortic valve peak  gradient measures 11.8 mmHg. Aortic valve area, by VTI measures 1.91 cm. Pulmonic Valve: The pulmonic valve was not well visualized. Pulmonic valve regurgitation is not visualized. No evidence of pulmonic stenosis. Aorta: The aortic root is normal in size and structure. Venous: The inferior vena cava is dilated in size  with greater than 50% respiratory variability, suggesting right atrial pressure of 8 mmHg. IAS/Shunts: No atrial level shunt detected by color flow Doppler. Additional Comments: 3D was performed not requiring image post processing on an independent workstation and was indeterminate.  LEFT VENTRICLE PLAX 2D LVIDd:         5.00 cm      Diastology LVIDs:         3.30 cm      LV e' medial:    9.32 cm/s LV PW:         1.00 cm      LV E/e'  medial:  11.7 LV IVS:        1.00 cm      LV e' lateral:   10.30 cm/s LVOT diam:     1.80 cm      LV E/e' lateral: 10.6 LV SV:         67 LV SV Index:   35           2D Longitudinal Strain LVOT Area:     2.54 cm     2D Strain GLS Avg:     -9.7 %  LV Volumes (MOD) LV vol d, MOD A2C: 80.7 ml LV vol d, MOD A4C: 103.0 ml LV vol s, MOD A2C: 28.1 ml LV vol s, MOD A4C: 37.1 ml LV SV MOD A2C:     52.6 ml LV SV MOD A4C:     103.0 ml LV SV MOD BP:      60.9 ml RIGHT VENTRICLE RV S prime:     12.70 cm/s TAPSE (M-mode): 2.1 cm LEFT ATRIUM             Index        RIGHT ATRIUM           Index LA diam:        4.00 cm 2.08 cm/m   RA Area:     14.10 cm LA Vol (A2C):   56.4 ml 29.39 ml/m  RA Volume:   32.80 ml  17.09 ml/m LA Vol (A4C):   55.3 ml 28.82 ml/m LA Biplane Vol: 58.4 ml 30.44 ml/m  AORTIC VALVE AV Area (Vmax):    1.82 cm AV Area (Vmean):   2.07 cm AV Area (VTI):     1.91 cm AV Vmax:           172.00 cm/s AV Vmean:          105.000 cm/s AV VTI:            0.348 m AV Peak Grad:      11.8 mmHg AV Mean Grad:      6.0 mmHg LVOT Vmax:         123.00 cm/s LVOT Vmean:        85.600 cm/s LVOT VTI:          0.262 m LVOT/AV VTI ratio: 0.75  AORTA Ao Root diam: 3.30 cm MITRAL VALVE MV Area (PHT): 3.91 cm     SHUNTS MV Decel Time: 194 msec     Systemic VTI:  0.26 m MV E velocity: 109.00 cm/s  Systemic Diam: 1.80 cm MV A velocity: 99.00 cm/s MV E/A ratio:  1.10 Vishnu Priya Mallipeddi Electronically signed by Diannah Late Mallipeddi Signature Date/Time: 07/04/2024/3:53:59 PM    Final    CT CHEST WO CONTRAST Result Date: 07/04/2024 EXAM: CT CHEST WITHOUT CONTRAST 07/03/2024 03:00:33 PM TECHNIQUE: CT of the chest was performed without the administration of  intravenous contrast. Multiplanar reformatted images are provided for review. Automated exposure control, iterative reconstruction, and/or weight based adjustment of the mA/kV was utilized to reduce the radiation dose to as low as reasonably achievable. COMPARISON: None  available. CLINICAL HISTORY: Respiratory illness, nondiagnostic xray. FINDINGS: MEDIASTINUM: Heart: Extensive coronary artery calcification. Global cardiac size within normal limits. No pericardial effusion. Vessels: Central pulmonary arteries are of normal caliber. Mild atherosclerotic calcification within the thoracic aorta. No aortic aneurysm. Central airways: The central airways are clear. LYMPH NODES: No mediastinal, hilar or axillary lymphadenopathy. LUNGS AND PLEURA: Small bilateral pleural effusions with associated bibasilar compressive atelectasis. Trace interstitial pulmonary edema. Superimposed patchy ground glass and reticular nodular infiltrates are seen, most prevalent within the left lower lobe, suspicious for atypical infection in the appropriate clinical setting. No central obstructing lesion. No pneumothorax. SOFT TISSUES/BONES: No acute abnormality of the bones or soft tissues. UPPER ABDOMEN: Small hiatal hernia. Cirrhosis. Recanalized umbilical vein in keeping with changes of portal venous hypertension. Cholecystectomy and right nephrectomy have been performed. IMPRESSION: 1. Patchy ground-glass and reticulonodular infiltrates, suspicious for atypical infection. 2. Extensive coronary artery calcification. 3. Small bilateral pleural effusions with bibasilar compressive atelectasis. Trace interstitial pulmonary edema. Together, the findings may reflect changes of mild cardiogenic failure. 4. Cirrhosis with recanalized umbilical vein, consistent with portal hypertension. 5. Small hiatal hernia. Electronically signed by: Dorethia Molt MD 07/04/2024 02:06 AM EST RP Workstation: [id number]   CT HEAD WO CONTRAST Result Date: 07/02/2024 EXAM: CT HEAD WITHOUT CONTRAST 07/02/2024 05:58:10 AM TECHNIQUE: CT of the head was performed without the administration of intravenous contrast. Automated exposure control, iterative reconstruction, and/or weight based adjustment of the mA/kV was utilized to reduce  the radiation dose to as low as reasonably achievable. COMPARISON: Head CT 06/15/2010. CLINICAL HISTORY: 79 year old male. Fall at home. Increased confusion from baseline. FINDINGS: BRAIN AND VENTRICLES: Background brain volume remains normal for age. 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 Mild heterogeneity elsewhere in the bilateral basal ganglia (right caudate series 2 image 35 which also appears likely to be chronic). Gray white differentiation otherwise normal for age. No acute hemorrhage. No evidence of acute infarct. No hydrocephalus. No extra-axial collection. No mass effect or midline shift. Calcified atherosclerosis at the skull base. No suspicious intracranial vascular hyperdensity. ORBITS: Postoperative changes to both globes since 2011. No acute orbit injury identified. SINUSES: Trace low density fluid in the right sphenoid sinus appears inflammatory, while other visible paranasal sinuses, tympanic cavities and mastoids appear clear. SOFT TISSUES AND SKULL: No acute scalp soft tissue injury identified. No skull fracture. IMPRESSION: 1. 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 2. No acute intracranial abnormality.  No acute traumatic injury identified. Electronically signed by: Helayne Hurst MD 07/02/2024 06:04 AM EST RP Workstation: [id number]   DG Chest Port 1 View Result Date: 07/02/2024 EXAM: 1 VIEW(S) XRAY OF THE CHEST 07/02/2024 05:27:54 AM COMPARISON: Portable chest x ray 06/14/2010. CLINICAL HISTORY: 79 year old male with questionable sepsis, evaluate for abnormality. FINDINGS: LUNGS AND PLEURA: Mildly lower lung volumes. Mild diffuse, symmetric increased pulmonary interstitial markings. No consolidation. No convincing confluent lung opacity. No pleural effusion. No pneumothorax. HEART AND MEDIASTINUM: Mediastinal contours remain normal. Negative visible trachea. BONES AND SOFT TISSUES: No acute  osseous abnormality. VISIBLE ABDOMEN: Paucity of bowel gas in the visible abdomen. Cholecystectomy clips suspected. IMPRESSION: 1. Mild diffuse interstitial opacity. Favor vascular congestion / interstitial edema, but viral/atypical respiratory infection could appear similar.  Electronically signed by: Helayne Hurst MD 07/02/2024 05:31 AM EST RP Workstation: [id number]    Eric Nunnery, MD  [facility]  If 7PM-7AM, please contact night-coverage [email or web address] Password [email or web address] 07/07/2024, 2:58 PM   LOS: 5 days   "

## 2024-07-08 DIAGNOSIS — J9601 Acute respiratory failure with hypoxia: Secondary | ICD-10-CM | POA: Diagnosis not present

## 2024-07-08 DIAGNOSIS — A419 Sepsis, unspecified organism: Secondary | ICD-10-CM | POA: Diagnosis not present

## 2024-07-08 DIAGNOSIS — N1832 Chronic kidney disease, stage 3b: Secondary | ICD-10-CM | POA: Diagnosis not present

## 2024-07-08 DIAGNOSIS — J101 Influenza due to other identified influenza virus with other respiratory manifestations: Secondary | ICD-10-CM | POA: Diagnosis not present

## 2024-07-08 LAB — GLUCOSE, CAPILLARY
Glucose-Capillary: 209 mg/dL — ABNORMAL HIGH (ref 70–99)
Glucose-Capillary: 274 mg/dL — ABNORMAL HIGH (ref 70–99)
Glucose-Capillary: 211 mg/dL — ABNORMAL HIGH (ref 70–99)
Glucose-Capillary: 243 mg/dL — ABNORMAL HIGH (ref 70–99)

## 2024-07-08 MED ORDER — FUROSEMIDE 40 MG PO TABS
40.0000 mg | ORAL_TABLET | Freq: Two times a day (BID) | ORAL | Status: DC
  Administered 2024-07-09 – 2024-07-11 (×5): 40 mg via ORAL
  Filled 2024-07-08 (×5): qty 1

## 2024-07-08 MED ORDER — PREDNISONE 20 MG PO TABS
60.0000 mg | ORAL_TABLET | Freq: Every day | ORAL | Status: DC
  Administered 2024-07-09 – 2024-07-10 (×2): 60 mg via ORAL
  Filled 2024-07-08 (×2): qty 3

## 2024-07-08 NOTE — Plan of Care (Signed)

## 2024-07-08 NOTE — Progress Notes (Signed)
 Physical Therapy Treatment Patient Details Name: Chase Malone MRN: [id number] DOB: 14-Oct-1945 Today's Date: 07/08/2024   History of Present Illness Chase Malone is a 79 year old male with a history of diabetes mellitus type 2, hypertension, hyperlipidemia, CKD stage III, mild cognitive impairment, anxiety/depression presenting with at least 1 week history of generalized weakness, coughing, shortness of breath, chest congestion.  He states that his cough is mostly clear sputum.  Wife is at the bedside to supplement history.  The patient was trying to go to the bathroom to shave.  He required the help of his grandson as the patient was weak and unsteady.  Wife states that he has been having difficulty getting out of bed because of increasing generalized weakness.  Subsequently, the patient made his way back to the bedroom when he fell.  Fortunately, the patient's grandson and wife were near him and were able to ease him onto the floor.  The patient was found on the floor by EMS.  He seemed confused.  Patient was noted to have oxygen saturation in the low 90s.  He was placed on 3 L by EMS.  Wife states that the patient has had a functional decline over the past 2 months.  He has had decreased oral intake for the past week.  He had 2 falls this past week.  She states that he has not been placed on any new medications.  There is not been any complaints of chest pain, hemoptysis, nausea, vomiting, diarrhea, abdominal pain, dysuria, hematuria.    PT Comments  Patient continues to demonstrate decreased LE strength, decreased gait quality and balance. Patient also demonstrates decreased endurance with aerobic based exercise during today's session although O2 saturation levels remain within therapeutic range (96% O2 on room air following session). Patient able to  progress dynamic balance and core activation exercises today with increased walking volume and basic therapeutic exercise, good performance with verbal  cueing. Patient would continue to benefit from skilled physical therapy for increased endurance with ambulation, increased LE strength, and improved balance for improved quality of life, improved independence with gait training and continued progress towards therapy goals.     If plan is discharge home, recommend the following:     Can travel by private vehicle        Equipment Recommendations       Recommendations for Other Services       Precautions / Restrictions Precautions Precautions: Fall Recall of Precautions/Restrictions: Intact Restrictions Weight Bearing Restrictions Per Provider Order: No     Mobility  Bed Mobility Overal bed mobility: Needs Assistance Bed Mobility: Supine to Sit     Supine to sit: Mod assist, Min assist     General bed mobility comments: HOB elevated, slow labored movement, use of railings, assisted with trunk elevation and scotting bottom towards EOB once sitting up    Transfers Overall transfer level: Needs assistance Equipment used: Rolling walker (2 wheels) Transfers: Sit to/from Stand, Bed to chair/wheelchair/BSC Sit to Stand: Contact guard assist   Step pivot transfers: Contact guard assist       General transfer comment: CGA with STS from bed with RW due to LE weakness, slow labored movement, short steps during transfer with flexed trunk, generally unsteady    Ambulation/Gait Ambulation/Gait assistance: Contact guard assist Gait Distance (Feet): 18 Feet Assistive device: Rolling walker (2 wheels) Gait Pattern/deviations: Step-to pattern, Decreased step length - right, Decreased step length - left, Decreased stride length, Trunk flexed, Narrow base of support Gait  velocity: decreased     General Gait Details: Pt able to ambulate with RW with CGA with minal drop in O2 level   Stairs             Wheelchair Mobility     Tilt Bed    Modified Rankin (Stroke Patients Only)       Balance Overall balance  assessment: Needs assistance Sitting-balance support: Feet supported, Bilateral upper extremity supported Sitting balance-Leahy Scale: Fair Sitting balance - Comments: seated EOB   Standing balance support: During functional activity, Reliant on assistive device for balance, Bilateral upper extremity supported Standing balance-Leahy Scale: Fair Standing balance comment: w/ RW                            Communication Communication Communication: No apparent difficulties  Cognition Arousal: Alert Behavior During Therapy: WFL for tasks assessed/performed   PT - Cognitive impairments: No apparent impairments                         Following commands: Intact      Cueing Cueing Techniques: Verbal cues, Visual cues  Exercises General Exercises - Lower Extremity Long Arc Quad: AROM, 10 reps, Seated Hip Flexion/Marching: AROM, Standing, 20 reps Heel Raises: AROM, 20 reps, Standing    General Comments        Pertinent Vitals/Pain Pain Assessment Pain Assessment: Faces Faces Pain Scale: Hurts little more Pain Location: stomach Pain Intervention(s): Limited activity within patient's tolerance    Home Living                          Prior Function            PT Goals (current goals can now be found in the care plan section) Acute Rehab PT Goals Patient Stated Goal: Return home PT Goal Formulation: With patient Time For Goal Achievement: 07/22/24 Potential to Achieve Goals: Good Progress towards PT goals: Progressing toward goals    Frequency    Min 3X/week      PT Plan      Co-evaluation              AM-PAC PT 6 Clicks Mobility   Outcome Measure                   End of Session Equipment Utilized During Treatment: Gait belt Activity Tolerance: Patient tolerated treatment well;Patient limited by fatigue Patient left: in chair;with call bell/phone within reach Nurse Communication: Mobility status PT Visit  Diagnosis: Unsteadiness on feet (R26.81);Other abnormalities of gait and mobility (R26.89);Muscle weakness (generalized) (M62.81);Difficulty in walking, not elsewhere classified (R26.2)     Time: 8844-8782 PT Time Calculation (min) (ACUTE ONLY): 22 min  Charges:    $Therapeutic Exercise: 8-22 mins PT General Charges $$ ACUTE PT VISIT: 1 Visit                     Lang Ada, PT, DPT [facility] Office: [phone] 2:59 PM, 07/08/2024

## 2024-07-08 NOTE — TOC Progression Note (Signed)
 Transition of Care [facility]) - Progression Note    Patient Details  Name: Chase Malone MRN: [id number] Date of Birth: [birth year]  Transition of Care [facility]) CM/SW Contact  Ronnald MARLA Sil, RN Phone Number: 07/08/2024, 12:06 PM  Clinical Narrative:    Patient discussed during interdisciplinary Progression rounds this morning, discharge plan for patient to transition to [facility] for short term rehab, however facility unable to accept patient until Monday, 1/5 based on the facility's admission criteria r/t patient's Flu A diagnosis, their quaratine & symptom requirement.  CM entered avoidable days from 1/1 - 1/5 based on Dr Avanell documentation of patient's medical readiness to discharge as of yesterday.  CM team will continue to follow along and assist as appropriate.   Expected Discharge Plan: Skilled Nursing Facility Barriers to Discharge: Continued Medical Work up  Expected Discharge Plan and Services In-house Referral: Clinical Social Work Discharge Planning Services: CM Consult Post Acute Care Choice: Skilled Nursing Facility Living arrangements for the past 2 months: Single Family Home   Social Drivers of Health (SDOH) Interventions SDOH Screenings   Food Insecurity: No Food Insecurity (07/02/2024)  Housing: Low Risk (07/02/2024)  Transportation Needs: No Transportation Needs (07/02/2024)  Utilities: Not At Risk (07/02/2024)  Social Connections: Moderately Isolated (07/02/2024)  Tobacco Use: Medium Risk (07/04/2024)    Readmission Risk Interventions    07/03/2024    1:59 PM  Readmission Risk Prevention Plan  Transportation Screening Complete  Home Care Screening Complete  Medication Review (RN CM) Complete

## 2024-07-08 NOTE — Progress Notes (Signed)
 "          PROGRESS NOTE  Chase Malone FMW:[id number] DOB: 1945-11-02 DOA: 07/02/2024 PCP: [facility]  Brief History:  79 year old male with a history of diabetes mellitus type 2, hypertension, hyperlipidemia, CKD stage III, mild cognitive impairment, anxiety/depression presenting with at least 1 week history of generalized weakness, coughing, shortness of breath, chest congestion.  He states that his cough is mostly clear sputum.  Wife is at the bedside to supplement history.  The patient was trying to go to the bathroom to shave.  He required the help of his grandson as the patient was weak and unsteady.  Wife states that he has been having difficulty getting out of bed because of increasing generalized weakness.  Subsequently, the patient made his way back to the bedroom when he fell.  Fortunately, the patient's grandson and wife were near him and were able to ease him onto the floor.  The patient was found on the floor by EMS.  He seemed confused.  Patient was noted to have oxygen saturation in the low 90s.  He was placed on 3 L by EMS. Wife states that the patient has had a functional decline over the past 2 months.  He has had decreased oral intake for the past week.  He had 2 falls this past week.  She states that he has not been placed on any new medications.  There is not been any complaints of chest pain, hemoptysis, nausea, vomiting, diarrhea, abdominal pain, dysuria, hematuria.   In the ED, the patient was febrile up to 100.7 F.  He was hemodynamically stable.  Oxygen saturation was 90% on room air.  He was placed on 2 L with oxygen up to 95%.  WBC 6.7, hemoglobin 1.6, platelets 100,000.  Sodium 135, potassium 4.3, bicarbonate 21, serum creatinine 1.89.  AST 37, ALT 15, alk phosphatase 68, total bilirubin 0.9.  Lactic acid 3.0>> 3.4.  VBG showed 7.4 5/34/56/23.  Chest x-ray showed mild diffuse interstitial opacities.  CT brain was negative for any acute findings.  Influenza A  was positive.  Ammonia 51.  EKG showed sinus rhythm nonspecific ST changes.  The patient was started on oseltamavir, vancomycin , cefepime .  He was given 3 L of LR bolus.    Assessment/Plan:  Sepsis  - Present on admission - Presented with fever and tachycardia - Secondary to influenza - UA negative for pyuria - Follow blood cultures--neg to date - Chest x-ray with bilateral interstitial opacities - sepsis physiology resolved and antibiotic therapy completed.. - Continue to maintain adequate oral hydration and continue supportive care.   Acute HFpEF - Continue daily weight. - 12/29 Echo EF 60-65%, no WMA, , normal RVF, mild MR - continue to obtain ReDS vest reading -d/c pioglitazone -Low-sodium diet, daily weights and adequate hydration has been discussed with patient - Patient's diuretic has been transition to oral route.   Pulmonary infiltrates - secondary to influenza and pulm edema - Check PCT 0.40 - continue empiric ceftriaxone /azithro--finish 5 days on 12/31 -12/29 CT chest--Patchy ground-glass and reticulonodular infiltrates, suspicious for atypical infection; Small bilateral pleural effusions with bibasilar compressive atelectasis. Trace interstitial pulmonary edema   Influenza pneumonitis - Has completed treatment with Tamiflu . - Continue bronchodilators - Continue supportive care, antipyretics and steroids tapering. -Patient is hemodynamically stable and ready for discharge to skilled nursing facility.   CKD stage IIIb - Baseline creatinine 1.6-1.8   Thrombocytopenia - Secondary to infectious process - TSH--2.040 - B12--251 - Folic acid --3.5  Lactic acidosis - initially on IV fluids>>stopped with fluid overload   Generalized weakness - PT evaluation>>SNF - Workup as above -Per TOC report patient most likely amenable for skilled nursing facility discharge based on influenza diagnosis on 07/11/2024   Diabetes mellitus type 2 - Holding metformin - Check  hemoglobin A1c 6.1 - NovoLog  sliding scale -CBGs elevated in the setting of his steroids usage - Continue to follow fluctuation and adjust hypoglycemic regimen as needed -NovoLog  meal coverage has been added.   Mixed hyperlipidemia - Continue statin   Depression/anxiety - Continue fluoxetine  and clonazepam        Family Communication: Wife at bedside.   Consultants:  none   Code Status:  FULL    DVT Prophylaxis:  SCDs     Procedures: As Listed in Progress Note Above   Antibiotics: Ceftriaxone  12/27>>12/31 Azithro 12/27>>12/31    Subjective: Afebrile, no chest pain, no nausea, no vomiting.  Patient remains hemodynamically stable waiting for insurance authorization/nursing on approval for discharge.  Objective: Vitals:   07/08/24 0458 07/08/24 0730 07/08/24 0732 07/08/24 1406  BP: 113/85   97/66  Pulse: 80   79  Resp: 18   18  Temp: (!) 97.4 F (36.3 C)   97.8 F (36.6 C)  TempSrc: Oral   Oral  SpO2: 94% 93% 93% 98%  Weight: 84.4 kg     Height:        Intake/Output Summary (Last 24 hours) at 07/08/2024 1440 Last data filed at 07/08/2024 0932 Gross per 24 hour  Intake 480 ml  Output 300 ml  Net 180 ml   Weight change: -0.7 kg  Exam: General exam: Alert, awake, following commands appropriately and in no acute distress; no overnight events. Respiratory system: Good saturation on room air.  No using accessory muscles. Cardiovascular system: Rate controlled, no rubs, no gallops, no JVD. Gastrointestinal system: Abdomen is nondistended, soft and nontender. No organomegaly or masses felt. Normal bowel sounds heard. Central nervous system: Generally weak.  No focal neurological deficits. Extremities: No cyanosis or clubbing. Skin: No petechiae. Psychiatry: Judgement and insight appear normal. Mood & affect appropriate.   Data Reviewed: I have personally reviewed following labs and imaging studies  Basic Metabolic Panel: Recent Labs  Lab 07/02/24 0513  07/03/24 0437 07/04/24 0501 07/05/24 0955 07/06/24 0509  NA 135 139 139 139 137  K 4.3 3.9 4.1 3.6 3.7  CL 101 100 102 100 100  CO2 21* 22 27 21* 22  GLUCOSE 155* 168* 179* 198* 218*  BUN 13 17 26* 32* 32*  CREATININE 1.89* 1.97* 1.91* 1.69* 1.56*  CALCIUM  8.9 8.8* 8.6* 8.9 8.6*  MG  --  2.0 2.2  --  2.4   Liver Function Tests: Recent Labs  Lab 07/02/24 0513  AST 37  ALT 15  ALKPHOS 68  BILITOT 0.9  PROT 6.1*  ALBUMIN  3.5    Recent Labs  Lab 07/02/24 0534  AMMONIA 51*   Coagulation Profile: Recent Labs  Lab 07/02/24 0513  INR 1.2   CBC: Recent Labs  Lab 07/02/24 0513 07/03/24 0437  WBC 6.7 5.0  NEUTROABS 5.6  --   HGB 11.6* 11.1*  HCT 35.5* 33.6*  MCV 94.4 94.6  PLT 100* 92*   CBG: Recent Labs  Lab 07/07/24 1200 07/07/24 1646 07/07/24 2024 07/08/24 0749 07/08/24 1121  GLUCAP 251* 307* 164* 209* 274*   HbA1C: No results for input(s): HGBA1C in the last 72 hours.  Urine analysis:    Component Value Date/Time  COLORURINE YELLOW 07/02/2024 0541   APPEARANCEUR CLEAR 07/02/2024 0541   APPEARANCEUR Clear 05/09/2021 1023   LABSPEC 1.019 07/02/2024 0541   PHURINE 5.0 07/02/2024 0541   GLUCOSEU NEGATIVE 07/02/2024 0541   HGBUR MODERATE (A) 07/02/2024 0541   BILIRUBINUR NEGATIVE 07/02/2024 0541   BILIRUBINUR Negative 05/09/2021 1023   KETONESUR 5 (A) 07/02/2024 0541   PROTEINUR 30 (A) 07/02/2024 0541   NITRITE NEGATIVE 07/02/2024 0541   LEUKOCYTESUR NEGATIVE 07/02/2024 0541   Sepsis Labs: @LABRCNTIP (procalcitonin:4,lacticidven:4) ) Recent Results (from the past 240 hours)  Blood culture (routine x 2)     Status: None   Collection Time: 07/02/24  5:13 AM   Specimen: BLOOD RIGHT FOREARM  Result Value Ref Range Status   Specimen Description BLOOD RIGHT FOREARM  Final   Special Requests   Final    BOTTLES DRAWN AEROBIC AND ANAEROBIC Blood Culture results may not be optimal due to an inadequate volume of blood received in culture bottles    Culture   Final    NO GROWTH 5 DAYS Performed at [facility], [address], [place], [place] [address]    Report Status 07/07/2024 FINAL  Final  Blood culture (routine x 2)     Status: None   Collection Time: 07/02/24  5:13 AM   Specimen: Left Antecubital; Blood  Result Value Ref Range Status   Specimen Description LEFT ANTECUBITAL  Final   Special Requests   Final    BOTTLES DRAWN AEROBIC AND ANAEROBIC Blood Culture results may not be optimal due to an inadequate volume of blood received in culture bottles   Culture   Final    NO GROWTH 5 DAYS Performed at [facility], [address] [place], [place] [address]    Report Status 07/07/2024 FINAL  Final  Resp panel by RT-PCR (RSV, Flu A&B, Covid) Anterior Nasal Swab     Status: Abnormal   Collection Time: 07/02/24  5:13 AM   Specimen: Anterior Nasal Swab  Result Value Ref Range Status   SARS Coronavirus 2 by RT PCR NEGATIVE NEGATIVE Final    Comment: (NOTE) SARS-CoV-2 target nucleic acids are NOT DETECTED.  The SARS-CoV-2 RNA is generally detectable in upper respiratory specimens during the acute phase of infection. The lowest concentration of SARS-CoV-2 viral copies this assay can detect is 138 copies/mL. A negative result does not preclude SARS-Cov-2 infection and should not be used as the sole basis for treatment or other patient management decisions. A negative result may occur with  improper specimen collection/handling, submission of specimen other than nasopharyngeal swab, presence of viral mutation(s) within the areas targeted by this assay, and inadequate number of viral copies(<138 copies/mL). A negative result must be combined with clinical observations, patient history, and epidemiological information. The expected result is Negative.  Fact Sheet for Patients:  bloggercourse.com  Fact Sheet for Healthcare Providers:  seriousbroker.it  This test is no t yet  approved or cleared by the [place]  FDA and  has been authorized for detection and/or diagnosis of SARS-CoV-2 by FDA under an Emergency Use Authorization (EUA). This EUA will remain  in effect (meaning this test can be used) for the duration of the COVID-19 declaration under Section 564(b)(1) of the Act, 21 U.S.C.section 360bbb-3(b)(1), unless the authorization is terminated  or revoked sooner.       Influenza A by PCR POSITIVE (A) NEGATIVE Final   Influenza B by PCR NEGATIVE NEGATIVE Final    Comment: (NOTE) The Xpert Xpress SARS-CoV-2/FLU/RSV plus assay is intended  as an aid in the diagnosis of influenza from Nasopharyngeal swab specimens and should not be used as a sole basis for treatment. Nasal washings and aspirates are unacceptable for Xpert Xpress SARS-CoV-2/FLU/RSV testing.  Fact Sheet for Patients: bloggercourse.com  Fact Sheet for Healthcare Providers: seriousbroker.it  This test is not yet approved or cleared by the [place]  FDA and has been authorized for detection and/or diagnosis of SARS-CoV-2 by FDA under an Emergency Use Authorization (EUA). This EUA will remain in effect (meaning this test can be used) for the duration of the COVID-19 declaration under Section 564(b)(1) of the Act, 21 U.S.C. section 360bbb-3(b)(1), unless the authorization is terminated or revoked.     Resp Syncytial Virus by PCR NEGATIVE NEGATIVE Final    Comment: (NOTE) Fact Sheet for Patients: bloggercourse.com  Fact Sheet for Healthcare Providers: seriousbroker.it  This test is not yet approved or cleared by the [place]  FDA and has been authorized for detection and/or diagnosis of SARS-CoV-2 by FDA under an Emergency Use Authorization (EUA). This EUA will remain in effect (meaning this test can be used) for the duration of the COVID-19 declaration under Section 564(b)(1)  of the Act, 21 U.S.C. section 360bbb-3(b)(1), unless the authorization is terminated or revoked.  Performed at [facility], [address], [place], [place] [address]   Urine Culture     Status: None   Collection Time: 07/02/24  5:41 AM   Specimen: Urine, Catheterized  Result Value Ref Range Status   Specimen Description   Final    URINE, CATHETERIZED Performed at [facility], [address] [place], [place] [address]    Special Requests   Final    NONE Performed at [facility], [address] [place], [place] [address]    Culture   Final    NO GROWTH Performed at [facility], [address] [place], [place] [address]    Report Status 07/03/2024 FINAL  Final     Scheduled Meds:  amitriptyline   10 mg Oral QHS   arformoterol   15 mcg Nebulization BID   aspirin  EC  81 mg Oral Daily   budesonide  (PULMICORT ) nebulizer solution  0.5 mg Nebulization BID   cholecalciferol   2,000 Units Oral q morning   clonazePAM   1 mg Oral QHS   FLUoxetine   20 mg Oral q morning   folic acid   1 mg Oral Daily   furosemide   40 mg Intravenous Daily   insulin  aspart  0-5 Units Subcutaneous QHS   insulin  aspart  0-9 Units Subcutaneous TID WC   insulin  aspart  3 Units Subcutaneous TID WC   ipratropium-albuterol   3 mL Nebulization BID   loratadine   10 mg Oral Daily   methylPREDNISolone  (SOLU-MEDROL ) injection  40 mg Intravenous Q12H   pantoprazole   80 mg Oral BH-q7a   pregabalin   75 mg Oral Daily   And   pregabalin   150 mg Oral QHS   simvastatin   20 mg Oral QHS   timolol   1 drop Both Eyes q morning   vitamin B-12  500 mcg Oral Daily   Continuous Infusions: None currently  Procedures/Studies: ECHOCARDIOGRAM COMPLETE Result Date: 07/04/2024    ECHOCARDIOGRAM REPORT   Patient Name:   Larnell R Imai Date of Exam: 07/04/2024 Medical Rec #:  [id number]     Height:       66.0 in Accession #:    [id number]    Weight:       181.4 lb Date of Birth:  1945-08-11  BSA:          1.919 m Patient  Age:    78 years      BP:           122/72 mmHg Patient Gender: M             HR:           82 bpm. Exam Location:  [facility] Procedure: 2D Echo, Cardiac Doppler, Color Doppler and Strain Analysis (Both            Spectral and Color Flow Doppler were utilized during procedure). Indications:    CHF-Acute Diastolic I50.31  History:        Patient has prior history of Echocardiogram examinations, most                 recent 06/08/2019. CHF, Signs/Symptoms:Syncope; Risk                 Factors:Former Smoker, Diabetes, Dyslipidemia and Hypertension.  Sonographer:    Aida Pizza RCS Referring Phys: [phone] DAVID TAT  Sonographer Comments: Global longitudinal strain was attempted. IMPRESSIONS  1. Left ventricular ejection fraction, by estimation, is 60 to 65%. The left ventricle has normal function. The left ventricle has no regional wall motion abnormalities. Left ventricular diastolic parameters are indeterminate. The average left ventricular global longitudinal strain is -9.7 %.  2. Right ventricular systolic function is normal. The right ventricular size is normal. Tricuspid regurgitation signal is inadequate for assessing PA pressure.  3. The mitral valve is normal in structure. Mild mitral valve regurgitation. No evidence of mitral stenosis.  4. The aortic valve is normal in structure. Aortic valve regurgitation is not visualized. Aortic valve sclerosis/calcification is present, without any evidence of aortic stenosis. Aortic valve mean gradient measures 6.0 mmHg.  5. The inferior vena cava is dilated in size with >50% respiratory variability, suggesting right atrial pressure of 8 mmHg. Comparison(s): No significant change from prior study. FINDINGS  Left Ventricle: Left ventricular ejection fraction, by estimation, is 60 to 65%. The left ventricle has normal function. The left ventricle has no regional wall motion abnormalities. The average left ventricular global longitudinal strain is -9.7 %. Strain was performed  and the global longitudinal strain is indeterminate. The left ventricular internal cavity size was normal in size. There is no left ventricular hypertrophy. Left ventricular diastolic parameters are indeterminate. Right Ventricle: The right ventricular size is normal. No increase in right ventricular wall thickness. Right ventricular systolic function is normal. Tricuspid regurgitation signal is inadequate for assessing PA pressure. Left Atrium: Left atrial size was normal in size. Right Atrium: Right atrial size was normal in size. Pericardium: There is no evidence of pericardial effusion. Mitral Valve: The mitral valve is normal in structure. Mild mitral valve regurgitation. No evidence of mitral valve stenosis. Tricuspid Valve: The tricuspid valve is normal in structure. Tricuspid valve regurgitation is trivial. No evidence of tricuspid stenosis. Aortic Valve: The aortic valve is normal in structure. Aortic valve regurgitation is not visualized. Aortic valve sclerosis/calcification is present, without any evidence of aortic stenosis. Aortic valve mean gradient measures 6.0 mmHg. Aortic valve peak  gradient measures 11.8 mmHg. Aortic valve area, by VTI measures 1.91 cm. Pulmonic Valve: The pulmonic valve was not well visualized. Pulmonic valve regurgitation is not visualized. No evidence of pulmonic stenosis. Aorta: The aortic root is normal in size and structure. Venous: The inferior vena cava is dilated in size with greater than 50% respiratory variability, suggesting right atrial pressure  of 8 mmHg. IAS/Shunts: No atrial level shunt detected by color flow Doppler. Additional Comments: 3D was performed not requiring image post processing on an independent workstation and was indeterminate.  LEFT VENTRICLE PLAX 2D LVIDd:         5.00 cm      Diastology LVIDs:         3.30 cm      LV e' medial:    9.32 cm/s LV PW:         1.00 cm      LV E/e' medial:  11.7 LV IVS:        1.00 cm      LV e' lateral:   10.30 cm/s  LVOT diam:     1.80 cm      LV E/e' lateral: 10.6 LV SV:         67 LV SV Index:   35           2D Longitudinal Strain LVOT Area:     2.54 cm     2D Strain GLS Avg:     -9.7 %  LV Volumes (MOD) LV vol d, MOD A2C: 80.7 ml LV vol d, MOD A4C: 103.0 ml LV vol s, MOD A2C: 28.1 ml LV vol s, MOD A4C: 37.1 ml LV SV MOD A2C:     52.6 ml LV SV MOD A4C:     103.0 ml LV SV MOD BP:      60.9 ml RIGHT VENTRICLE RV S prime:     12.70 cm/s TAPSE (M-mode): 2.1 cm LEFT ATRIUM             Index        RIGHT ATRIUM           Index LA diam:        4.00 cm 2.08 cm/m   RA Area:     14.10 cm LA Vol (A2C):   56.4 ml 29.39 ml/m  RA Volume:   32.80 ml  17.09 ml/m LA Vol (A4C):   55.3 ml 28.82 ml/m LA Biplane Vol: 58.4 ml 30.44 ml/m  AORTIC VALVE AV Area (Vmax):    1.82 cm AV Area (Vmean):   2.07 cm AV Area (VTI):     1.91 cm AV Vmax:           172.00 cm/s AV Vmean:          105.000 cm/s AV VTI:            0.348 m AV Peak Grad:      11.8 mmHg AV Mean Grad:      6.0 mmHg LVOT Vmax:         123.00 cm/s LVOT Vmean:        85.600 cm/s LVOT VTI:          0.262 m LVOT/AV VTI ratio: 0.75  AORTA Ao Root diam: 3.30 cm MITRAL VALVE MV Area (PHT): 3.91 cm     SHUNTS MV Decel Time: 194 msec     Systemic VTI:  0.26 m MV E velocity: 109.00 cm/s  Systemic Diam: 1.80 cm MV A velocity: 99.00 cm/s MV E/A ratio:  1.10 Vishnu Priya Mallipeddi Electronically signed by Diannah Late Mallipeddi Signature Date/Time: 07/04/2024/3:53:59 PM    Final    CT CHEST WO CONTRAST Result Date: 07/04/2024 EXAM: CT CHEST WITHOUT CONTRAST 07/03/2024 03:00:33 PM TECHNIQUE: CT of the chest was performed without the administration of intravenous contrast. Multiplanar reformatted images are provided for review. Automated  exposure control, iterative reconstruction, and/or weight based adjustment of the mA/kV was utilized to reduce the radiation dose to as low as reasonably achievable. COMPARISON: None available. CLINICAL HISTORY: Respiratory illness, nondiagnostic xray.  FINDINGS: MEDIASTINUM: Heart: Extensive coronary artery calcification. Global cardiac size within normal limits. No pericardial effusion. Vessels: Central pulmonary arteries are of normal caliber. Mild atherosclerotic calcification within the thoracic aorta. No aortic aneurysm. Central airways: The central airways are clear. LYMPH NODES: No mediastinal, hilar or axillary lymphadenopathy. LUNGS AND PLEURA: Small bilateral pleural effusions with associated bibasilar compressive atelectasis. Trace interstitial pulmonary edema. Superimposed patchy ground glass and reticular nodular infiltrates are seen, most prevalent within the left lower lobe, suspicious for atypical infection in the appropriate clinical setting. No central obstructing lesion. No pneumothorax. SOFT TISSUES/BONES: No acute abnormality of the bones or soft tissues. UPPER ABDOMEN: Small hiatal hernia. Cirrhosis. Recanalized umbilical vein in keeping with changes of portal venous hypertension. Cholecystectomy and right nephrectomy have been performed. IMPRESSION: 1. Patchy ground-glass and reticulonodular infiltrates, suspicious for atypical infection. 2. Extensive coronary artery calcification. 3. Small bilateral pleural effusions with bibasilar compressive atelectasis. Trace interstitial pulmonary edema. Together, the findings may reflect changes of mild cardiogenic failure. 4. Cirrhosis with recanalized umbilical vein, consistent with portal hypertension. 5. Small hiatal hernia. Electronically signed by: Dorethia Molt MD 07/04/2024 02:06 AM EST RP Workstation: [id number]   CT HEAD WO CONTRAST Result Date: 07/02/2024 EXAM: CT HEAD WITHOUT CONTRAST 07/02/2024 05:58:10 AM TECHNIQUE: CT of the head was performed without the administration of intravenous contrast. Automated exposure control, iterative reconstruction, and/or weight based adjustment of the mA/kV was utilized to reduce the radiation dose to as low as reasonably achievable. COMPARISON: Head  CT 06/15/2010. CLINICAL HISTORY: 79 year old male. Fall at home. Increased confusion from baseline. FINDINGS: BRAIN AND VENTRICLES: Background brain volume remains normal for age. 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 Mild heterogeneity elsewhere in the bilateral basal ganglia (right caudate series 2 image 35 which also appears likely to be chronic). Gray white differentiation otherwise normal for age. No acute hemorrhage. No evidence of acute infarct. No hydrocephalus. No extra-axial collection. No mass effect or midline shift. Calcified atherosclerosis at the skull base. No suspicious intracranial vascular hyperdensity. ORBITS: Postoperative changes to both globes since 2011. No acute orbit injury identified. SINUSES: Trace low density fluid in the right sphenoid sinus appears inflammatory, while other visible paranasal sinuses, tympanic cavities and mastoids appear clear. SOFT TISSUES AND SKULL: No acute scalp soft tissue injury identified. No skull fracture. IMPRESSION: 1. 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 2. No acute intracranial abnormality.  No acute traumatic injury identified. Electronically signed by: Helayne Hurst MD 07/02/2024 06:04 AM EST RP Workstation: [id number]   DG Chest Port 1 View Result Date: 07/02/2024 EXAM: 1 VIEW(S) XRAY OF THE CHEST 07/02/2024 05:27:54 AM COMPARISON: Portable chest x ray 06/14/2010. CLINICAL HISTORY: 79 year old male with questionable sepsis, evaluate for abnormality. FINDINGS: LUNGS AND PLEURA: Mildly lower lung volumes. Mild diffuse, symmetric increased pulmonary interstitial markings. No consolidation. No convincing confluent lung opacity. No pleural effusion. No pneumothorax. HEART AND MEDIASTINUM: Mediastinal contours remain normal. Negative visible trachea. BONES AND SOFT TISSUES: No acute osseous abnormality. VISIBLE ABDOMEN: Paucity of bowel gas in the visible  abdomen. Cholecystectomy clips suspected. IMPRESSION: 1. Mild diffuse interstitial opacity. Favor vascular congestion / interstitial edema, but viral/atypical respiratory infection could appear similar. Electronically signed by: Helayne Hurst MD 07/02/2024 05:31 AM EST  RP Workstation: [id number]    Eric Nunnery, MD  [facility]  If 7PM-7AM, please contact night-coverage [email or web address] Password [email or web address] 07/08/2024, 2:40 PM   LOS: 6 days   "

## 2024-07-09 DIAGNOSIS — A419 Sepsis, unspecified organism: Secondary | ICD-10-CM | POA: Diagnosis not present

## 2024-07-09 DIAGNOSIS — N1832 Chronic kidney disease, stage 3b: Secondary | ICD-10-CM | POA: Diagnosis not present

## 2024-07-09 DIAGNOSIS — J9601 Acute respiratory failure with hypoxia: Secondary | ICD-10-CM | POA: Diagnosis not present

## 2024-07-09 DIAGNOSIS — J101 Influenza due to other identified influenza virus with other respiratory manifestations: Secondary | ICD-10-CM | POA: Diagnosis not present

## 2024-07-09 LAB — GLUCOSE, CAPILLARY
Glucose-Capillary: 166 mg/dL — ABNORMAL HIGH (ref 70–99)
Glucose-Capillary: 218 mg/dL — ABNORMAL HIGH (ref 70–99)
Glucose-Capillary: 316 mg/dL — ABNORMAL HIGH (ref 70–99)
Glucose-Capillary: 290 mg/dL — ABNORMAL HIGH (ref 70–99)

## 2024-07-09 LAB — BASIC METABOLIC PANEL WITH GFR
Anion gap: 8 (ref 5–15)
BUN: 41 mg/dL — ABNORMAL HIGH (ref 8–23)
CO2: 30 mmol/L (ref 22–32)
Calcium: 8.4 mg/dL — ABNORMAL LOW (ref 8.9–10.3)
Chloride: 100 mmol/L (ref 98–111)
Creatinine, Ser: 1.77 mg/dL — ABNORMAL HIGH (ref 0.61–1.24)
GFR, Estimated: 38 mL/min — ABNORMAL LOW
Glucose, Bld: 164 mg/dL — ABNORMAL HIGH (ref 70–99)
Potassium: 3.6 mmol/L (ref 3.5–5.1)
Sodium: 137 mmol/L (ref 135–145)

## 2024-07-09 NOTE — Plan of Care (Signed)
 Problem: Education: Goal: Knowledge of General Education information will improve Description: Including pain rating scale, medication(s)/side effects and non-pharmacologic comfort measures Outcome: Progressing   Problem: Health Behavior/Discharge Planning: Goal: Ability to manage health-related needs will improve Outcome: Progressing   Problem: Clinical Measurements: Goal: Ability to maintain clinical measurements within normal limits will improve Outcome: Progressing Goal: Will remain free from infection Outcome: Progressing Goal: Diagnostic test results will improve Outcome: Progressing Goal: Respiratory complications will improve Outcome: Progressing Goal: Cardiovascular complication will be avoided Outcome: Progressing   Problem: Activity: Goal: Risk for activity intolerance will decrease Outcome: Progressing   Problem: Nutrition: Goal: Adequate nutrition will be maintained Outcome: Progressing   Problem: Coping: Goal: Level of anxiety will decrease Outcome: Progressing   Problem: Elimination: Goal: Will not experience complications related to bowel motility Outcome: Progressing Goal: Will not experience complications related to urinary retention Outcome: Progressing   Problem: Pain Managment: Goal: General experience of comfort will improve and/or be controlled Outcome: Progressing   Problem: Safety: Goal: Ability to remain free from injury will improve Outcome: Progressing   Problem: Skin Integrity: Goal: Risk for impaired skin integrity will decrease Outcome: Progressing   Problem: Education: Goal: Ability to describe self-care measures that may prevent or decrease complications (Diabetes Survival Skills Education) will improve Outcome: Progressing Goal: Individualized Educational Video(s) Outcome: Progressing   Problem: Coping: Goal: Ability to adjust to condition or change in health will improve Outcome: Progressing   Problem: Fluid  Volume: Goal: Ability to maintain a balanced intake and output will improve Outcome: Progressing   Problem: Health Behavior/Discharge Planning: Goal: Ability to identify and utilize available resources and services will improve Outcome: Progressing Goal: Ability to manage health-related needs will improve Outcome: Progressing   Problem: Metabolic: Goal: Ability to maintain appropriate glucose levels will improve Outcome: Progressing   Problem: Nutritional: Goal: Maintenance of adequate nutrition will improve Outcome: Progressing Goal: Progress toward achieving an optimal weight will improve Outcome: Progressing   Problem: Skin Integrity: Goal: Risk for impaired skin integrity will decrease Outcome: Progressing   Problem: Tissue Perfusion: Goal: Adequacy of tissue perfusion will improve Outcome: Progressing   Problem: Education: Goal: Knowledge of General Education information will improve Description: Including pain rating scale, medication(s)/side effects and non-pharmacologic comfort measures Outcome: Progressing   Problem: Health Behavior/Discharge Planning: Goal: Ability to manage health-related needs will improve Outcome: Progressing   Problem: Clinical Measurements: Goal: Ability to maintain clinical measurements within normal limits will improve Outcome: Progressing Goal: Will remain free from infection Outcome: Progressing Goal: Diagnostic test results will improve Outcome: Progressing Goal: Respiratory complications will improve Outcome: Progressing Goal: Cardiovascular complication will be avoided Outcome: Progressing   Problem: Activity: Goal: Risk for activity intolerance will decrease Outcome: Progressing   Problem: Nutrition: Goal: Adequate nutrition will be maintained Outcome: Progressing   Problem: Coping: Goal: Level of anxiety will decrease Outcome: Progressing   Problem: Elimination: Goal: Will not experience complications related to  bowel motility Outcome: Progressing Goal: Will not experience complications related to urinary retention Outcome: Progressing   Problem: Pain Managment: Goal: General experience of comfort will improve and/or be controlled Outcome: Progressing   Problem: Safety: Goal: Ability to remain free from injury will improve Outcome: Progressing   Problem: Skin Integrity: Goal: Risk for impaired skin integrity will decrease Outcome: Progressing   Problem: Education: Goal: Ability to describe self-care measures that may prevent or decrease complications (Diabetes Survival Skills Education) will improve Outcome: Progressing Goal: Individualized Educational Video(s) Outcome: Progressing   Problem: Coping: Goal: Ability  to adjust to condition or change in health will improve Outcome: Progressing   Problem: Fluid Volume: Goal: Ability to maintain a balanced intake and output will improve Outcome: Progressing   Problem: Health Behavior/Discharge Planning: Goal: Ability to identify and utilize available resources and services will improve Outcome: Progressing Goal: Ability to manage health-related needs will improve Outcome: Progressing   Problem: Metabolic: Goal: Ability to maintain appropriate glucose levels will improve Outcome: Progressing   Problem: Nutritional: Goal: Maintenance of adequate nutrition will improve Outcome: Progressing Goal: Progress toward achieving an optimal weight will improve Outcome: Progressing   Problem: Skin Integrity: Goal: Risk for impaired skin integrity will decrease Outcome: Progressing   Problem: Tissue Perfusion: Goal: Adequacy of tissue perfusion will improve Outcome: Progressing

## 2024-07-09 NOTE — Progress Notes (Signed)
 "          PROGRESS NOTE  [name] FMW:[id number] DOB: 20-Nov-1945 DOA: 07/02/2024 PCP: [facility]  Brief History:  79 year old male with a history of diabetes mellitus type 2, hypertension, hyperlipidemia, CKD stage III, mild cognitive impairment, anxiety/depression presenting with at least 1 week history of generalized weakness, coughing, shortness of breath, chest congestion.  He states that his cough is mostly clear sputum.  Wife is at the bedside to supplement history.  The patient was trying to go to the bathroom to shave.  He required the help of his grandson as the patient was weak and unsteady.  Wife states that he has been having difficulty getting out of bed because of increasing generalized weakness.  Subsequently, the patient made his way back to the bedroom when he fell.  Fortunately, the patient's grandson and wife were near him and were able to ease him onto the floor.  The patient was found on the floor by EMS.  He seemed confused.  Patient was noted to have oxygen saturation in the low 90s.  He was placed on 3 L by EMS. Wife states that the patient has had a functional decline over the past 2 months.  He has had decreased oral intake for the past week.  He had 2 falls this past week.  She states that he has not been placed on any new medications.  There is not been any complaints of chest pain, hemoptysis, nausea, vomiting, diarrhea, abdominal pain, dysuria, hematuria.   In the ED, the patient was febrile up to 100.7 F.  He was hemodynamically stable.  Oxygen saturation was 90% on room air.  He was placed on 2 L with oxygen up to 95%.  WBC 6.7, hemoglobin 1.6, platelets 100,000.  Sodium 135, potassium 4.3, bicarbonate 21, serum creatinine 1.89.  AST 37, ALT 15, alk phosphatase 68, total bilirubin 0.9.  Lactic acid 3.0>> 3.4.  VBG showed 7.4 5/34/56/23.  Chest x-ray showed mild diffuse interstitial opacities.  CT brain was negative for any acute findings.  Influenza A  was positive.  Ammonia 51.  EKG showed sinus rhythm nonspecific ST changes.  The patient was started on oseltamavir, vancomycin , cefepime .  He was given 3 L of LR bolus.    Assessment/Plan:  Sepsis  - Present on admission - Presented with fever and tachycardia - Secondary to influenza - UA negative for pyuria - Follow blood cultures--neg to date - Chest x-ray with bilateral interstitial opacities - sepsis physiology resolved and antibiotic therapy completed.. - Continue to maintain adequate oral hydration and continue supportive care.   Acute HFpEF - Continue daily weight. - 12/29 Echo EF 60-65%, no WMA, , normal RVF, mild MR - continue to obtain ReDS vest reading -d/c pioglitazone -Low-sodium diet, daily weights and adequate hydration has been discussed with patient - Patient's diuretic has been transition to oral route.   Pulmonary infiltrates - secondary to influenza and pulm edema - Check PCT 0.40 - continue empiric ceftriaxone /azithro--finish 5 days on 12/31 -12/29 CT chest--Patchy ground-glass and reticulonodular infiltrates, suspicious for atypical infection; Small bilateral pleural effusions with bibasilar compressive atelectasis. Trace interstitial pulmonary edema   Influenza pneumonitis - Has completed treatment with Tamiflu . - Continue bronchodilators - Continue supportive care, antipyretics and steroids tapering. -Patient is hemodynamically stable and ready for discharge to skilled nursing facility.   CKD stage IIIb - Baseline creatinine 1.6-1.8   Thrombocytopenia - Secondary to infectious process - TSH--2.040 - B12--251 - Folic acid --3.5  Lactic acidosis - initially on IV fluids>>stopped with fluid overload   Generalized weakness - PT evaluation>>SNF - Workup as above -Per TOC report patient most likely amenable for skilled nursing facility discharge based on influenza diagnosis on 07/11/2024   Diabetes mellitus type 2 - Holding metformin  - Check  hemoglobin A1c 6.1 - NovoLog  sliding scale -CBGs elevated in the setting of his steroids usage - Continue to follow fluctuation and adjust hypoglycemic regimen as needed -NovoLog  meal coverage has been added.   Mixed hyperlipidemia - Continue statin   Depression/anxiety - Continue fluoxetine  and clonazepam        Family Communication: Wife at bedside.   Consultants:  none   Code Status:  FULL    DVT Prophylaxis:  SCDs     Procedures: As Listed in Progress Note Above   Antibiotics: Ceftriaxone  12/27>>12/31 Azithro 12/27>>12/31    Subjective: Afebrile, no chest pain, no nausea, no vomiting.  Good saturation on room air.  Hemodynamically stable and waiting insurance authorization/nursing home approval for discharge.  Objective: Vitals:   07/08/24 1948 07/09/24 0550 07/09/24 0932 07/09/24 1406  BP: 117/66 113/64  102/62  Pulse: 75 74  73  Resp: 18 17  15   Temp: 98.4 F (36.9 C) 98 F (36.7 C)  98.4 F (36.9 C)  TempSrc:  Oral  Oral  SpO2: 94% 95% 93% 95%  Weight:  77.3 kg    Height:        Intake/Output Summary (Last 24 hours) at 07/09/2024 1521 Last data filed at 07/09/2024 1239 Gross per 24 hour  Intake 480 ml  Output 1200 ml  Net -720 ml   Weight change: -7.1 kg  Exam: General exam: Alert, awake, following commands appropriately.  No overnight events. Respiratory system: Good saturation on room air; No using accessory muscles. Cardiovascular system: Rate controlled, no rubs, no gallops, no JVD. Gastrointestinal system: Abdomen is nondistended, soft and nontender. No organomegaly or masses felt. Normal bowel sounds heard. Central nervous system: Moving 4 limbs spontaneously.  Generally weak.  No focal neurological deficits. Extremities: No cyanosis or clubbing. Skin: No petechiae. Psychiatry: Judgement and insight appear normal. Mood & affect appropriate.   Data Reviewed: I have personally reviewed following labs and imaging studies  Basic Metabolic  Panel: Recent Labs  Lab 07/03/24 0437 07/04/24 0501 07/05/24 0955 07/06/24 0509 07/09/24 0536  NA 139 139 139 137 137  K 3.9 4.1 3.6 3.7 3.6  CL 100 102 100 100 100  CO2 22 27 21* 22 30  GLUCOSE 168* 179* 198* 218* 164*  BUN 17 26* 32* 32* 41*  CREATININE 1.97* 1.91* 1.69* 1.56* 1.77*  CALCIUM  8.8* 8.6* 8.9 8.6* 8.4*  MG 2.0 2.2  --  2.4  --    Coagulation Profile: No results for input(s): INR, PROTIME in the last 168 hours.  CBC: Recent Labs  Lab 07/03/24 0437  WBC 5.0  HGB 11.1*  HCT 33.6*  MCV 94.6  PLT 92*   CBG: Recent Labs  Lab 07/08/24 1121 07/08/24 1625 07/08/24 1949 07/09/24 0740 07/09/24 1140  GLUCAP 274* 211* 243* 166* 218*   Urine analysis:    Component Value Date/Time   COLORURINE YELLOW 07/02/2024 0541   APPEARANCEUR CLEAR 07/02/2024 0541   APPEARANCEUR Clear 05/09/2021 1023   LABSPEC 1.019 07/02/2024 0541   PHURINE 5.0 07/02/2024 0541   GLUCOSEU NEGATIVE 07/02/2024 0541   HGBUR MODERATE (A) 07/02/2024 0541   BILIRUBINUR NEGATIVE 07/02/2024 0541   BILIRUBINUR Negative 05/09/2021 1023   KETONESUR 5 (A)  07/02/2024 0541   PROTEINUR 30 (A) 07/02/2024 0541   NITRITE NEGATIVE 07/02/2024 0541   LEUKOCYTESUR NEGATIVE 07/02/2024 0541   Sepsis Labs: @LABRCNTIP (procalcitonin:4,lacticidven:4) ) Recent Results (from the past 240 hours)  Blood culture (routine x 2)     Status: None   Collection Time: 07/02/24  5:13 AM   Specimen: BLOOD RIGHT FOREARM  Result Value Ref Range Status   Specimen Description BLOOD RIGHT FOREARM  Final   Special Requests   Final    BOTTLES DRAWN AEROBIC AND ANAEROBIC Blood Culture results may not be optimal due to an inadequate volume of blood received in culture bottles   Culture   Final    NO GROWTH 5 DAYS Performed at [facility], [address] [place], [place] [address]    Report Status 07/07/2024 FINAL  Final  Blood culture (routine x 2)     Status: None   Collection Time: 07/02/24  5:13 AM   Specimen:  Left Antecubital; Blood  Result Value Ref Range Status   Specimen Description LEFT ANTECUBITAL  Final   Special Requests   Final    BOTTLES DRAWN AEROBIC AND ANAEROBIC Blood Culture results may not be optimal due to an inadequate volume of blood received in culture bottles   Culture   Final    NO GROWTH 5 DAYS Performed at [facility], [address] [place], [place] [address]    Report Status 07/07/2024 FINAL  Final  Resp panel by RT-PCR (RSV, Flu A&B, Covid) Anterior Nasal Swab     Status: Abnormal   Collection Time: 07/02/24  5:13 AM   Specimen: Anterior Nasal Swab  Result Value Ref Range Status   SARS Coronavirus 2 by RT PCR NEGATIVE NEGATIVE Final    Comment: (NOTE) SARS-CoV-2 target nucleic acids are NOT DETECTED.  The SARS-CoV-2 RNA is generally detectable in upper respiratory specimens during the acute phase of infection. The lowest concentration of SARS-CoV-2 viral copies this assay can detect is 138 copies/mL. A negative result does not preclude SARS-Cov-2 infection and should not be used as the sole basis for treatment or other patient management decisions. A negative result may occur with  improper specimen collection/handling, submission of specimen other than nasopharyngeal swab, presence of viral mutation(s) within the areas targeted by this assay, and inadequate number of viral copies(<138 copies/mL). A negative result must be combined with clinical observations, patient history, and epidemiological information. The expected result is Negative.  Fact Sheet for Patients:  bloggercourse.com  Fact Sheet for Healthcare Providers:  seriousbroker.it  This test is no t yet approved or cleared by the [place]  FDA and  has been authorized for detection and/or diagnosis of SARS-CoV-2 by FDA under an Emergency Use Authorization (EUA). This EUA will remain  in effect (meaning this test can be used) for the duration of  the COVID-19 declaration under Section 564(b)(1) of the Act, 21 U.S.C.section 360bbb-3(b)(1), unless the authorization is terminated  or revoked sooner.       Influenza A by PCR POSITIVE (A) NEGATIVE Final   Influenza B by PCR NEGATIVE NEGATIVE Final    Comment: (NOTE) The Xpert Xpress SARS-CoV-2/FLU/RSV plus assay is intended as an aid in the diagnosis of influenza from Nasopharyngeal swab specimens and should not be used as a sole basis for treatment. Nasal washings and aspirates are unacceptable for Xpert Xpress SARS-CoV-2/FLU/RSV testing.  Fact Sheet for Patients: bloggercourse.com  Fact Sheet for Healthcare Providers: seriousbroker.it  This test is not yet approved or cleared by the  [place]  FDA and has been authorized for detection and/or diagnosis of SARS-CoV-2 by FDA under an Emergency Use Authorization (EUA). This EUA will remain in effect (meaning this test can be used) for the duration of the COVID-19 declaration under Section 564(b)(1) of the Act, 21 U.S.C. section 360bbb-3(b)(1), unless the authorization is terminated or revoked.     Resp Syncytial Virus by PCR NEGATIVE NEGATIVE Final    Comment: (NOTE) Fact Sheet for Patients: bloggercourse.com  Fact Sheet for Healthcare Providers: seriousbroker.it  This test is not yet approved or cleared by the [place]  FDA and has been authorized for detection and/or diagnosis of SARS-CoV-2 by FDA under an Emergency Use Authorization (EUA). This EUA will remain in effect (meaning this test can be used) for the duration of the COVID-19 declaration under Section 564(b)(1) of the Act, 21 U.S.C. section 360bbb-3(b)(1), unless the authorization is terminated or revoked.  Performed at [facility], [address] [place], [place] [address]   Urine Culture     Status: None   Collection Time: 07/02/24  5:41 AM    Specimen: Urine, Catheterized  Result Value Ref Range Status   Specimen Description   Final    URINE, CATHETERIZED Performed at [facility], [address], [place], [place] [address]    Special Requests   Final    NONE Performed at [facility], [address], [place], [place] [address]    Culture   Final    NO GROWTH Performed at [facility], [address] [place], [place] [address]    Report Status 07/03/2024 FINAL  Final     Scheduled Meds:  amitriptyline   10 mg Oral QHS   arformoterol   15 mcg Nebulization BID   aspirin  EC  81 mg Oral Daily   budesonide  (PULMICORT ) nebulizer solution  0.5 mg Nebulization BID   cholecalciferol   2,000 Units Oral q morning   clonazePAM   1 mg Oral QHS   FLUoxetine   20 mg Oral q morning   folic acid   1 mg Oral Daily   furosemide   40 mg Oral BID   insulin  aspart  0-5 Units Subcutaneous QHS   insulin  aspart  0-9 Units Subcutaneous TID WC   insulin  aspart  3 Units Subcutaneous TID WC   ipratropium-albuterol   3 mL Nebulization BID   loratadine   10 mg Oral Daily   pantoprazole   80 mg Oral BH-q7a   predniSONE   60 mg Oral Q breakfast   pregabalin   75 mg Oral Daily   And   pregabalin   150 mg Oral QHS   simvastatin   20 mg Oral QHS   timolol   1 drop Both Eyes q morning   vitamin B-12  500 mcg Oral Daily   Continuous Infusions: None currently  Procedures/Studies: ECHOCARDIOGRAM COMPLETE Result Date: 07/04/2024    ECHOCARDIOGRAM REPORT   Patient Name:   Grady R Ha Date of Exam: 07/04/2024 Medical Rec #:  [id number]     Height:       66.0 in Accession #:    [id number]    Weight:       181.4 lb Date of Birth:  [birth year]     BSA:          1.919 m Patient Age:    78 years      BP:           122/72 mmHg Patient Gender: M             HR:  82 bpm. Exam Location:  [facility] Procedure: 2D Echo, Cardiac Doppler, Color Doppler and Strain Analysis (Both            Spectral and Color Flow Doppler were utilized during procedure).  Indications:    CHF-Acute Diastolic I50.31  History:        Patient has prior history of Echocardiogram examinations, most                 recent 06/08/2019. CHF, Signs/Symptoms:Syncope; Risk                 Factors:Former Smoker, Diabetes, Dyslipidemia and Hypertension.  Sonographer:    Aida Pizza RCS Referring Phys: [id number] DAVID TAT  Sonographer Comments: Global longitudinal strain was attempted. IMPRESSIONS  1. Left ventricular ejection fraction, by estimation, is 60 to 65%. The left ventricle has normal function. The left ventricle has no regional wall motion abnormalities. Left ventricular diastolic parameters are indeterminate. The average left ventricular global longitudinal strain is -9.7 %.  2. Right ventricular systolic function is normal. The right ventricular size is normal. Tricuspid regurgitation signal is inadequate for assessing PA pressure.  3. The mitral valve is normal in structure. Mild mitral valve regurgitation. No evidence of mitral stenosis.  4. The aortic valve is normal in structure. Aortic valve regurgitation is not visualized. Aortic valve sclerosis/calcification is present, without any evidence of aortic stenosis. Aortic valve mean gradient measures 6.0 mmHg.  5. The inferior vena cava is dilated in size with >50% respiratory variability, suggesting right atrial pressure of 8 mmHg. Comparison(s): No significant change from prior study. FINDINGS  Left Ventricle: Left ventricular ejection fraction, by estimation, is 60 to 65%. The left ventricle has normal function. The left ventricle has no regional wall motion abnormalities. The average left ventricular global longitudinal strain is -9.7 %. Strain was performed and the global longitudinal strain is indeterminate. The left ventricular internal cavity size was normal in size. There is no left ventricular hypertrophy. Left ventricular diastolic parameters are indeterminate. Right Ventricle: The right ventricular size is normal. No increase  in right ventricular wall thickness. Right ventricular systolic function is normal. Tricuspid regurgitation signal is inadequate for assessing PA pressure. Left Atrium: Left atrial size was normal in size. Right Atrium: Right atrial size was normal in size. Pericardium: There is no evidence of pericardial effusion. Mitral Valve: The mitral valve is normal in structure. Mild mitral valve regurgitation. No evidence of mitral valve stenosis. Tricuspid Valve: The tricuspid valve is normal in structure. Tricuspid valve regurgitation is trivial. No evidence of tricuspid stenosis. Aortic Valve: The aortic valve is normal in structure. Aortic valve regurgitation is not visualized. Aortic valve sclerosis/calcification is present, without any evidence of aortic stenosis. Aortic valve mean gradient measures 6.0 mmHg. Aortic valve peak  gradient measures 11.8 mmHg. Aortic valve area, by VTI measures 1.91 cm. Pulmonic Valve: The pulmonic valve was not well visualized. Pulmonic valve regurgitation is not visualized. No evidence of pulmonic stenosis. Aorta: The aortic root is normal in size and structure. Venous: The inferior vena cava is dilated in size with greater than 50% respiratory variability, suggesting right atrial pressure of 8 mmHg. IAS/Shunts: No atrial level shunt detected by color flow Doppler. Additional Comments: 3D was performed not requiring image post processing on an independent workstation and was indeterminate.  LEFT VENTRICLE PLAX 2D LVIDd:         5.00 cm      Diastology LVIDs:         3.30 cm  LV e' medial:    9.32 cm/s LV PW:         1.00 cm      LV E/e' medial:  11.7 LV IVS:        1.00 cm      LV e' lateral:   10.30 cm/s LVOT diam:     1.80 cm      LV E/e' lateral: 10.6 LV SV:         67 LV SV Index:   35           2D Longitudinal Strain LVOT Area:     2.54 cm     2D Strain GLS Avg:     -9.7 %  LV Volumes (MOD) LV vol d, MOD A2C: 80.7 ml LV vol d, MOD A4C: 103.0 ml LV vol s, MOD A2C: 28.1 ml LV  vol s, MOD A4C: 37.1 ml LV SV MOD A2C:     52.6 ml LV SV MOD A4C:     103.0 ml LV SV MOD BP:      60.9 ml RIGHT VENTRICLE RV S prime:     12.70 cm/s TAPSE (M-mode): 2.1 cm LEFT ATRIUM             Index        RIGHT ATRIUM           Index LA diam:        4.00 cm 2.08 cm/m   RA Area:     14.10 cm LA Vol (A2C):   56.4 ml 29.39 ml/m  RA Volume:   32.80 ml  17.09 ml/m LA Vol (A4C):   55.3 ml 28.82 ml/m LA Biplane Vol: 58.4 ml 30.44 ml/m  AORTIC VALVE AV Area (Vmax):    1.82 cm AV Area (Vmean):   2.07 cm AV Area (VTI):     1.91 cm AV Vmax:           172.00 cm/s AV Vmean:          105.000 cm/s AV VTI:            0.348 m AV Peak Grad:      11.8 mmHg AV Mean Grad:      6.0 mmHg LVOT Vmax:         123.00 cm/s LVOT Vmean:        85.600 cm/s LVOT VTI:          0.262 m LVOT/AV VTI ratio: 0.75  AORTA Ao Root diam: 3.30 cm MITRAL VALVE MV Area (PHT): 3.91 cm     SHUNTS MV Decel Time: 194 msec     Systemic VTI:  0.26 m MV E velocity: 109.00 cm/s  Systemic Diam: 1.80 cm MV A velocity: 99.00 cm/s MV E/A ratio:  1.10 Vishnu Priya Mallipeddi Electronically signed by Diannah Late Mallipeddi Signature Date/Time: 07/04/2024/3:53:59 PM    Final    CT CHEST WO CONTRAST Result Date: 07/04/2024 EXAM: CT CHEST WITHOUT CONTRAST 07/03/2024 03:00:33 PM TECHNIQUE: CT of the chest was performed without the administration of intravenous contrast. Multiplanar reformatted images are provided for review. Automated exposure control, iterative reconstruction, and/or weight based adjustment of the mA/kV was utilized to reduce the radiation dose to as low as reasonably achievable. COMPARISON: None available. CLINICAL HISTORY: Respiratory illness, nondiagnostic xray. FINDINGS: MEDIASTINUM: Heart: Extensive coronary artery calcification. Global cardiac size within normal limits. No pericardial effusion. Vessels: Central pulmonary arteries are of normal caliber. Mild atherosclerotic calcification within the thoracic aorta. No aortic aneurysm.  Central airways: The central airways are clear. LYMPH NODES: No mediastinal, hilar or axillary lymphadenopathy. LUNGS AND PLEURA: Small bilateral pleural effusions with associated bibasilar compressive atelectasis. Trace interstitial pulmonary edema. Superimposed patchy ground glass and reticular nodular infiltrates are seen, most prevalent within the left lower lobe, suspicious for atypical infection in the appropriate clinical setting. No central obstructing lesion. No pneumothorax. SOFT TISSUES/BONES: No acute abnormality of the bones or soft tissues. UPPER ABDOMEN: Small hiatal hernia. Cirrhosis. Recanalized umbilical vein in keeping with changes of portal venous hypertension. Cholecystectomy and right nephrectomy have been performed. IMPRESSION: 1. Patchy ground-glass and reticulonodular infiltrates, suspicious for atypical infection. 2. Extensive coronary artery calcification. 3. Small bilateral pleural effusions with bibasilar compressive atelectasis. Trace interstitial pulmonary edema. Together, the findings may reflect changes of mild cardiogenic failure. 4. Cirrhosis with recanalized umbilical vein, consistent with portal hypertension. 5. Small hiatal hernia. Electronically signed by: Dorethia Molt MD 07/04/2024 02:06 AM EST RP Workstation: [id number]   CT HEAD WO CONTRAST Result Date: 07/02/2024 EXAM: CT HEAD WITHOUT CONTRAST 07/02/2024 05:58:10 AM TECHNIQUE: CT of the head was performed without the administration of intravenous contrast. Automated exposure control, iterative reconstruction, and/or weight based adjustment of the mA/kV was utilized to reduce the radiation dose to as low as reasonably achievable. COMPARISON: Head CT 06/15/2010. CLINICAL HISTORY: 79 year old male. Fall at home. Increased confusion from baseline. FINDINGS: BRAIN AND VENTRICLES: Background brain volume remains normal for age. 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  Mild heterogeneity elsewhere in the bilateral basal ganglia (right caudate series 2 image 35 which also appears likely to be chronic). Gray white differentiation otherwise normal for age. No acute hemorrhage. No evidence of acute infarct. No hydrocephalus. No extra-axial collection. No mass effect or midline shift. Calcified atherosclerosis at the skull base. No suspicious intracranial vascular hyperdensity. ORBITS: Postoperative changes to both globes since 2011. No acute orbit injury identified. SINUSES: Trace low density fluid in the right sphenoid sinus appears inflammatory, while other visible paranasal sinuses, tympanic cavities and mastoids appear clear. SOFT TISSUES AND SKULL: No acute scalp soft tissue injury identified. No skull fracture. IMPRESSION: 1. 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 2. No acute intracranial abnormality.  No acute traumatic injury identified. Electronically signed by: Helayne Hurst MD 07/02/2024 06:04 AM EST RP Workstation: [id number]   DG Chest Port 1 View Result Date: 07/02/2024 EXAM: 1 VIEW(S) XRAY OF THE CHEST 07/02/2024 05:27:54 AM COMPARISON: Portable chest x ray 06/14/2010. CLINICAL HISTORY: 79 year old male with questionable sepsis, evaluate for abnormality. FINDINGS: LUNGS AND PLEURA: Mildly lower lung volumes. Mild diffuse, symmetric increased pulmonary interstitial markings. No consolidation. No convincing confluent lung opacity. No pleural effusion. No pneumothorax. HEART AND MEDIASTINUM: Mediastinal contours remain normal. Negative visible trachea. BONES AND SOFT TISSUES: No acute osseous abnormality. VISIBLE ABDOMEN: Paucity of bowel gas in the visible abdomen. Cholecystectomy clips suspected. IMPRESSION: 1. Mild diffuse interstitial opacity. Favor vascular congestion / interstitial edema, but viral/atypical respiratory infection could appear similar. Electronically signed by: Helayne Hurst MD 07/02/2024 05:31 AM EST RP  Workstation: [id number]    Eric Nunnery, MD  [facility]  If 7PM-7AM, please contact night-coverage [email or web address] Password [email or web address] 07/09/2024, 3:21 PM   LOS: 7 days   "

## 2024-07-10 DIAGNOSIS — A419 Sepsis, unspecified organism: Secondary | ICD-10-CM | POA: Diagnosis not present

## 2024-07-10 DIAGNOSIS — J101 Influenza due to other identified influenza virus with other respiratory manifestations: Secondary | ICD-10-CM | POA: Diagnosis not present

## 2024-07-10 DIAGNOSIS — N1832 Chronic kidney disease, stage 3b: Secondary | ICD-10-CM | POA: Diagnosis not present

## 2024-07-10 DIAGNOSIS — J9601 Acute respiratory failure with hypoxia: Secondary | ICD-10-CM | POA: Diagnosis not present

## 2024-07-10 LAB — GLUCOSE, CAPILLARY
Glucose-Capillary: 169 mg/dL — ABNORMAL HIGH (ref 70–99)
Glucose-Capillary: 179 mg/dL — ABNORMAL HIGH (ref 70–99)
Glucose-Capillary: 279 mg/dL — ABNORMAL HIGH (ref 70–99)
Glucose-Capillary: 315 mg/dL — ABNORMAL HIGH (ref 70–99)

## 2024-07-10 MED ORDER — PREDNISONE 20 MG PO TABS
40.0000 mg | ORAL_TABLET | Freq: Every day | ORAL | Status: DC
  Administered 2024-07-11: 40 mg via ORAL
  Filled 2024-07-10: qty 2

## 2024-07-10 NOTE — Progress Notes (Signed)
 "          PROGRESS NOTE  AQIL GOETTING FMW:[id number] DOB: [birth year] DOA: 07/02/2024 PCP: [facility]  Brief History:  79 year old male with a history of diabetes mellitus type 2, hypertension, hyperlipidemia, CKD stage III, mild cognitive impairment, anxiety/depression presenting with at least 1 week history of generalized weakness, coughing, shortness of breath, chest congestion.  He states that his cough is mostly clear sputum.  Wife is at the bedside to supplement history.  The patient was trying to go to the bathroom to shave.  He required the help of his grandson as the patient was weak and unsteady.  Wife states that he has been having difficulty getting out of bed because of increasing generalized weakness.  Subsequently, the patient made his way back to the bedroom when he fell.  Fortunately, the patient's grandson and wife were near him and were able to ease him onto the floor.  The patient was found on the floor by EMS.  He seemed confused.  Patient was noted to have oxygen saturation in the low 90s.  He was placed on 3 L by EMS. Wife states that the patient has had a functional decline over the past 2 months.  He has had decreased oral intake for the past week.  He had 2 falls this past week.  She states that he has not been placed on any new medications.  There is not been any complaints of chest pain, hemoptysis, nausea, vomiting, diarrhea, abdominal pain, dysuria, hematuria.   In the ED, the patient was febrile up to 100.7 F.  He was hemodynamically stable.  Oxygen saturation was 90% on room air.  He was placed on 2 L with oxygen up to 95%.  WBC 6.7, hemoglobin 1.6, platelets 100,000.  Sodium 135, potassium 4.3, bicarbonate 21, serum creatinine 1.89.  AST 37, ALT 15, alk phosphatase 68, total bilirubin 0.9.  Lactic acid 3.0>> 3.4.  VBG showed 7.4 5/34/56/23.  Chest x-ray showed mild diffuse interstitial opacities.  CT brain was negative for any acute findings.  Influenza A  was positive.  Ammonia 51.  EKG showed sinus rhythm nonspecific ST changes.  The patient was started on oseltamavir, vancomycin , cefepime .  He was given 3 L of LR bolus.    Assessment/Plan:  Sepsis  - Present on admission - Presented with fever and tachycardia - Secondary to influenza - UA negative for pyuria - Follow blood cultures--neg to date - Chest x-ray with bilateral interstitial opacities - sepsis physiology resolved and antibiotic therapy completed.. - Continue to maintain adequate oral hydration and continue supportive care.   Acute HFpEF - Continue daily weight. - 12/29 Echo EF 60-65%, no WMA, , normal RVF, mild MR - continue to obtain ReDS vest reading -d/c pioglitazone -Low-sodium diet, daily weights and adequate hydration has been discussed with patient - Patient's diuretic has been transition to oral route.   Pulmonary infiltrates - secondary to influenza and pulm edema - Check PCT 0.40 - continue empiric ceftriaxone /azithro--finish 5 days on 12/31 -12/29 CT chest--Patchy ground-glass and reticulonodular infiltrates, suspicious for atypical infection; Small bilateral pleural effusions with bibasilar compressive atelectasis. Trace interstitial pulmonary edema   Influenza pneumonitis - Has completed treatment with Tamiflu . - Continue bronchodilators - Continue supportive care, antipyretics and steroids tapering. -Patient is hemodynamically stable and ready for discharge to skilled nursing facility.   CKD stage IIIb - Baseline creatinine 1.6-1.8   Thrombocytopenia - Secondary to infectious process - TSH--2.040 - B12--251 - Folic acid --3.5  Lactic acidosis - initially on IV fluids>>stopped with fluid overload   Generalized weakness - PT evaluation>>SNF - Workup as above -Per TOC report patient most likely amenable for skilled nursing facility discharge based on influenza diagnosis on 07/11/2024   Diabetes mellitus type 2 - Holding metformin  - Check  hemoglobin A1c 6.1 - NovoLog  sliding scale -CBGs elevated in the setting of his steroids usage - Continue to follow fluctuation and adjust hypoglycemic regimen as needed -NovoLog  meal coverage has been added.   Mixed hyperlipidemia - Continue statin   Depression/anxiety - Continue fluoxetine  and clonazepam        Family Communication: Wife at bedside.   Consultants:  none   Code Status:  FULL    DVT Prophylaxis:  SCDs     Procedures: As Listed in Progress Note Above   Antibiotics: Ceftriaxone  12/27>>12/31 Azithro 12/27>>12/31    Subjective: No fever, no chest pain, no nausea, no vomiting.  Tolerating medications orally and in no acute distress.  No overnight events reported.  Patient remained generally weak and deconditioned.  He is hemodynamically stable and just waiting insurance authorization and skilled nursing facility clearance to to pre-CERT rehabilitation placement.  Objective: Vitals:   07/09/24 1406 07/09/24 2100 07/10/24 0554 07/10/24 1013  BP: 102/62 111/64 113/66   Pulse: 73 72 63   Resp: 15 20 19    Temp: 98.4 F (36.9 C) 98.1 F (36.7 C) 98.4 F (36.9 C)   TempSrc: Oral Oral    SpO2: 95% 99% 95% (!) 63%  Weight:   83.1 kg   Height:        Intake/Output Summary (Last 24 hours) at 07/10/2024 1353 Last data filed at 07/10/2024 1316 Gross per 24 hour  Intake 920 ml  Output 2000 ml  Net -1080 ml   Weight change: 5.8 kg  Exam: General exam: Alert, awake, following commands appropriately and demonstrating good saturation on room air.  Patient was sitting up on bedside chair having lunch. Respiratory system: No using accessory muscles; no wheezing or crackles appreciated on exam. Cardiovascular system: Rate controlled, no rubs, no gallops, no JVD. Gastrointestinal system: Abdomen is nondistended, soft and nontender. No organomegaly or masses felt. Normal bowel sounds heard. Central nervous system: Generally weak.  No focal neurological  deficits. Extremities: No cyanosis or clubbing. Skin: No rashes, lesions or ulcers Psychiatry: Judgement and insight appear normal. Mood & affect appropriate.   Data Reviewed: I have personally reviewed following labs and imaging studies  Basic Metabolic Panel: Recent Labs  Lab 07/04/24 0501 07/05/24 0955 07/06/24 0509 07/09/24 0536  NA 139 139 137 137  K 4.1 3.6 3.7 3.6  CL 102 100 100 100  CO2 27 21* 22 30  GLUCOSE 179* 198* 218* 164*  BUN 26* 32* 32* 41*  CREATININE 1.91* 1.69* 1.56* 1.77*  CALCIUM  8.6* 8.9 8.6* 8.4*  MG 2.2  --  2.4  --    Coagulation Profile: No results for input(s): INR, PROTIME in the last 168 hours.  CBC: No results for input(s): WBC, NEUTROABS, HGB, HCT, MCV, PLT in the last 168 hours.  CBG: Recent Labs  Lab 07/09/24 1140 07/09/24 1635 07/09/24 2048 07/10/24 0713 07/10/24 1143  GLUCAP 218* 316* 290* 169* 179*   Urine analysis:    Component Value Date/Time   COLORURINE YELLOW 07/02/2024 0541   APPEARANCEUR CLEAR 07/02/2024 0541   APPEARANCEUR Clear 05/09/2021 1023   LABSPEC 1.019 07/02/2024 0541   PHURINE 5.0 07/02/2024 0541   GLUCOSEU NEGATIVE 07/02/2024 0541   HGBUR  MODERATE (A) 07/02/2024 0541   BILIRUBINUR NEGATIVE 07/02/2024 0541   BILIRUBINUR Negative 05/09/2021 1023   KETONESUR 5 (A) 07/02/2024 0541   PROTEINUR 30 (A) 07/02/2024 0541   NITRITE NEGATIVE 07/02/2024 0541   LEUKOCYTESUR NEGATIVE 07/02/2024 0541   Sepsis Labs: @LABRCNTIP (procalcitonin:4,lacticidven:4) ) Recent Results (from the past 240 hours)  Blood culture (routine x 2)     Status: None   Collection Time: 07/02/24  5:13 AM   Specimen: BLOOD RIGHT FOREARM  Result Value Ref Range Status   Specimen Description BLOOD RIGHT FOREARM  Final   Special Requests   Final    BOTTLES DRAWN AEROBIC AND ANAEROBIC Blood Culture results may not be optimal due to an inadequate volume of blood received in culture bottles   Culture   Final    NO GROWTH 5  DAYS Performed at [facility], [address] [place], [place] [address]    Report Status 07/07/2024 FINAL  Final  Blood culture (routine x 2)     Status: None   Collection Time: 07/02/24  5:13 AM   Specimen: Left Antecubital; Blood  Result Value Ref Range Status   Specimen Description LEFT ANTECUBITAL  Final   Special Requests   Final    BOTTLES DRAWN AEROBIC AND ANAEROBIC Blood Culture results may not be optimal due to an inadequate volume of blood received in culture bottles   Culture   Final    NO GROWTH 5 DAYS Performed at [facility], [address], [place], [place] [address]    Report Status 07/07/2024 FINAL  Final  Resp panel by RT-PCR (RSV, Flu A&B, Covid) Anterior Nasal Swab     Status: Abnormal   Collection Time: 07/02/24  5:13 AM   Specimen: Anterior Nasal Swab  Result Value Ref Range Status   SARS Coronavirus 2 by RT PCR NEGATIVE NEGATIVE Final    Comment: (NOTE) SARS-CoV-2 target nucleic acids are NOT DETECTED.  The SARS-CoV-2 RNA is generally detectable in upper respiratory specimens during the acute phase of infection. The lowest concentration of SARS-CoV-2 viral copies this assay can detect is 138 copies/mL. A negative result does not preclude SARS-Cov-2 infection and should not be used as the sole basis for treatment or other patient management decisions. A negative result may occur with  improper specimen collection/handling, submission of specimen other than nasopharyngeal swab, presence of viral mutation(s) within the areas targeted by this assay, and inadequate number of viral copies(<138 copies/mL). A negative result must be combined with clinical observations, patient history, and epidemiological information. The expected result is Negative.  Fact Sheet for Patients:  bloggercourse.com  Fact Sheet for Healthcare Providers:  seriousbroker.it  This test is no t yet approved or cleared by the [place]  [place] FDA and  has been authorized for detection and/or diagnosis of SARS-CoV-2 by FDA under an Emergency Use Authorization (EUA). This EUA will remain  in effect (meaning this test can be used) for the duration of the COVID-19 declaration under Section 564(b)(1) of the Act, 21 U.S.C.section 360bbb-3(b)(1), unless the authorization is terminated  or revoked sooner.       Influenza A by PCR POSITIVE (A) NEGATIVE Final   Influenza B by PCR NEGATIVE NEGATIVE Final    Comment: (NOTE) The Xpert Xpress SARS-CoV-2/FLU/RSV plus assay is intended as an aid in the diagnosis of influenza from Nasopharyngeal swab specimens and should not be used as a sole basis for treatment. Nasal washings and aspirates are unacceptable for Xpert Xpress SARS-CoV-2/FLU/RSV testing.  Fact Sheet  for Patients: bloggercourse.com  Fact Sheet for Healthcare Providers: seriousbroker.it  This test is not yet approved or cleared by the [place]  FDA and has been authorized for detection and/or diagnosis of SARS-CoV-2 by FDA under an Emergency Use Authorization (EUA). This EUA will remain in effect (meaning this test can be used) for the duration of the COVID-19 declaration under Section 564(b)(1) of the Act, 21 U.S.C. section 360bbb-3(b)(1), unless the authorization is terminated or revoked.     Resp Syncytial Virus by PCR NEGATIVE NEGATIVE Final    Comment: (NOTE) Fact Sheet for Patients: bloggercourse.com  Fact Sheet for Healthcare Providers: seriousbroker.it  This test is not yet approved or cleared by the [place]  FDA and has been authorized for detection and/or diagnosis of SARS-CoV-2 by FDA under an Emergency Use Authorization (EUA). This EUA will remain in effect (meaning this test can be used) for the duration of the COVID-19 declaration under Section 564(b)(1) of the Act, 21 U.S.C. section  360bbb-3(b)(1), unless the authorization is terminated or revoked.  Performed at [facility], [address] [place], [place] [address]   Urine Culture     Status: None   Collection Time: 07/02/24  5:41 AM   Specimen: Urine, Catheterized  Result Value Ref Range Status   Specimen Description   Final    URINE, CATHETERIZED Performed at [facility], [address], [place], [place] [address]    Special Requests   Final    NONE Performed at [facility], [address], [place], [place] [address]    Culture   Final    NO GROWTH Performed at [facility], [address] [place], [place] [address]    Report Status 07/03/2024 FINAL  Final     Scheduled Meds:  amitriptyline   10 mg Oral QHS   arformoterol   15 mcg Nebulization BID   aspirin  EC  81 mg Oral Daily   budesonide  (PULMICORT ) nebulizer solution  0.5 mg Nebulization BID   cholecalciferol   2,000 Units Oral q morning   clonazePAM   1 mg Oral QHS   FLUoxetine   20 mg Oral q morning   folic acid   1 mg Oral Daily   furosemide   40 mg Oral BID   insulin  aspart  0-5 Units Subcutaneous QHS   insulin  aspart  0-9 Units Subcutaneous TID WC   insulin  aspart  3 Units Subcutaneous TID WC   ipratropium-albuterol   3 mL Nebulization BID   loratadine   10 mg Oral Daily   pantoprazole   80 mg Oral BH-q7a   predniSONE   60 mg Oral Q breakfast   pregabalin   75 mg Oral Daily   And   pregabalin   150 mg Oral QHS   simvastatin   20 mg Oral QHS   timolol   1 drop Both Eyes q morning   vitamin B-12  500 mcg Oral Daily   Continuous Infusions: None currently  Procedures/Studies: ECHOCARDIOGRAM COMPLETE Result Date: 07/04/2024    ECHOCARDIOGRAM REPORT   Patient Name:   Heron R Lute Date of Exam: 07/04/2024 Medical Rec #:  [id number]     Height:       66.0 in Accession #:    [id number]    Weight:       181.4 lb Date of Birth:  10-31-1945     BSA:          1.919 m Patient Age:    78 years      BP:  122/72 mmHg Patient Gender: M              HR:           82 bpm. Exam Location:  [facility] Procedure: 2D Echo, Cardiac Doppler, Color Doppler and Strain Analysis (Both            Spectral and Color Flow Doppler were utilized during procedure). Indications:    CHF-Acute Diastolic I50.31  History:        Patient has prior history of Echocardiogram examinations, most                 recent 06/08/2019. CHF, Signs/Symptoms:Syncope; Risk                 Factors:Former Smoker, Diabetes, Dyslipidemia and Hypertension.  Sonographer:    Aida Pizza RCS Referring Phys: [id number] DAVID TAT  Sonographer Comments: Global longitudinal strain was attempted. IMPRESSIONS  1. Left ventricular ejection fraction, by estimation, is 60 to 65%. The left ventricle has normal function. The left ventricle has no regional wall motion abnormalities. Left ventricular diastolic parameters are indeterminate. The average left ventricular global longitudinal strain is -9.7 %.  2. Right ventricular systolic function is normal. The right ventricular size is normal. Tricuspid regurgitation signal is inadequate for assessing PA pressure.  3. The mitral valve is normal in structure. Mild mitral valve regurgitation. No evidence of mitral stenosis.  4. The aortic valve is normal in structure. Aortic valve regurgitation is not visualized. Aortic valve sclerosis/calcification is present, without any evidence of aortic stenosis. Aortic valve mean gradient measures 6.0 mmHg.  5. The inferior vena cava is dilated in size with >50% respiratory variability, suggesting right atrial pressure of 8 mmHg. Comparison(s): No significant change from prior study. FINDINGS  Left Ventricle: Left ventricular ejection fraction, by estimation, is 60 to 65%. The left ventricle has normal function. The left ventricle has no regional wall motion abnormalities. The average left ventricular global longitudinal strain is -9.7 %. Strain was performed and the global longitudinal strain is indeterminate. The left ventricular  internal cavity size was normal in size. There is no left ventricular hypertrophy. Left ventricular diastolic parameters are indeterminate. Right Ventricle: The right ventricular size is normal. No increase in right ventricular wall thickness. Right ventricular systolic function is normal. Tricuspid regurgitation signal is inadequate for assessing PA pressure. Left Atrium: Left atrial size was normal in size. Right Atrium: Right atrial size was normal in size. Pericardium: There is no evidence of pericardial effusion. Mitral Valve: The mitral valve is normal in structure. Mild mitral valve regurgitation. No evidence of mitral valve stenosis. Tricuspid Valve: The tricuspid valve is normal in structure. Tricuspid valve regurgitation is trivial. No evidence of tricuspid stenosis. Aortic Valve: The aortic valve is normal in structure. Aortic valve regurgitation is not visualized. Aortic valve sclerosis/calcification is present, without any evidence of aortic stenosis. Aortic valve mean gradient measures 6.0 mmHg. Aortic valve peak  gradient measures 11.8 mmHg. Aortic valve area, by VTI measures 1.91 cm. Pulmonic Valve: The pulmonic valve was not well visualized. Pulmonic valve regurgitation is not visualized. No evidence of pulmonic stenosis. Aorta: The aortic root is normal in size and structure. Venous: The inferior vena cava is dilated in size with greater than 50% respiratory variability, suggesting right atrial pressure of 8 mmHg. IAS/Shunts: No atrial level shunt detected by color flow Doppler. Additional Comments: 3D was performed not requiring image post processing on an independent workstation and was indeterminate.  LEFT VENTRICLE PLAX 2D  LVIDd:         5.00 cm      Diastology LVIDs:         3.30 cm      LV e' medial:    9.32 cm/s LV PW:         1.00 cm      LV E/e' medial:  11.7 LV IVS:        1.00 cm      LV e' lateral:   10.30 cm/s LVOT diam:     1.80 cm      LV E/e' lateral: 10.6 LV SV:         67 LV SV  Index:   35           2D Longitudinal Strain LVOT Area:     2.54 cm     2D Strain GLS Avg:     -9.7 %  LV Volumes (MOD) LV vol d, MOD A2C: 80.7 ml LV vol d, MOD A4C: 103.0 ml LV vol s, MOD A2C: 28.1 ml LV vol s, MOD A4C: 37.1 ml LV SV MOD A2C:     52.6 ml LV SV MOD A4C:     103.0 ml LV SV MOD BP:      60.9 ml RIGHT VENTRICLE RV S prime:     12.70 cm/s TAPSE (M-mode): 2.1 cm LEFT ATRIUM             Index        RIGHT ATRIUM           Index LA diam:        4.00 cm 2.08 cm/m   RA Area:     14.10 cm LA Vol (A2C):   56.4 ml 29.39 ml/m  RA Volume:   32.80 ml  17.09 ml/m LA Vol (A4C):   55.3 ml 28.82 ml/m LA Biplane Vol: 58.4 ml 30.44 ml/m  AORTIC VALVE AV Area (Vmax):    1.82 cm AV Area (Vmean):   2.07 cm AV Area (VTI):     1.91 cm AV Vmax:           172.00 cm/s AV Vmean:          105.000 cm/s AV VTI:            0.348 m AV Peak Grad:      11.8 mmHg AV Mean Grad:      6.0 mmHg LVOT Vmax:         123.00 cm/s LVOT Vmean:        85.600 cm/s LVOT VTI:          0.262 m LVOT/AV VTI ratio: 0.75  AORTA Ao Root diam: 3.30 cm MITRAL VALVE MV Area (PHT): 3.91 cm     SHUNTS MV Decel Time: 194 msec     Systemic VTI:  0.26 m MV E velocity: 109.00 cm/s  Systemic Diam: 1.80 cm MV A velocity: 99.00 cm/s MV E/A ratio:  1.10 Vishnu Priya Mallipeddi Electronically signed by Diannah Late Mallipeddi Signature Date/Time: 07/04/2024/3:53:59 PM    Final    CT CHEST WO CONTRAST Result Date: 07/04/2024 EXAM: CT CHEST WITHOUT CONTRAST 07/03/2024 03:00:33 PM TECHNIQUE: CT of the chest was performed without the administration of intravenous contrast. Multiplanar reformatted images are provided for review. Automated exposure control, iterative reconstruction, and/or weight based adjustment of the mA/kV was utilized to reduce the radiation dose to as low as reasonably achievable. COMPARISON: None available. CLINICAL HISTORY: Respiratory illness, nondiagnostic xray. FINDINGS: MEDIASTINUM:  Heart: Extensive coronary artery calcification.  Global cardiac size within normal limits. No pericardial effusion. Vessels: Central pulmonary arteries are of normal caliber. Mild atherosclerotic calcification within the thoracic aorta. No aortic aneurysm. Central airways: The central airways are clear. LYMPH NODES: No mediastinal, hilar or axillary lymphadenopathy. LUNGS AND PLEURA: Small bilateral pleural effusions with associated bibasilar compressive atelectasis. Trace interstitial pulmonary edema. Superimposed patchy ground glass and reticular nodular infiltrates are seen, most prevalent within the left lower lobe, suspicious for atypical infection in the appropriate clinical setting. No central obstructing lesion. No pneumothorax. SOFT TISSUES/BONES: No acute abnormality of the bones or soft tissues. UPPER ABDOMEN: Small hiatal hernia. Cirrhosis. Recanalized umbilical vein in keeping with changes of portal venous hypertension. Cholecystectomy and right nephrectomy have been performed. IMPRESSION: 1. Patchy ground-glass and reticulonodular infiltrates, suspicious for atypical infection. 2. Extensive coronary artery calcification. 3. Small bilateral pleural effusions with bibasilar compressive atelectasis. Trace interstitial pulmonary edema. Together, the findings may reflect changes of mild cardiogenic failure. 4. Cirrhosis with recanalized umbilical vein, consistent with portal hypertension. 5. Small hiatal hernia. Electronically signed by: Dorethia Molt MD 07/04/2024 02:06 AM EST RP Workstation: [id number]   CT HEAD WO CONTRAST Result Date: 07/02/2024 EXAM: CT HEAD WITHOUT CONTRAST 07/02/2024 05:58:10 AM TECHNIQUE: CT of the head was performed without the administration of intravenous contrast. Automated exposure control, iterative reconstruction, and/or weight based adjustment of the mA/kV was utilized to reduce the radiation dose to as low as reasonably achievable. COMPARISON: Head CT 06/15/2010. CLINICAL HISTORY: 79 year old male. Fall at home.  Increased confusion from baseline. FINDINGS: BRAIN AND VENTRICLES: Background brain volume remains normal for age. 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 Mild heterogeneity elsewhere in the bilateral basal ganglia (right caudate series 2 image 35 which also appears likely to be chronic). Gray white differentiation otherwise normal for age. No acute hemorrhage. No evidence of acute infarct. No hydrocephalus. No extra-axial collection. No mass effect or midline shift. Calcified atherosclerosis at the skull base. No suspicious intracranial vascular hyperdensity. ORBITS: Postoperative changes to both globes since 2011. No acute orbit injury identified. SINUSES: Trace low density fluid in the right sphenoid sinus appears inflammatory, while other visible paranasal sinuses, tympanic cavities and mastoids appear clear. SOFT TISSUES AND SKULL: No acute scalp soft tissue injury identified. No skull fracture. IMPRESSION: 1. 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 2. No acute intracranial abnormality.  No acute traumatic injury identified. Electronically signed by: Helayne Hurst MD 07/02/2024 06:04 AM EST RP Workstation: [id number]   DG Chest Port 1 View Result Date: 07/02/2024 EXAM: 1 VIEW(S) XRAY OF THE CHEST 07/02/2024 05:27:54 AM COMPARISON: Portable chest x ray 06/14/2010. CLINICAL HISTORY: 79 year old male with questionable sepsis, evaluate for abnormality. FINDINGS: LUNGS AND PLEURA: Mildly lower lung volumes. Mild diffuse, symmetric increased pulmonary interstitial markings. No consolidation. No convincing confluent lung opacity. No pleural effusion. No pneumothorax. HEART AND MEDIASTINUM: Mediastinal contours remain normal. Negative visible trachea. BONES AND SOFT TISSUES: No acute osseous abnormality. VISIBLE ABDOMEN: Paucity of bowel gas in the visible abdomen. Cholecystectomy clips suspected. IMPRESSION: 1. Mild  diffuse interstitial opacity. Favor vascular congestion / interstitial edema, but viral/atypical respiratory infection could appear similar. Electronically signed by: Helayne Hurst MD 07/02/2024 05:31 AM EST RP Workstation: [id number]    Eric Nunnery, MD  [facility]  If 7PM-7AM, please contact night-coverage [email or web address] Password [email or web address] 07/10/2024, 1:53 PM   LOS: 8 days   "

## 2024-07-10 NOTE — Plan of Care (Signed)
 " Problem: Education: Goal: Knowledge of General Education information will improve Description: Including pain rating scale, medication(s)/side effects and non-pharmacologic comfort measures Outcome: Progressing   Problem: Health Behavior/Discharge Planning: Goal: Ability to manage health-related needs will improve Outcome: Progressing   Problem: Clinical Measurements: Goal: Ability to maintain clinical measurements within normal limits will improve Outcome: Progressing Goal: Will remain free from infection Outcome: Progressing Goal: Diagnostic test results will improve Outcome: Progressing Goal: Respiratory complications will improve Outcome: Progressing Goal: Cardiovascular complication will be avoided Outcome: Progressing   Problem: Activity: Goal: Risk for activity intolerance will decrease Outcome: Progressing   Problem: Nutrition: Goal: Adequate nutrition will be maintained Outcome: Progressing   Problem: Coping: Goal: Level of anxiety will decrease Outcome: Progressing   Problem: Elimination: Goal: Will not experience complications related to bowel motility Outcome: Progressing Goal: Will not experience complications related to urinary retention Outcome: Progressing   Problem: Pain Managment: Goal: General experience of comfort will improve and/or be controlled Outcome: Progressing   Problem: Safety: Goal: Ability to remain free from injury will improve Outcome: Progressing   Problem: Skin Integrity: Goal: Risk for impaired skin integrity will decrease Outcome: Progressing   Problem: Education: Goal: Ability to describe self-care measures that may prevent or decrease complications (Diabetes Survival Skills Education) will improve Outcome: Progressing Goal: Individualized Educational Video(s) Outcome: Progressing   Problem: Coping: Goal: Ability to adjust to condition or change in health will improve Outcome: Progressing   Problem: Fluid  Volume: Goal: Ability to maintain a balanced intake and output will improve Outcome: Progressing   Problem: Health Behavior/Discharge Planning: Goal: Ability to identify and utilize available resources and services will improve Outcome: Progressing Goal: Ability to manage health-related needs will improve Outcome: Progressing   Problem: Metabolic: Goal: Ability to maintain appropriate glucose levels will improve Outcome: Progressing   Problem: Nutritional: Goal: Maintenance of adequate nutrition will improve Outcome: Progressing Goal: Progress toward achieving an optimal weight will improve Outcome: Progressing   Problem: Skin Integrity: Goal: Risk for impaired skin integrity will decrease Outcome: Progressing   Problem: Tissue Perfusion: Goal: Adequacy of tissue perfusion will improve Outcome: Progressing   Problem: Education: Goal: Knowledge of General Education information will improve Description: Including pain rating scale, medication(s)/side effects and non-pharmacologic comfort measures Outcome: Progressing   Problem: Health Behavior/Discharge Planning: Goal: Ability to manage health-related needs will improve Outcome: Progressing   Problem: Activity: Goal: Risk for activity intolerance will decrease Outcome: Progressing   Problem: Nutrition: Goal: Adequate nutrition will be maintained Outcome: Progressing   Problem: Coping: Goal: Level of anxiety will decrease Outcome: Progressing   Problem: Elimination: Goal: Will not experience complications related to bowel motility Outcome: Progressing Goal: Will not experience complications related to urinary retention Outcome: Progressing   Problem: Pain Managment: Goal: General experience of comfort will improve and/or be controlled Outcome: Progressing   Problem: Safety: Goal: Ability to remain free from injury will improve Outcome: Progressing   Problem: Skin Integrity: Goal: Risk for impaired skin  integrity will decrease Outcome: Progressing   Problem: Education: Goal: Ability to describe self-care measures that may prevent or decrease complications (Diabetes Survival Skills Education) will improve Outcome: Progressing Goal: Individualized Educational Video(s) Outcome: Progressing   Problem: Coping: Goal: Ability to adjust to condition or change in health will improve Outcome: Progressing   Problem: Fluid Volume: Goal: Ability to maintain a balanced intake and output will improve Outcome: Progressing   Problem: Health Behavior/Discharge Planning: Goal: Ability to identify and utilize available resources and services will improve  Outcome: Progressing Goal: Ability to manage health-related needs will improve Outcome: Progressing   Problem: Metabolic: Goal: Ability to maintain appropriate glucose levels will improve Outcome: Progressing   Problem: Nutritional: Goal: Maintenance of adequate nutrition will improve Outcome: Progressing Goal: Progress toward achieving an optimal weight will improve Outcome: Progressing   Problem: Skin Integrity: Goal: Risk for impaired skin integrity will decrease Outcome: Progressing   Problem: Tissue Perfusion: Goal: Adequacy of tissue perfusion will improve Outcome: Progressing   "

## 2024-07-10 NOTE — Plan of Care (Signed)

## 2024-07-11 DIAGNOSIS — E1140 Type 2 diabetes mellitus with diabetic neuropathy, unspecified: Secondary | ICD-10-CM

## 2024-07-11 LAB — GLUCOSE, CAPILLARY
Glucose-Capillary: 165 mg/dL — ABNORMAL HIGH (ref 70–99)
Glucose-Capillary: 179 mg/dL — ABNORMAL HIGH (ref 70–99)

## 2024-07-11 MED ORDER — FOLIC ACID 1 MG PO TABS: 1.0000 mg | ORAL_TABLET | Freq: Every day | ORAL | Status: AC

## 2024-07-11 MED ORDER — FUROSEMIDE 40 MG PO TABS: 40.0000 mg | ORAL_TABLET | Freq: Two times a day (BID) | ORAL | Status: AC

## 2024-07-11 MED ORDER — CYANOCOBALAMIN 500 MCG PO TABS: 500.0000 ug | ORAL_TABLET | Freq: Every day | ORAL | Status: AC

## 2024-07-11 MED ORDER — METFORMIN HCL ER 750 MG PO TB24: 750.0000 mg | ORAL_TABLET | ORAL | Status: AC

## 2024-07-11 MED ORDER — PREDNISONE 20 MG PO TABS: ORAL_TABLET | ORAL | Status: AC

## 2024-07-11 MED ORDER — CLONAZEPAM 1 MG PO TABS: 1.0000 mg | ORAL_TABLET | Freq: Every day | ORAL | 0 refills | Status: AC

## 2024-07-11 MED ORDER — BUDESONIDE-FORMOTEROL FUMARATE 160-4.5 MCG/ACT IN AERO: 2.0000 | INHALATION_SPRAY | Freq: Two times a day (BID) | RESPIRATORY_TRACT | Status: AC

## 2024-07-11 NOTE — Discharge Summary (Signed)
 " Physician Discharge Summary   Patient: Chase Malone MRN: [id number] DOB: May 27, 1946  Admit date:     07/02/2024  Discharge date: 07/11/2024  Discharge Physician: Eric Nunnery   PCP: [facility]   Recommendations at discharge:  Repeat basic metabolic panel to follow electrolytes and renal function Continue to follow CBG fluctuation and adjust hypoglycemic regimen as needed. Repeat chest x-ray 8 in 6-8 weeks to assure resolution of infiltrates.   Discharge Diagnoses: Principal Problem:   Sepsis due to undetermined organism [facility]) Active Problems:   Acute respiratory failure with hypoxia (HCC)   Chronic kidney disease, stage 3b (HCC)   Lactic acidosis   Thrombocytopenia   Influenza A   Acute diastolic CHF (congestive heart failure) (HCC)   Brief History:  79 year old male with a history of diabetes mellitus type 2, hypertension, hyperlipidemia, CKD stage III, mild cognitive impairment, anxiety/depression presenting with at least 1 week history of generalized weakness, coughing, shortness of breath, chest congestion.  He states that his cough is mostly clear sputum.  Wife is at the bedside to supplement history.  The patient was trying to go to the bathroom to shave.  He required the help of his grandson as the patient was weak and unsteady.  Wife states that he has been having difficulty getting out of bed because of increasing generalized weakness.  Subsequently, the patient made his way back to the bedroom when he fell.  Fortunately, the patient's grandson and wife were near him and were able to ease him onto the floor.  The patient was found on the floor by EMS.  He seemed confused.  Patient was noted to have oxygen saturation in the low 90s.  He was placed on 3 L by EMS. Wife states that the patient has had a functional decline over the past 2 months.  He has had decreased oral intake for the past week.  He had 2 falls this past week.  She states that he has not been  placed on any new medications.  There is not been any complaints of chest pain, hemoptysis, nausea, vomiting, diarrhea, abdominal pain, dysuria, hematuria.   In the ED, the patient was febrile up to 100.7 F.  He was hemodynamically stable.  Oxygen saturation was 90% on room air.  He was placed on 2 L with oxygen up to 95%.  WBC 6.7, hemoglobin 1.6, platelets 100,000.  Sodium 135, potassium 4.3, bicarbonate 21, serum creatinine 1.89.  AST 37, ALT 15, alk phosphatase 68, total bilirubin 0.9.  Lactic acid 3.0>> 3.4.  VBG showed 7.4 5/34/56/23.  Chest x-ray showed mild diffuse interstitial opacities.  CT brain was negative for any acute findings.  Influenza A was positive.  Ammonia 51.  EKG showed sinus rhythm nonspecific ST changes.  The patient was started on oseltamavir, vancomycin , cefepime .  He was given 3 L of LR bolus.    Assessment and Plan: Sepsis  - Present on admission - Presented with fever and tachycardia - Secondary to influenza - UA negative for pyuria - Follow blood cultures--neg to date - Chest x-ray with bilateral interstitial opacities - sepsis physiology resolved and antibiotic therapy completed.. - Continue to maintain adequate oral hydration and continue supportive care.   Acute HFpEF - Continue daily weight. - 12/29 Echo EF 60-65%, no WMA, , normal RVF, mild MR - continue to obtain ReDS vest reading -d/c pioglitazone -Low-sodium diet, daily weights and adequate hydration has been discussed with patient - Patient's diuretic has been transition to  oral route.   Pulmonary infiltrates - secondary to influenza and pulm edema - Check PCT 0.40 - continue empiric ceftriaxone /azithro--finish 5 days on 12/31 -12/29 CT chest--Patchy ground-glass and reticulonodular infiltrates, suspicious for atypical infection; Small bilateral pleural effusions with bibasilar compressive atelectasis. Trace interstitial pulmonary edema   Influenza pneumonitis - Has completed treatment with  Tamiflu . - Continue bronchodilators - Continue supportive care, antipyretics and complete steroids tapering. -Patient is hemodynamically stable and ready for discharge to skilled nursing facility.   CKD stage IIIb - Baseline creatinine 1.6-1.8 -Stable and well-controlled - Maintain adequate hydration and minimize nephrotoxic agents.   Thrombocytopenia - Secondary to infectious process - TSH--2.040 - B12--251 - Folic acid --3.5 -No overt bleeding appreciated - Continue to follow platelet count trend.   Lactic acidosis - Present at time of admission; resolved after receiving fluid resuscitation. - Maintain adequate hydration.   Generalized weakness - PT evaluation>>SNF - Workup as above - Patient has remained hemodynamically stable and ready for discharge.   Diabetes mellitus type 2 - Checked hemoglobin A1c 6.1 - Sliding scale insulin  has been used throughout hospitalization - At discharge resumption adjusted dose of oral hypoglycemic agents has been started - Given acute heart failure Actos has been discontinued.   Mixed hyperlipidemia - Continue statin   Depression/anxiety - Continue fluoxetine  and as needed clonazepam  -Stable mood appreciated on exam.  No suicidal ideation or hallucination.     Consultants: None Procedures performed: See below for x-ray reports. Disposition: Skilled nursing facility Diet recommendation: Heart healthy/modified carbohydrate diet.  DISCHARGE MEDICATION: Allergies as of 07/11/2024   No Known Allergies      Medication List     STOP taking these medications    pioglitazone 15 MG tablet Commonly known as: ACTOS       TAKE these medications    acetaminophen  500 MG tablet Commonly known as: TYLENOL  Take 1,000 mg by mouth every 6 (six) hours as needed for moderate pain.   amitriptyline  10 MG tablet Commonly known as: ELAVIL  Take 10 mg by mouth at bedtime.   aspirin  EC 81 MG tablet Take 81 mg by mouth at bedtime.    budesonide -formoterol  160-4.5 MCG/ACT inhaler Commonly known as: Symbicort  Inhale 2 puffs into the lungs 2 (two) times daily.   calcium  carbonate 500 MG chewable tablet Commonly known as: TUMS - dosed in mg elemental calcium  Chew 1 tablet by mouth daily as needed for indigestion or heartburn.   cetirizine 10 MG tablet Commonly known as: ZYRTEC Take 10 mg by mouth daily.   clonazePAM  1 MG tablet Commonly known as: KLONOPIN  Take 1 tablet (1 mg total) by mouth at bedtime.   cyanocobalamin  500 MCG tablet Commonly known as: VITAMIN B12 Take 1 tablet (500 mcg total) by mouth daily. Start taking on: July 12, 2024   Fish Oil 1000 MG Caps Take 1,000 mg by mouth every morning.   FLUoxetine  20 MG capsule Commonly known as: PROZAC  Take 20 mg by mouth every morning.   folic acid  1 MG tablet Commonly known as: FOLVITE  Take 1 tablet (1 mg total) by mouth daily. Start taking on: July 12, 2024   furosemide  40 MG tablet Commonly known as: LASIX  Take 1 tablet (40 mg total) by mouth 2 (two) times daily.   latanoprost 0.005 % ophthalmic solution Commonly known as: XALATAN SMARTSIG:In Eye(s)   metFORMIN  750 MG 24 hr tablet Commonly known as: GLUCOPHAGE -XR Take 1 tablet (750 mg total) by mouth every morning. What changed:  medication strength how much to  take   pantoprazole  40 MG tablet Commonly known as: PROTONIX  Take 80 mg by mouth every morning.   predniSONE  20 MG tablet Commonly known as: DELTASONE  Take 3 tablets by mouth daily x 1 day; then 2 tablets by mouth daily x 2 days; then 1 tablet by mouth daily x 3 days; then half tablet by mouth daily x 3 days and stop prednisone . Start taking on: July 12, 2024   pregabalin  75 MG capsule Commonly known as: LYRICA  Take 75-150 mg by mouth See admin instructions. 75 mg in the morning and 150 mg in the evening   simvastatin  20 MG tablet Commonly known as: ZOCOR  Take 20 mg by mouth at bedtime.   SUMAtriptan 100 MG  tablet Commonly known as: IMITREX Take 100 mg by mouth daily as needed for migraine. May repeat in 2 hours if headache persists or recurs.   timolol  0.5 % ophthalmic solution Commonly known as: TIMOPTIC  Place 1 drop into both eyes every morning.   Vitamin D3 50 MCG (2000 UT) Tabs Take 2,000 Units by mouth every morning.        Contact information for follow-up providers     [facility]. Schedule an appointment as soon as possible for a visit in 2 week(s).   Why: After discharge from a skilled nursing facility. Contact information: [address] [place] [address] [phone]              Contact information for after-discharge care     Destination     [facility] .   Service: Skilled Nursing Contact information: [address] [facility] [address] [place]  [address] [phone]                    Discharge Exam: Filed Weights   07/09/24 0550 07/10/24 0554 07/11/24 0500  Weight: 77.3 kg 83.1 kg 81.4 kg    General exam: Alert, awake, following commands appropriately.  No overnight events. Respiratory system: Good saturation on room air; No using accessory muscles. Cardiovascular system: Rate controlled, no rubs, no gallops, no JVD. Gastrointestinal system: Abdomen is nondistended, soft and nontender. No organomegaly or masses felt. Normal bowel sounds heard. Central nervous system: Moving 4 limbs spontaneously.  Generally weak.  No focal neurological deficits. Extremities: No cyanosis or clubbing. Skin: No petechiae. Psychiatry: Judgement and insight appear normal. Mood & affect appropriate.   Condition at discharge: Stable and improved.  The results of significant diagnostics from this hospitalization (including imaging, microbiology, ancillary and laboratory) are listed below for reference.   Imaging Studies: ECHOCARDIOGRAM COMPLETE Result Date: 07/04/2024    ECHOCARDIOGRAM REPORT   Patient Name:   Ladd R Disbrow Date of  Exam: 07/04/2024 Medical Rec #:  [id number]     Height:       66.0 in Accession #:    [id number]    Weight:       181.4 lb Date of Birth:  1945/09/16     BSA:          1.919 m Patient Age:    78 years      BP:           122/72 mmHg Patient Gender: M             HR:           82 bpm. Exam Location:  [facility] Procedure: 2D Echo, Cardiac Doppler, Color Doppler and Strain Analysis (Both  Spectral and Color Flow Doppler were utilized during procedure). Indications:    CHF-Acute Diastolic I50.31  History:        Patient has prior history of Echocardiogram examinations, most                 recent 06/08/2019. CHF, Signs/Symptoms:Syncope; Risk                 Factors:Former Smoker, Diabetes, Dyslipidemia and Hypertension.  Sonographer:    Aida Pizza RCS Referring Phys: [phone] DAVID TAT  Sonographer Comments: Global longitudinal strain was attempted. IMPRESSIONS  1. Left ventricular ejection fraction, by estimation, is 60 to 65%. The left ventricle has normal function. The left ventricle has no regional wall motion abnormalities. Left ventricular diastolic parameters are indeterminate. The average left ventricular global longitudinal strain is -9.7 %.  2. Right ventricular systolic function is normal. The right ventricular size is normal. Tricuspid regurgitation signal is inadequate for assessing PA pressure.  3. The mitral valve is normal in structure. Mild mitral valve regurgitation. No evidence of mitral stenosis.  4. The aortic valve is normal in structure. Aortic valve regurgitation is not visualized. Aortic valve sclerosis/calcification is present, without any evidence of aortic stenosis. Aortic valve mean gradient measures 6.0 mmHg.  5. The inferior vena cava is dilated in size with >50% respiratory variability, suggesting right atrial pressure of 8 mmHg. Comparison(s): No significant change from prior study. FINDINGS  Left Ventricle: Left ventricular ejection fraction, by estimation, is 60 to 65%. The left  ventricle has normal function. The left ventricle has no regional wall motion abnormalities. The average left ventricular global longitudinal strain is -9.7 %. Strain was performed and the global longitudinal strain is indeterminate. The left ventricular internal cavity size was normal in size. There is no left ventricular hypertrophy. Left ventricular diastolic parameters are indeterminate. Right Ventricle: The right ventricular size is normal. No increase in right ventricular wall thickness. Right ventricular systolic function is normal. Tricuspid regurgitation signal is inadequate for assessing PA pressure. Left Atrium: Left atrial size was normal in size. Right Atrium: Right atrial size was normal in size. Pericardium: There is no evidence of pericardial effusion. Mitral Valve: The mitral valve is normal in structure. Mild mitral valve regurgitation. No evidence of mitral valve stenosis. Tricuspid Valve: The tricuspid valve is normal in structure. Tricuspid valve regurgitation is trivial. No evidence of tricuspid stenosis. Aortic Valve: The aortic valve is normal in structure. Aortic valve regurgitation is not visualized. Aortic valve sclerosis/calcification is present, without any evidence of aortic stenosis. Aortic valve mean gradient measures 6.0 mmHg. Aortic valve peak  gradient measures 11.8 mmHg. Aortic valve area, by VTI measures 1.91 cm. Pulmonic Valve: The pulmonic valve was not well visualized. Pulmonic valve regurgitation is not visualized. No evidence of pulmonic stenosis. Aorta: The aortic root is normal in size and structure. Venous: The inferior vena cava is dilated in size with greater than 50% respiratory variability, suggesting right atrial pressure of 8 mmHg. IAS/Shunts: No atrial level shunt detected by color flow Doppler. Additional Comments: 3D was performed not requiring image post processing on an independent workstation and was indeterminate.  LEFT VENTRICLE PLAX 2D LVIDd:         5.00  cm      Diastology LVIDs:         3.30 cm      LV e' medial:    9.32 cm/s LV PW:         1.00 cm  LV E/e' medial:  11.7 LV IVS:        1.00 cm      LV e' lateral:   10.30 cm/s LVOT diam:     1.80 cm      LV E/e' lateral: 10.6 LV SV:         67 LV SV Index:   35           2D Longitudinal Strain LVOT Area:     2.54 cm     2D Strain GLS Avg:     -9.7 %  LV Volumes (MOD) LV vol d, MOD A2C: 80.7 ml LV vol d, MOD A4C: 103.0 ml LV vol s, MOD A2C: 28.1 ml LV vol s, MOD A4C: 37.1 ml LV SV MOD A2C:     52.6 ml LV SV MOD A4C:     103.0 ml LV SV MOD BP:      60.9 ml RIGHT VENTRICLE RV S prime:     12.70 cm/s TAPSE (M-mode): 2.1 cm LEFT ATRIUM             Index        RIGHT ATRIUM           Index LA diam:        4.00 cm 2.08 cm/m   RA Area:     14.10 cm LA Vol (A2C):   56.4 ml 29.39 ml/m  RA Volume:   32.80 ml  17.09 ml/m LA Vol (A4C):   55.3 ml 28.82 ml/m LA Biplane Vol: 58.4 ml 30.44 ml/m  AORTIC VALVE AV Area (Vmax):    1.82 cm AV Area (Vmean):   2.07 cm AV Area (VTI):     1.91 cm AV Vmax:           172.00 cm/s AV Vmean:          105.000 cm/s AV VTI:            0.348 m AV Peak Grad:      11.8 mmHg AV Mean Grad:      6.0 mmHg LVOT Vmax:         123.00 cm/s LVOT Vmean:        85.600 cm/s LVOT VTI:          0.262 m LVOT/AV VTI ratio: 0.75  AORTA Ao Root diam: 3.30 cm MITRAL VALVE MV Area (PHT): 3.91 cm     SHUNTS MV Decel Time: 194 msec     Systemic VTI:  0.26 m MV E velocity: 109.00 cm/s  Systemic Diam: 1.80 cm MV A velocity: 99.00 cm/s MV E/A ratio:  1.10 Vishnu Priya Mallipeddi Electronically signed by Diannah Late Mallipeddi Signature Date/Time: 07/04/2024/3:53:59 PM    Final    CT CHEST WO CONTRAST Result Date: 07/04/2024 EXAM: CT CHEST WITHOUT CONTRAST 07/03/2024 03:00:33 PM TECHNIQUE: CT of the chest was performed without the administration of intravenous contrast. Multiplanar reformatted images are provided for review. Automated exposure control, iterative reconstruction, and/or weight based adjustment  of the mA/kV was utilized to reduce the radiation dose to as low as reasonably achievable. COMPARISON: None available. CLINICAL HISTORY: Respiratory illness, nondiagnostic xray. FINDINGS: MEDIASTINUM: Heart: Extensive coronary artery calcification. Global cardiac size within normal limits. No pericardial effusion. Vessels: Central pulmonary arteries are of normal caliber. Mild atherosclerotic calcification within the thoracic aorta. No aortic aneurysm. Central airways: The central airways are clear. LYMPH NODES: No mediastinal, hilar or axillary lymphadenopathy. LUNGS AND PLEURA: Small bilateral pleural effusions with associated bibasilar compressive  atelectasis. Trace interstitial pulmonary edema. Superimposed patchy ground glass and reticular nodular infiltrates are seen, most prevalent within the left lower lobe, suspicious for atypical infection in the appropriate clinical setting. No central obstructing lesion. No pneumothorax. SOFT TISSUES/BONES: No acute abnormality of the bones or soft tissues. UPPER ABDOMEN: Small hiatal hernia. Cirrhosis. Recanalized umbilical vein in keeping with changes of portal venous hypertension. Cholecystectomy and right nephrectomy have been performed. IMPRESSION: 1. Patchy ground-glass and reticulonodular infiltrates, suspicious for atypical infection. 2. Extensive coronary artery calcification. 3. Small bilateral pleural effusions with bibasilar compressive atelectasis. Trace interstitial pulmonary edema. Together, the findings may reflect changes of mild cardiogenic failure. 4. Cirrhosis with recanalized umbilical vein, consistent with portal hypertension. 5. Small hiatal hernia. Electronically signed by: Dorethia Molt MD 07/04/2024 02:06 AM EST RP Workstation: [id number]   CT HEAD WO CONTRAST Result Date: 07/02/2024 EXAM: CT HEAD WITHOUT CONTRAST 07/02/2024 05:58:10 AM TECHNIQUE: CT of the head was performed without the administration of intravenous contrast. Automated  exposure control, iterative reconstruction, and/or weight based adjustment of the mA/kV was utilized to reduce the radiation dose to as low as reasonably achievable. COMPARISON: Head CT 06/15/2010. CLINICAL HISTORY: 79 year old male. Fall at home. Increased confusion from baseline. FINDINGS: BRAIN AND VENTRICLES: Background brain volume remains normal for age. 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 Mild heterogeneity elsewhere in the bilateral basal ganglia (right caudate series 2 image 35 which also appears likely to be chronic). Gray white differentiation otherwise normal for age. No acute hemorrhage. No evidence of acute infarct. No hydrocephalus. No extra-axial collection. No mass effect or midline shift. Calcified atherosclerosis at the skull base. No suspicious intracranial vascular hyperdensity. ORBITS: Postoperative changes to both globes since 2011. No acute orbit injury identified. SINUSES: Trace low density fluid in the right sphenoid sinus appears inflammatory, while other visible paranasal sinuses, tympanic cavities and mastoids appear clear. SOFT TISSUES AND SKULL: No acute scalp soft tissue injury identified. No skull fracture. IMPRESSION: 1. 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 2. No acute intracranial abnormality.  No acute traumatic injury identified. Electronically signed by: Helayne Hurst MD 07/02/2024 06:04 AM EST RP Workstation: [id number]   DG Chest Port 1 View Result Date: 07/02/2024 EXAM: 1 VIEW(S) XRAY OF THE CHEST 07/02/2024 05:27:54 AM COMPARISON: Portable chest x ray 06/14/2010. CLINICAL HISTORY: 79 year old male with questionable sepsis, evaluate for abnormality. FINDINGS: LUNGS AND PLEURA: Mildly lower lung volumes. Mild diffuse, symmetric increased pulmonary interstitial markings. No consolidation. No convincing confluent lung opacity. No pleural effusion. No pneumothorax. HEART AND  MEDIASTINUM: Mediastinal contours remain normal. Negative visible trachea. BONES AND SOFT TISSUES: No acute osseous abnormality. VISIBLE ABDOMEN: Paucity of bowel gas in the visible abdomen. Cholecystectomy clips suspected. IMPRESSION: 1. Mild diffuse interstitial opacity. Favor vascular congestion / interstitial edema, but viral/atypical respiratory infection could appear similar. Electronically signed by: Helayne Hurst MD 07/02/2024 05:31 AM EST RP Workstation: [id number]    Microbiology: Results for orders placed or performed during the hospital encounter of 07/02/24  Blood culture (routine x 2)     Status: None   Collection Time: 07/02/24  5:13 AM   Specimen: BLOOD RIGHT FOREARM  Result Value Ref Range Status   Specimen Description BLOOD RIGHT FOREARM  Final   Special Requests   Final    BOTTLES DRAWN AEROBIC AND ANAEROBIC Blood Culture results may not be optimal due to an inadequate volume of blood received in culture bottles  Culture   Final    NO GROWTH 5 DAYS Performed at [facility], [address], [place], [place] [address]    Report Status 07/07/2024 FINAL  Final  Blood culture (routine x 2)     Status: None   Collection Time: 07/02/24  5:13 AM   Specimen: Left Antecubital; Blood  Result Value Ref Range Status   Specimen Description LEFT ANTECUBITAL  Final   Special Requests   Final    BOTTLES DRAWN AEROBIC AND ANAEROBIC Blood Culture results may not be optimal due to an inadequate volume of blood received in culture bottles   Culture   Final    NO GROWTH 5 DAYS Performed at [facility], [address] [place], [place] [address]    Report Status 07/07/2024 FINAL  Final  Resp panel by RT-PCR (RSV, Flu A&B, Covid) Anterior Nasal Swab     Status: Abnormal   Collection Time: 07/02/24  5:13 AM   Specimen: Anterior Nasal Swab  Result Value Ref Range Status   SARS Coronavirus 2 by RT PCR NEGATIVE NEGATIVE Final    Comment: (NOTE) SARS-CoV-2 target nucleic acids are NOT  DETECTED.  The SARS-CoV-2 RNA is generally detectable in upper respiratory specimens during the acute phase of infection. The lowest concentration of SARS-CoV-2 viral copies this assay can detect is 138 copies/mL. A negative result does not preclude SARS-Cov-2 infection and should not be used as the sole basis for treatment or other patient management decisions. A negative result may occur with  improper specimen collection/handling, submission of specimen other than nasopharyngeal swab, presence of viral mutation(s) within the areas targeted by this assay, and inadequate number of viral copies(<138 copies/mL). A negative result must be combined with clinical observations, patient history, and epidemiological information. The expected result is Negative.  Fact Sheet for Patients:  bloggercourse.com  Fact Sheet for Healthcare Providers:  seriousbroker.it  This test is no t yet approved or cleared by the [place]  FDA and  has been authorized for detection and/or diagnosis of SARS-CoV-2 by FDA under an Emergency Use Authorization (EUA). This EUA will remain  in effect (meaning this test can be used) for the duration of the COVID-19 declaration under Section 564(b)(1) of the Act, 21 U.S.C.section 360bbb-3(b)(1), unless the authorization is terminated  or revoked sooner.       Influenza A by PCR POSITIVE (A) NEGATIVE Final   Influenza B by PCR NEGATIVE NEGATIVE Final    Comment: (NOTE) The Xpert Xpress SARS-CoV-2/FLU/RSV plus assay is intended as an aid in the diagnosis of influenza from Nasopharyngeal swab specimens and should not be used as a sole basis for treatment. Nasal washings and aspirates are unacceptable for Xpert Xpress SARS-CoV-2/FLU/RSV testing.  Fact Sheet for Patients: bloggercourse.com  Fact Sheet for Healthcare Providers: seriousbroker.it  This test is not  yet approved or cleared by the [place]  FDA and has been authorized for detection and/or diagnosis of SARS-CoV-2 by FDA under an Emergency Use Authorization (EUA). This EUA will remain in effect (meaning this test can be used) for the duration of the COVID-19 declaration under Section 564(b)(1) of the Act, 21 U.S.C. section 360bbb-3(b)(1), unless the authorization is terminated or revoked.     Resp Syncytial Virus by PCR NEGATIVE NEGATIVE Final    Comment: (NOTE) Fact Sheet for Patients: bloggercourse.com  Fact Sheet for Healthcare Providers: seriousbroker.it  This test is not yet approved or cleared by the [place]  FDA and has been authorized for detection and/or diagnosis of SARS-CoV-2  by FDA under an Emergency Use Authorization (EUA). This EUA will remain in effect (meaning this test can be used) for the duration of the COVID-19 declaration under Section 564(b)(1) of the Act, 21 U.S.C. section 360bbb-3(b)(1), unless the authorization is terminated or revoked.  Performed at [facility], [address], [place], [place] [address]   Urine Culture     Status: None   Collection Time: 07/02/24  5:41 AM   Specimen: Urine, Catheterized  Result Value Ref Range Status   Specimen Description   Final    URINE, CATHETERIZED Performed at [facility], [address], [place], [place] [address]    Special Requests   Final    NONE Performed at [facility], [address] [place], [place] [address]    Culture   Final    NO GROWTH Performed at [facility], [address], [place], [place] [address]    Report Status 07/03/2024 FINAL  Final    Labs: CBC: No results for input(s): WBC, NEUTROABS, HGB, HCT, MCV, PLT in the last 168 hours. Basic Metabolic Panel: Recent Labs  Lab 07/05/24 0955 07/06/24 0509 07/09/24 0536  NA 139 137 137  K 3.6 3.7 3.6  CL 100 100 100  CO2 21* 22 30  GLUCOSE 198* 218*  164*  BUN 32* 32* 41*  CREATININE 1.69* 1.56* 1.77*  CALCIUM  8.9 8.6* 8.4*  MG  --  2.4  --     Recent Labs  Lab 07/10/24 1143 07/10/24 1605 07/10/24 2005 07/11/24 0713 07/11/24 1142  GLUCAP 179* 279* 315* 165* 179*    Discharge time spent:  35 minutes.  Signed: Eric Nunnery, MD [facility] 07/11/2024 "

## 2024-07-11 NOTE — TOC Transition Note (Signed)
 Transition of Care [facility]) - Discharge Note   Patient Details  Name: Chase Malone MRN: [id number] Date of Birth: April 15, 1946  Transition [facility] Care [facility]) CM/SW Contact:  Sharlyne Stabs, RN Phone Number: 07/11/2024, 1:03 PM   Clinical Narrative:   Shara received and patient ready for discharge to [facility]. Destiny provided a room number. RN calling report. EMS scheduled. Wife at the bedside, belonging gathered. DC summary sent in the hub.    Final next level of care: Skilled Nursing Facility Barriers to Discharge: Barriers Resolved  Patient Goals and CMS Choice Patient states their goals for this hospitalization and ongoing recovery are:: get stronger CMS Medicare.gov Compare Post Acute Care list provided to:: Patient Choice offered to / list presented to : Patient [place]  ownership interest in [facility].provided to:: Patient    Discharge Placement               Patient to be transferred to facility by: EMS Name of family member notified: wife at the bedside Patient and family notified of of transfer: 07/11/24  Discharge Plan and Services Additional resources added to the After Visit Summary for   In-house Referral: Clinical Social Work Discharge Planning Services: CM Consult Post Acute Care Choice: Skilled Nursing Facility              Social Drivers of Health (SDOH) Interventions SDOH Screenings   Food Insecurity: No Food Insecurity (07/02/2024)  Housing: Low Risk (07/02/2024)  Transportation Needs: No Transportation Needs (07/02/2024)  Utilities: Not At Risk (07/02/2024)  Social Connections: Moderately Isolated (07/02/2024)  Tobacco Use: Medium Risk (07/04/2024)    Readmission Risk Interventions    07/03/2024    1:59 PM  Readmission Risk Prevention Plan  Transportation Screening Complete  Home Care Screening Complete  Medication Review (RN CM) Complete

## 2024-07-11 NOTE — Plan of Care (Signed)

## 2024-07-11 NOTE — Progress Notes (Signed)
 Pt and wife aware of pending discharge to [facility] rehab via EMS. Pt report called to facility. MD Ricky in to see pt at this time.

## 2024-07-11 NOTE — Care Management Important Message (Signed)
 Important Message  Patient Details  Name: Chase Malone MRN: [id number] Date of Birth: 1946/04/13   Important Message Given:  Yes - Medicare IM     Ulus Hazen L Francies Inch 07/11/2024, 12:34 PM

## 2024-07-18 NOTE — Care Plan (Signed)
 [facility] AND [facility] [place] [address] [place] [address] [phone]  Physician Medicare Certification Post-Hospital SNF Care  Patient Name: Chase Malone MRN: [id number] Medicare Number: [id number] - (Medicare Advantage) Attending Provider: Toribio Jerel Area, MD Admitting Provider: Jerel Area Toribio, MD Admission Date: 07/11/2024  I certify that post-hospital SNF care is required at a skilled level on a daily basis on behalf of the above named patient that, as a practical matter, can only be provided in a SNF.  The SNF care is needed for a condition for which the individual received inpatient care in a participating hospital.  Additional justification (if applicable): Admitted to facility for skilled services following hospital stay for Acute Diastolic Heart Failure.  Skilled Rehab: yes. If yes, as documented per rehab plan of care.  Provider's Name: Jerel Toribio, MD Date: 07/18/2024 10:31 PM

## 2024-07-19 NOTE — Care Plan (Signed)
 LTC Post Acute Care Conference [facility])  07/19/2024 3:06 PM    Patient Name: Chase Malone MRN:  [id number] Admit Date/Time: 07/11/2024  2:40 PM Date of Birth:  Feb 04, 1946 Sex:  Male Room/Bed: T853/T853-98   Patient Active Problem List   Diagnosis Date Noted   Constipation 07/12/2024   Coronary atherosclerosis of native coronary artery 07/12/2024   Allergic rhinitis 07/12/2024   Muscle weakness (generalized) 07/12/2024   Cognitive communication deficit 07/12/2024   Acute diastolic heart failure (CMS-HCC) 07/11/2024   Stage 3b chronic kidney disease (CMS-HCC) 07/11/2024   Thrombocytopenia 07/11/2024   Acute respiratory failure with hypoxia    (CMS-HCC) 07/11/2024   Diabetes mellitus with no complication (CMS-HCC) 07/11/2024   Benign hypertension 07/11/2024   Mixed hyperlipidemia 07/11/2024   Pain 07/11/2024   Arthritis 07/11/2024   Depression 07/11/2024   Anxiety 07/11/2024   Indigestion 07/11/2024   Generalized weakness 07/11/2024   Gastroesophageal reflux disease 07/11/2024   Repeated falls 07/11/2024   Migraine without status migrainosus 07/11/2024   Unspecified glaucoma 07/11/2024     Anticipated discharge date:  target LCD from insurance is 07/24/24; update due to ins. 07/20/24  Team Updates    Met with resident and his wife in room today.  Resident's goal is to return home with spouse.  He was independent;  wife helped him wash his back, fixed medicine and meals.  He said he can fix some things.  He has 3 steps to enter home with railing;  no stairs inside;  has some step ups.   Resident was driving.  Reviewed insurance coverage and copays  Pharmacy:  [facility] in [place] PCP:  Dr. Vaughn Pouch, [facility] in [place]  SW gave them our contact list and list of [facility] agencies;  he would like to go to outpatient PT - [facility] is preference  Car transfer scheduled with wife tomorrow   DME:  cane, FWW, shower chair  Discharge  Planning Barriers: needs a little assist with lower body ADL dressing    Anticipated Discharge Location: home with wife   ADL: supervision with everything except min assist with LB dressing  Mobility: walking 150 feet using a FWW  Cognition / Communication: seen by speech therapy;  working on med. Mgt.    Community Reintegration: supportive wife

## 2024-07-21 NOTE — Nursing Note (Signed)
 Insurance issued a LCD and resident will go home on 07/23/24. SW made attending MD aware of discharge and outpatient PT set up with [facility] in [place].

## 2024-07-22 NOTE — LTC Provider Review (Signed)
 "    PHYSICAL THERAPY DISCHARGE SUMMARY       Patient Name:  Chase Malone      Medical Record Number: [id number]  Date of Birth: 1945-08-19  Sex: Male       [facility] Physician: Toribio Larve   PT Precautions: Falls,   OT Precautions: Falls,   SLP Precautions: Falls,    Weight Bearing: RUE Weight Bearing: No Restrictions   LUE Weight Bearing: No Restrictions   RLE Weight Bearing: No Restrictions   LLE Weight Bearing: No Restrictions    SUBJECTIVE Patient reports he feels safe to go home and is at baseline of functional mobility. Patient had no questions nor concerns.   OBJECTIVE  Time Up and Go Test: Timed Up and Go (TUG) in Seconds: (!) 28 Timed Up and Go (TUG) Assessment: mild difficulty in turning while navigating 2ww  Chair Rise reps (30 sec): 8 reps using UE support   Balance Training: Balance Training: Standing balance activity at table top with unilateral and bilateral UE's reaching outside BOS for up to 5 minutes at a time to challenge righting reactions with occasional slight LOB noted when reaching accross midline with static balance training. Toe taps onto airex pad to challenge dynamic standing balance with and without UE support. Static balance on airex pad 4x 30 seconds with LOB x 2, intermittent hand support on walker. Patient educated on safety and falls risks.  Bed Mobility:  Bed Mobility From 1: Supine Bed Mobility Type 1: To and from Bed Mobility to 1: Short sit Level of Assistance 1: Supervision Bed Mobility Comments 1: Patient needs verbal/tactile cues in scooting EOB.    Transfers:   Transfer from: Bed Transfer Type 1: To and from Transfer to 1: Wheelchair Technique 1: Sit to stand, Ambulatory Transfer Device 1: RW Transfer Level of Assistance 1: Minimal verbal cues, Supervision Trials/Comments 1: Transfer training with supervision for STS with verbal cues for hand placement and sequencing as  well as safety awareness with locking brakes and proper pre-stand positioning to improve functional mobility and decrease fall risk with transfers.    Transfer From 2: Wheelchair Transfer Type 2: To and from Transfer to 2: Chair with arms Technique 2: Stand pivot Transfer Device 2: RW Transfer Level of Assistance 2: Minimum assistance, Minimal verbal cues Trials/Comments 2: Transfer training SPT using RW with Min(A) for initial stand and Min-CG(A) for pivot with verbal cues for hand placement and sequencing as well as safety awareness with turning fully square to target surface prior to sitting to improve functional mobility and decrease fall risk with transfers.    Technique of transfer: Ambulatory Car Transfers: Contact guard Assistive device used in Car transfer: Information Systems Manager Comments: patient performed sit to stand to 2ww, ambulated 27ft to the passager side. Pt educated on backing her glutes up against seat and using hand support to rotate his LE's into the car. Patient educated on performing the reverse set up in getting out of the car. Wife present and observed transfer.    Gait:  WBAT   Surface 1: Level tile Device 1: Standard walker Assistance 1: Contact guard, Close supervision Quality of Gait 1: decrease step length, forward trunk,  narrow BOS Comments/Distance (ft) 1: Gait training in hall with RW x 250' x 1, 132ft x 1, 53ft x 1 reps with CGA, verbal cues to widen BOS due to narrow stance. Patient required freq. cues in standing upright posture and  navigating 2ww within BOS showing need for continued skilled PT intervention to increase safety with Amb.    Stairs Mobility:   Rails 1: Bilateral Device 1: No device Assistance 1: Close supervision Comment/# Steps 1: Patient completed 3 steps x 3 rounds alterntaing gait pattern with cues for turning and hand placement. CGA- min A to decrease risk of a possible fall.   ASSESSMENT Short Term Goals: SHORT GOAL #1: Patient  will demonstrate supervision with bed mobilty to increase independence and return to PLOF.  Date Established : 07/12/24 Time Frame : 2 weeks  Plan : Met   Short Term Goal #2: Patient will be able to transfer from the EOB to chair with supervision in order to improve QOL and return to PLOF.  Date Established : 07/12/24 Time Frame : 2 weeks  Plan : Met   Short Term Goal #3: Patient will be able to ambulate 67ft using 2ww with CGA and without LOB to improve functional mobility and return to PLOF.  Date Established : 07/12/24 Time Frame : 2 weeks  Plan : Met   Long Term Goals: Long Term Goal #1: Patient will demonstrate improvements in 30 sec STS from 4 reps using UE support to 8 reps to increase LE strength.  Date Established : 07/12/24 Time Frame : 4 weeks  Plan : Met   Long Term Goal #2: Patient will demonstrate improvements in TUG from 55 seconds to less than 35 seconds to reduce risk for falls and increase quality of gait.  Date Established : 07/12/24 Time Frame : 4 weeks  Plan : Met   Long Term Goal #3: Patient will be able to stand for at least 5 minutes while performing activity task to return to PLOF.  Date Established : 07/12/24 Time Frame : 4 weeks  Plan : Met   Long Term Goal #4: Patient will be able to ambulate 170ft using 2ww with SUP to increase functional mobility and improve QOL.  Date Established : 07/12/24 Time Frame : 4 weeks  Plan : Met  Analysis of Functional Outcome/Clinical Impression: Per report from patient and wife on evaluation, patient is ambulating and performing more now than PLOF. Patient demonstrates improvements in ambulation, stair training, bed mobility, endurance, strength and overall functional mobility. Patient met all goals written. He continues to be a falls risk at this time. Patient recommended to use 2ww for assistance.   Patient/ Caregiver Training: Patient and patient's wife educated on patient's functional mobility. Ongoing education  has been provided throughout sessions. Patient/wife reports no concerns in going home.   PLAN PT Plan: No skilled PT No Skilled PT: Safe to return home  PT Discharge Recommendations: Home PT, Outpatient PT  Equipment Recommended: Rolling walker    I attest that I have reviewed the above information. Signed: Rankin Lawrence, PT Filed 07/22/2024   "

## 2024-07-23 NOTE — Nursing Note (Signed)
 DISCHARGE INSTRUCTIONS GIVEN, PT AND WIFE VERBALIZED UNDERSTANDING. DISCHARGE INSTRUCTIONS GIVEN TO WIFE. PT ESCORTED TO CAR VIA STAFF AND PT LEFT GOING HOME AT 1010 .

## 2024-07-23 NOTE — Discharge Summary (Signed)
 "       Discharge Summary         Admit date:    07/11/2024 Discharge date:   07/23/2024 Length of stay:    LOS: 12 days     Discharge Attending Physician: Jerel KANDICE Sieving, MD  Discharge to:    To Home with Home Health Condition at Discharge:  good Code Status:    Full Code  Patient Care Team: Sasser, Deward ORN, MD as PCP - General (Family Medicine)  Consults       none  Discharge Diagnoses  Principal Problem:   Acute diastolic heart failure (CMS-HCC) Active Problems:   Stage 3b chronic kidney disease (CMS-HCC)   Thrombocytopenia   Acute respiratory failure with hypoxia    (CMS-HCC)   Diabetes mellitus with no complication (CMS-HCC)   Benign hypertension   Mixed hyperlipidemia   Pain   Arthritis   Depression   Anxiety   Indigestion   Generalized weakness   Gastroesophageal reflux disease   Repeated falls   Migraine without status migrainosus   Unspecified glaucoma   Constipation   Coronary atherosclerosis of native coronary artery   Allergic rhinitis   Muscle weakness (generalized)   Cognitive communication deficit   Hospital Course  This 79 year old white male with multiple medical problems was admitted after an acute hospitalization for acute diastolic congestive heart failure.  During his course of rehab he had no acute problems.  His medications were administered.  He received physical and Occupational Therapy and is now ready for discharge to home.  He will go home on his usual medications with scheduled follow-up in our office.  He is to return to the ER or call if problems occurred in the interim.  I spent less than 30 mins in the discharge of this patient.  Allergies  Allergies[1]    Last Vital sign  BP 103/59   Pulse 81   Temp 36.2 C (97.2 F) (Oral)   Resp 20   Ht 165.1 cm (5' 5)   Wt 80 kg (176 lb 4.8 oz)   SpO2 98%   BMI 29.34 kg/m    Procedures  none  Discharge Medications     Your Medication List     START taking these medications     aspirin  81 MG tablet Commonly known as: ECOTRIN Take 1 tablet (81 mg total) by mouth nightly.   cholecalciferol  (vitamin D3-50 mcg (2,000 unit)) 50 mcg (2,000 unit) tablet Take 1 tablet (50 mcg total) by mouth in the morning.   clonazePAM  1 MG tablet Commonly known as: KlonoPIN  Take 1 tablet (1 mg total) by mouth nightly.   cyanocobalamin  500 MCG tablet Take 1 tablet (500 mcg total) by mouth in the morning.   FLUoxetine  20 MG tablet Commonly known as: PROZAC  Take 1 tablet (20 mg total) by mouth in the morning.   fluticasone propion-salmeterol 250-50 mcg/dose diskus Commonly known as: ADVAIR, WIXELA Inhale 1 puff in the morning and 1 puff in the evening.   folic acid  800 MCG tablet Commonly known as: FOLVITE  Take 1 tablet (800 mcg total) by mouth daily.   furosemide  40 MG tablet Commonly known as: LASIX  Take 1 tablet (40 mg total) by mouth two (2) times a day.   metFORMIN  750 MG 24 hr tablet Commonly known as: GLUCOPHAGE -XR Take 1 tablet (750 mg total) by mouth in the morning.   omega-3 acid ethyl esters 1 gram capsule Commonly known as: LOVAZA Take 1 capsule (1 g total) by  mouth two (2) times a day.   omeprazole 40 MG capsule Commonly known as: PriLOSEC Take 2 capsules (80 mg total) by mouth daily. Start taking on: July 24, 2024   polyethylene glycol 17 gram/dose powder Commonly known as: GLYCOLAX Take 17 g by mouth in the morning.   pravastatin 40 MG tablet Commonly known as: PRAVACHOL Take 1 tablet (40 mg total) by mouth nightly.   pregabalin  150 MG capsule Commonly known as: LYRICA  Take 1 capsule (150 mg total) by mouth nightly.   pregabalin  75 MG capsule Commonly known as: LYRICA  Take 1 capsule (75 mg total) by mouth in the morning.   sumatriptan 100 MG tablet Commonly known as: IMITREX Take 1 tablet (100 mg total) by mouth daily as needed for migraine.   timolol  0.5 % ophthalmic solution Commonly known as: TIMOPTIC  Administer 1 drop to both  eyes daily.        Pending Test Results   none   Lab Results    BLOOD Recent Labs    Units 07/18/24 0552  WBC 10*9/L 9.3  HGB g/dL 87.7*  HCT % 64.2*  PLT 10*9/L 115*   Recent Labs    Units 07/18/24 1509  NA mmol/L 136  K mmol/L 4.2  CL mmol/L 97*  CO2 mmol/L 28.2  BUN mg/dL 35*  CREATININE mg/dL 7.74*  GLU mg/dL 753*  CALCIUM  mg/dL 9.0   No results for input(s): CKTOTAL, TROPONINI, TROPONINT, CKMB, EDTPNI, PROBNP, CHOL, LDL, HDL, TRIG in the last 168 hours. No results for input(s): INR, LABPROT, APTT, DDIMER in the last 168 hours. No results for input(s): TSH, ETOH, ACETAMIN, SALICYLATE, FREET3, T4FREE, A1C, ACETONE, TTR, ESR, CRP, HSCRP, ANA, RF, VITAMINB12, FOLATE, IRON, LABIRON, TIBC, FERRITIN, RETIC, RETICCTPCT, C3, C4, LDH, HAPTM, URICACID, HEPP4, CEA, RAPSCRN, CDIFRPCR, CDIFFNAP1, HIV12SCRN, HIVCP, TBCELLQN in the last 168 hours.  No results for input(s): HEPAIGM, HEPBSAG, HEPBIGM, HEPCAB, MITOAB in the last 168 hours.  URINE No results for input(s): WBCUA, NITRITE, LEUKOCYTESUR, BACTERIA, RBCUA, BLOODU, GLUCOSEU, PROTEINUA, KETONESU in the last 168 hours. No results for input(s): KETUR, PREGTESTUR, PREGPOC, NAURINE, LABOSMO, OSMOFT, PROTEINUR, CREATUR, PCRATIOUR, OPIAU, BENZU, TRICYCLIC, PCPU, AMPHU, COCAU, CANNAU, BARBU, URPROTELEC in the last 168 hours.  BODY FLUIDS No results for input(s): FTYP1, WBCFLUID, FNEUT, LYMPHSFL, FMONO, EOSFL, RBCFL, CLARITYFLUID, COLORFL in the last 168 hours.  ABG No results for input(s): O2SOUR, FIO2ART, PHART, PCO2ART, PO2ART, HCO3ART, O2SATART, BEART in the last 72 hours.  Microbiology Results (last day)     ** No results found for the last 24 hours. **       Imaging   No results found.  Discharge Instructions   Diet  Instructions   Heart Healthy Consistent Carbohydrate     Activity Instructions   Ambulate with assistance.     Other Instructions   Accuchecks twice a day to monitor sugar. Daily weights due to Congestive Heart Failure & call MD if gain more than 5lbs/wk. Zinc Oxide Cream to scrotum twice a day x 14 days stop 1/19 @ 8am. Repeat CXR 08/22/24.    Follow Up instructions and Outpatient Referrals    Ambulatory Referral to Physical Therapy     Reason for referral: Outpt Pt. P.T. post rehab   Specialty Services Requested: Other-See Comments   Suggest Treatment: Evaluation with suggestions for treatment Comment -  Gait,Strengthening,Balance   Requested follow up plan: I would resume responsibility.    Resources and Referrals   [facility] outpatient PT  Phone number:  [phone]  Jerel KANDICE Sieving, MD             [1] No Known Allergies "

## 2024-07-25 ENCOUNTER — Telehealth: Payer: Self-pay

## 2024-07-25 NOTE — Transitions of Care (Post Inpatient/ED Visit) (Signed)
" ° °  07/25/2024  Name: Chase Malone MRN: [id number] DOB: December 19, 1945  Today's TOC FU Call Status: Today's TOC FU Call Status:: Unsuccessful Call (1st Attempt) Unsuccessful Call (1st Attempt) Date: 07/25/24  Attempted to reach the patient regarding the most recent Inpatient/ED visit.  Follow Up Plan: Additional outreach attempts will be made to reach the patient to complete the Transitions of Care (Post Inpatient/ED visit) call.    Bing Edison MSN, RN RN [employer]  [facility] Office Hours M-F [phone] Direct Dial: [phone] Main Phone [phone]  Fax: [phone] .com    "
